# Patient Record
Sex: Male | Born: 1960
Health system: Southern US, Community
[De-identification: ages and names within clinical notes are randomized; demographics above are authoritative.]

## PROBLEM LIST (undated history)

## (undated) DIAGNOSIS — Z23 Encounter for immunization: Secondary | ICD-10-CM

## (undated) DIAGNOSIS — K219 Gastro-esophageal reflux disease without esophagitis: Secondary | ICD-10-CM

## (undated) DIAGNOSIS — B20 Human immunodeficiency virus [HIV] disease: Secondary | ICD-10-CM

## (undated) DIAGNOSIS — N200 Calculus of kidney: Secondary | ICD-10-CM

## (undated) DIAGNOSIS — I10 Essential (primary) hypertension: Secondary | ICD-10-CM

## (undated) DIAGNOSIS — Z951 Presence of aortocoronary bypass graft: Secondary | ICD-10-CM

## (undated) DIAGNOSIS — E7800 Pure hypercholesterolemia, unspecified: Secondary | ICD-10-CM

## (undated) DIAGNOSIS — I2510 Atherosclerotic heart disease of native coronary artery without angina pectoris: Secondary | ICD-10-CM

## (undated) DIAGNOSIS — F411 Generalized anxiety disorder: Secondary | ICD-10-CM

## (undated) HISTORY — DX: Encounter for immunization: Z23

## (undated) HISTORY — DX: Atherosclerotic heart disease of native coronary artery without angina pectoris: I25.10

## (undated) HISTORY — DX: Generalized anxiety disorder: F41.1

## (undated) HISTORY — DX: Calculus of kidney: N20.0

## (undated) HISTORY — PX: CORONARY ARTERY BYPASS GRAFT: SHX141

## (undated) HISTORY — DX: Presence of aortocoronary bypass graft: Z95.1

## (undated) HISTORY — DX: Gastro-esophageal reflux disease without esophagitis: K21.9

## (undated) HISTORY — DX: Human immunodeficiency virus (HIV) disease: B20

## (undated) HISTORY — DX: Essential (primary) hypertension: I10

## (undated) HISTORY — DX: Pure hypercholesterolemia, unspecified: E78.00

---

## 1995-07-14 ENCOUNTER — Encounter (INDEPENDENT_AMBULATORY_CARE_PROVIDER_SITE_OTHER): Payer: Self-pay | Admitting: *Deleted

## 1995-07-14 LAB — CONVERTED CEMR LAB
CD4 Count: 630 microliters
CD4 T Cell Abs: 630

## 1997-07-13 ENCOUNTER — Encounter: Admission: RE | Admit: 1997-07-13 | Discharge: 1997-07-13 | Payer: Self-pay | Admitting: Internal Medicine

## 1997-07-28 ENCOUNTER — Encounter: Admission: RE | Admit: 1997-07-28 | Discharge: 1997-07-28 | Payer: Self-pay | Admitting: Infectious Diseases

## 1997-11-10 ENCOUNTER — Encounter: Admission: RE | Admit: 1997-11-10 | Discharge: 1997-11-10 | Payer: Self-pay | Admitting: Infectious Diseases

## 1997-11-17 ENCOUNTER — Encounter: Admission: RE | Admit: 1997-11-17 | Discharge: 1997-11-17 | Payer: Self-pay | Admitting: Infectious Diseases

## 1998-01-03 ENCOUNTER — Ambulatory Visit (HOSPITAL_COMMUNITY): Admission: RE | Admit: 1998-01-03 | Discharge: 1998-01-03 | Payer: Self-pay | Admitting: Infectious Diseases

## 1998-01-03 ENCOUNTER — Encounter: Admission: RE | Admit: 1998-01-03 | Discharge: 1998-01-03 | Payer: Self-pay | Admitting: Infectious Diseases

## 1998-03-15 ENCOUNTER — Encounter: Admission: RE | Admit: 1998-03-15 | Discharge: 1998-03-15 | Payer: Self-pay | Admitting: Infectious Diseases

## 1998-09-13 ENCOUNTER — Ambulatory Visit (HOSPITAL_COMMUNITY): Admission: RE | Admit: 1998-09-13 | Discharge: 1998-09-13 | Payer: Self-pay | Admitting: Infectious Diseases

## 1999-07-01 ENCOUNTER — Encounter: Admission: RE | Admit: 1999-07-01 | Discharge: 1999-07-01 | Payer: Self-pay | Admitting: Infectious Diseases

## 1999-07-01 ENCOUNTER — Ambulatory Visit (HOSPITAL_COMMUNITY): Admission: RE | Admit: 1999-07-01 | Discharge: 1999-07-01 | Payer: Self-pay | Admitting: Infectious Diseases

## 1999-08-08 ENCOUNTER — Encounter: Admission: RE | Admit: 1999-08-08 | Discharge: 1999-08-08 | Payer: Self-pay | Admitting: Infectious Diseases

## 1999-11-13 ENCOUNTER — Encounter: Admission: RE | Admit: 1999-11-13 | Discharge: 1999-11-13 | Payer: Self-pay | Admitting: Infectious Diseases

## 1999-11-13 ENCOUNTER — Ambulatory Visit (HOSPITAL_COMMUNITY): Admission: RE | Admit: 1999-11-13 | Discharge: 1999-11-13 | Payer: Self-pay | Admitting: Infectious Diseases

## 1999-11-29 ENCOUNTER — Encounter: Admission: RE | Admit: 1999-11-29 | Discharge: 1999-11-29 | Payer: Self-pay | Admitting: Infectious Diseases

## 1999-12-30 ENCOUNTER — Inpatient Hospital Stay (HOSPITAL_COMMUNITY): Admission: EM | Admit: 1999-12-30 | Discharge: 2000-01-07 | Payer: Self-pay | Admitting: Emergency Medicine

## 1999-12-30 ENCOUNTER — Encounter: Payer: Self-pay | Admitting: Emergency Medicine

## 1999-12-31 ENCOUNTER — Encounter: Payer: Self-pay | Admitting: Cardiothoracic Surgery

## 2000-01-02 ENCOUNTER — Encounter: Payer: Self-pay | Admitting: Cardiothoracic Surgery

## 2000-01-03 ENCOUNTER — Encounter: Payer: Self-pay | Admitting: Cardiothoracic Surgery

## 2000-01-04 ENCOUNTER — Encounter: Payer: Self-pay | Admitting: Cardiothoracic Surgery

## 2000-01-06 ENCOUNTER — Encounter: Payer: Self-pay | Admitting: Cardiothoracic Surgery

## 2000-02-11 ENCOUNTER — Encounter (HOSPITAL_COMMUNITY): Admission: RE | Admit: 2000-02-11 | Discharge: 2000-05-11 | Payer: Self-pay | Admitting: Cardiovascular Disease

## 2000-04-01 ENCOUNTER — Encounter: Admission: RE | Admit: 2000-04-01 | Discharge: 2000-04-01 | Payer: Self-pay | Admitting: Infectious Diseases

## 2000-04-01 ENCOUNTER — Ambulatory Visit (HOSPITAL_COMMUNITY): Admission: RE | Admit: 2000-04-01 | Discharge: 2000-04-01 | Payer: Self-pay | Admitting: Infectious Diseases

## 2000-04-15 ENCOUNTER — Encounter: Admission: RE | Admit: 2000-04-15 | Discharge: 2000-04-15 | Payer: Self-pay | Admitting: Infectious Diseases

## 2000-09-21 ENCOUNTER — Encounter: Admission: RE | Admit: 2000-09-21 | Discharge: 2000-09-21 | Payer: Self-pay | Admitting: Infectious Diseases

## 2000-09-21 ENCOUNTER — Ambulatory Visit (HOSPITAL_COMMUNITY): Admission: RE | Admit: 2000-09-21 | Discharge: 2000-09-21 | Payer: Self-pay | Admitting: Infectious Diseases

## 2000-09-24 ENCOUNTER — Encounter: Admission: RE | Admit: 2000-09-24 | Discharge: 2000-09-24 | Payer: Self-pay | Admitting: Infectious Diseases

## 2001-05-07 ENCOUNTER — Encounter: Payer: Self-pay | Admitting: Cardiology

## 2001-05-07 ENCOUNTER — Emergency Department (HOSPITAL_COMMUNITY): Admission: EM | Admit: 2001-05-07 | Discharge: 2001-05-07 | Payer: Self-pay | Admitting: Emergency Medicine

## 2001-05-27 ENCOUNTER — Encounter: Admission: RE | Admit: 2001-05-27 | Discharge: 2001-05-27 | Payer: Self-pay | Admitting: Infectious Diseases

## 2001-05-27 ENCOUNTER — Ambulatory Visit (HOSPITAL_COMMUNITY): Admission: RE | Admit: 2001-05-27 | Discharge: 2001-05-27 | Payer: Self-pay | Admitting: Infectious Diseases

## 2001-08-25 ENCOUNTER — Encounter: Admission: RE | Admit: 2001-08-25 | Discharge: 2001-08-25 | Payer: Self-pay | Admitting: Internal Medicine

## 2001-08-25 ENCOUNTER — Ambulatory Visit (HOSPITAL_COMMUNITY): Admission: RE | Admit: 2001-08-25 | Discharge: 2001-08-25 | Payer: Self-pay | Admitting: Infectious Diseases

## 2001-09-09 ENCOUNTER — Encounter: Admission: RE | Admit: 2001-09-09 | Discharge: 2001-09-09 | Payer: Self-pay | Admitting: Infectious Diseases

## 2002-01-18 ENCOUNTER — Ambulatory Visit (HOSPITAL_COMMUNITY): Admission: RE | Admit: 2002-01-18 | Discharge: 2002-01-18 | Payer: Self-pay | Admitting: Infectious Diseases

## 2002-02-15 ENCOUNTER — Encounter: Admission: RE | Admit: 2002-02-15 | Discharge: 2002-02-15 | Payer: Self-pay | Admitting: Infectious Diseases

## 2002-03-10 HISTORY — PX: PTCA: SHX146

## 2002-05-09 ENCOUNTER — Encounter: Admission: RE | Admit: 2002-05-09 | Discharge: 2002-05-09 | Payer: Self-pay | Admitting: Infectious Diseases

## 2002-08-29 ENCOUNTER — Ambulatory Visit (HOSPITAL_COMMUNITY): Admission: RE | Admit: 2002-08-29 | Discharge: 2002-08-29 | Payer: Self-pay | Admitting: Infectious Diseases

## 2002-08-29 ENCOUNTER — Encounter: Payer: Self-pay | Admitting: Infectious Diseases

## 2002-09-23 ENCOUNTER — Encounter: Admission: RE | Admit: 2002-09-23 | Discharge: 2002-09-23 | Payer: Self-pay | Admitting: Infectious Diseases

## 2002-12-26 ENCOUNTER — Encounter: Payer: Self-pay | Admitting: Infectious Diseases

## 2002-12-26 ENCOUNTER — Encounter: Admission: RE | Admit: 2002-12-26 | Discharge: 2002-12-26 | Payer: Self-pay | Admitting: Infectious Diseases

## 2002-12-26 ENCOUNTER — Ambulatory Visit (HOSPITAL_COMMUNITY): Admission: RE | Admit: 2002-12-26 | Discharge: 2002-12-26 | Payer: Self-pay | Admitting: Infectious Diseases

## 2003-01-23 ENCOUNTER — Encounter: Admission: RE | Admit: 2003-01-23 | Discharge: 2003-01-23 | Payer: Self-pay | Admitting: Infectious Diseases

## 2003-03-27 ENCOUNTER — Encounter: Admission: RE | Admit: 2003-03-27 | Discharge: 2003-03-27 | Payer: Self-pay | Admitting: Infectious Diseases

## 2003-05-18 ENCOUNTER — Encounter: Admission: RE | Admit: 2003-05-18 | Discharge: 2003-05-18 | Payer: Self-pay | Admitting: Infectious Diseases

## 2003-10-05 ENCOUNTER — Ambulatory Visit (HOSPITAL_COMMUNITY): Admission: RE | Admit: 2003-10-05 | Discharge: 2003-10-05 | Payer: Self-pay | Admitting: Infectious Diseases

## 2003-10-05 ENCOUNTER — Encounter: Admission: RE | Admit: 2003-10-05 | Discharge: 2003-10-05 | Payer: Self-pay | Admitting: Infectious Diseases

## 2003-10-20 ENCOUNTER — Encounter: Admission: RE | Admit: 2003-10-20 | Discharge: 2003-10-20 | Payer: Self-pay | Admitting: Infectious Diseases

## 2004-02-08 ENCOUNTER — Ambulatory Visit (HOSPITAL_COMMUNITY): Admission: RE | Admit: 2004-02-08 | Discharge: 2004-02-08 | Payer: Self-pay | Admitting: Infectious Diseases

## 2004-02-08 ENCOUNTER — Ambulatory Visit: Payer: Self-pay | Admitting: Infectious Diseases

## 2004-02-22 ENCOUNTER — Ambulatory Visit: Payer: Self-pay | Admitting: Infectious Diseases

## 2004-06-24 ENCOUNTER — Ambulatory Visit: Payer: Self-pay | Admitting: Infectious Diseases

## 2004-06-24 ENCOUNTER — Ambulatory Visit: Payer: Self-pay | Admitting: Cardiovascular Disease

## 2004-06-24 ENCOUNTER — Ambulatory Visit (HOSPITAL_COMMUNITY): Admission: RE | Admit: 2004-06-24 | Discharge: 2004-06-24 | Payer: Self-pay | Admitting: Infectious Diseases

## 2004-07-01 ENCOUNTER — Ambulatory Visit: Payer: Self-pay | Admitting: Infectious Diseases

## 2004-08-06 ENCOUNTER — Ambulatory Visit: Payer: Self-pay | Admitting: Cardiovascular Disease

## 2004-08-06 ENCOUNTER — Ambulatory Visit: Payer: Self-pay

## 2004-09-17 ENCOUNTER — Encounter: Admission: RE | Admit: 2004-09-17 | Discharge: 2004-09-17 | Payer: Self-pay | Admitting: Family Medicine

## 2004-11-14 ENCOUNTER — Ambulatory Visit: Payer: Self-pay | Admitting: Infectious Diseases

## 2004-11-14 ENCOUNTER — Ambulatory Visit (HOSPITAL_COMMUNITY): Admission: RE | Admit: 2004-11-14 | Discharge: 2004-11-14 | Payer: Self-pay | Admitting: Infectious Diseases

## 2004-11-23 ENCOUNTER — Inpatient Hospital Stay (HOSPITAL_COMMUNITY): Admission: EM | Admit: 2004-11-23 | Discharge: 2004-11-25 | Payer: Self-pay | Admitting: Emergency Medicine

## 2004-11-24 ENCOUNTER — Ambulatory Visit: Payer: Self-pay | Admitting: Cardiology

## 2004-11-29 ENCOUNTER — Ambulatory Visit: Payer: Self-pay | Admitting: Infectious Diseases

## 2004-12-03 ENCOUNTER — Ambulatory Visit: Payer: Self-pay | Admitting: Cardiovascular Disease

## 2004-12-24 ENCOUNTER — Ambulatory Visit: Payer: Self-pay | Admitting: Cardiovascular Disease

## 2005-02-18 ENCOUNTER — Ambulatory Visit (HOSPITAL_COMMUNITY): Admission: EM | Admit: 2005-02-18 | Discharge: 2005-02-19 | Payer: Self-pay | Admitting: Emergency Medicine

## 2005-02-18 ENCOUNTER — Ambulatory Visit: Payer: Self-pay | Admitting: Cardiovascular Disease

## 2005-02-28 ENCOUNTER — Ambulatory Visit: Payer: Self-pay | Admitting: Internal Medicine

## 2005-03-27 ENCOUNTER — Ambulatory Visit: Payer: Self-pay | Admitting: Cardiovascular Disease

## 2005-03-27 ENCOUNTER — Ambulatory Visit: Payer: Self-pay | Admitting: Infectious Diseases

## 2005-03-28 ENCOUNTER — Ambulatory Visit (HOSPITAL_COMMUNITY): Admission: RE | Admit: 2005-03-28 | Discharge: 2005-03-28 | Payer: Self-pay | Admitting: Infectious Diseases

## 2005-05-26 ENCOUNTER — Ambulatory Visit: Payer: Self-pay | Admitting: Infectious Diseases

## 2005-06-19 ENCOUNTER — Encounter: Admission: RE | Admit: 2005-06-19 | Discharge: 2005-06-19 | Payer: Self-pay | Admitting: Infectious Diseases

## 2005-06-19 ENCOUNTER — Encounter (INDEPENDENT_AMBULATORY_CARE_PROVIDER_SITE_OTHER): Payer: Self-pay | Admitting: *Deleted

## 2005-06-19 ENCOUNTER — Ambulatory Visit: Payer: Self-pay | Admitting: Infectious Diseases

## 2005-06-19 LAB — CONVERTED CEMR LAB
CD4 Count: 1030 microliters
HIV 1 RNA Quant: 399 copies/mL

## 2005-07-01 ENCOUNTER — Ambulatory Visit: Payer: Self-pay | Admitting: Cardiovascular Disease

## 2005-07-03 ENCOUNTER — Ambulatory Visit: Payer: Self-pay | Admitting: Infectious Diseases

## 2005-08-22 ENCOUNTER — Ambulatory Visit: Payer: Self-pay | Admitting: Cardiology

## 2005-10-02 ENCOUNTER — Ambulatory Visit: Payer: Self-pay | Admitting: Infectious Diseases

## 2005-10-02 ENCOUNTER — Encounter: Admission: RE | Admit: 2005-10-02 | Discharge: 2005-10-02 | Payer: Self-pay | Admitting: Infectious Diseases

## 2005-10-02 ENCOUNTER — Encounter (INDEPENDENT_AMBULATORY_CARE_PROVIDER_SITE_OTHER): Payer: Self-pay | Admitting: *Deleted

## 2005-10-02 LAB — CONVERTED CEMR LAB
CD4 Count: 730 microliters
HIV 1 RNA Quant: 399 copies/mL

## 2005-10-16 ENCOUNTER — Ambulatory Visit: Payer: Self-pay | Admitting: Infectious Diseases

## 2005-10-28 ENCOUNTER — Ambulatory Visit (HOSPITAL_COMMUNITY): Admission: RE | Admit: 2005-10-28 | Discharge: 2005-10-28 | Payer: Self-pay | Admitting: Infectious Diseases

## 2005-12-09 ENCOUNTER — Ambulatory Visit: Payer: Self-pay

## 2006-01-06 ENCOUNTER — Ambulatory Visit: Payer: Self-pay | Admitting: Cardiovascular Disease

## 2006-02-05 ENCOUNTER — Ambulatory Visit: Payer: Self-pay | Admitting: Infectious Diseases

## 2006-03-16 ENCOUNTER — Ambulatory Visit: Payer: Self-pay | Admitting: Infectious Diseases

## 2006-03-16 ENCOUNTER — Encounter: Admission: RE | Admit: 2006-03-16 | Discharge: 2006-03-16 | Payer: Self-pay | Admitting: Infectious Diseases

## 2006-03-16 ENCOUNTER — Encounter (INDEPENDENT_AMBULATORY_CARE_PROVIDER_SITE_OTHER): Payer: Self-pay | Admitting: *Deleted

## 2006-03-16 LAB — CONVERTED CEMR LAB
ALT: 25 units/L (ref 0–53)
AST: 20 units/L (ref 0–37)
Albumin: 4.2 g/dL (ref 3.5–5.2)
Alkaline Phosphatase: 77 units/L (ref 39–117)
BUN: 14 mg/dL (ref 6–23)
Basophils Absolute: 0 10*3/uL (ref 0.0–0.1)
Basophils Relative: 0 % (ref 0–1)
Bilirubin Urine: NEGATIVE
CD4 Count: 800 microliters
CO2: 24 meq/L (ref 19–32)
Calcium: 9.8 mg/dL (ref 8.4–10.5)
Chloride: 105 meq/L (ref 96–112)
Cholesterol: 209 mg/dL — ABNORMAL HIGH (ref 0–200)
Creatinine, Ser: 0.9 mg/dL (ref 0.40–1.50)
Eosinophils Relative: 2 % (ref 0–5)
Glucose, Bld: 75 mg/dL (ref 70–99)
HCT: 46.4 % (ref 39.0–52.0)
HDL: 41 mg/dL (ref >39)
HIV 1 RNA Quant: 67 copies/mL
HIV 1 RNA Quant: 67 copies/mL — ABNORMAL HIGH (ref <50)
HIV-1 RNA Quant, Log: 1.83 — ABNORMAL HIGH (ref <1.70)
Hemoglobin, Urine: NEGATIVE
Hemoglobin: 15.8 g/dL (ref 13.0–17.0)
Ketones, ur: NEGATIVE mg/dL
LDL Cholesterol: 127 mg/dL — ABNORMAL HIGH (ref 0–99)
Leukocytes, UA: NEGATIVE
Lymphocytes Relative: 33 % (ref 12–46)
Lymphs Abs: 1.9 10*3/uL (ref 0.7–3.3)
MCHC: 34.1 g/dL (ref 30.0–36.0)
MCV: 99.8 fL (ref 78.0–100.0)
Monocytes Absolute: 0.4 10*3/uL (ref 0.2–0.7)
Monocytes Relative: 7 % (ref 3–11)
Neutro Abs: 3.4 10*3/uL (ref 1.7–7.7)
Neutrophils Relative %: 58 % (ref 43–77)
Nitrite: NEGATIVE
Platelets: 227 10*3/uL (ref 150–400)
Potassium: 4.7 meq/L (ref 3.5–5.3)
Protein, ur: NEGATIVE mg/dL
RBC: 4.65 M/uL (ref 4.22–5.81)
RDW: 13.1 % (ref 11.5–14.0)
Sodium: 141 meq/L (ref 135–145)
Specific Gravity, Urine: 1.015 (ref 1.005–1.03)
Total Bilirubin: 0.4 mg/dL (ref 0.3–1.2)
Total CHOL/HDL Ratio: 5.1
Total Protein: 7.2 g/dL (ref 6.0–8.3)
Triglycerides: 204 mg/dL — ABNORMAL HIGH (ref <150)
Urine Glucose: NEGATIVE mg/dL
Urobilinogen, UA: 0.2 (ref 0.0–1.0)
VLDL: 41 mg/dL — ABNORMAL HIGH (ref 0–40)
WBC: 5.8 10*3/uL (ref 4.0–10.5)
pH: 6.5 (ref 5.0–8.0)

## 2006-03-20 DIAGNOSIS — I2510 Atherosclerotic heart disease of native coronary artery without angina pectoris: Secondary | ICD-10-CM | POA: Insufficient documentation

## 2006-03-20 DIAGNOSIS — B20 Human immunodeficiency virus [HIV] disease: Secondary | ICD-10-CM | POA: Insufficient documentation

## 2006-03-30 ENCOUNTER — Encounter: Payer: Self-pay | Admitting: Infectious Diseases

## 2006-04-01 ENCOUNTER — Encounter: Admission: RE | Admit: 2006-04-01 | Discharge: 2006-04-01 | Payer: Self-pay | Admitting: Family Medicine

## 2006-04-16 ENCOUNTER — Ambulatory Visit: Payer: Self-pay | Admitting: Infectious Diseases

## 2006-05-04 ENCOUNTER — Encounter (INDEPENDENT_AMBULATORY_CARE_PROVIDER_SITE_OTHER): Payer: Self-pay | Admitting: *Deleted

## 2006-05-04 LAB — CONVERTED CEMR LAB

## 2006-05-17 ENCOUNTER — Encounter (INDEPENDENT_AMBULATORY_CARE_PROVIDER_SITE_OTHER): Payer: Self-pay | Admitting: *Deleted

## 2006-06-10 ENCOUNTER — Telehealth: Payer: Self-pay | Admitting: Infectious Diseases

## 2006-06-11 ENCOUNTER — Ambulatory Visit: Payer: Self-pay | Admitting: Cardiovascular Disease

## 2006-07-08 ENCOUNTER — Ambulatory Visit: Payer: Self-pay | Admitting: Infectious Diseases

## 2006-07-08 ENCOUNTER — Encounter: Admission: RE | Admit: 2006-07-08 | Discharge: 2006-07-08 | Payer: Self-pay | Admitting: Infectious Diseases

## 2006-07-08 ENCOUNTER — Encounter (INDEPENDENT_AMBULATORY_CARE_PROVIDER_SITE_OTHER): Payer: Self-pay | Admitting: *Deleted

## 2006-07-14 ENCOUNTER — Ambulatory Visit: Payer: Self-pay | Admitting: Infectious Diseases

## 2006-10-06 ENCOUNTER — Encounter: Admission: RE | Admit: 2006-10-06 | Discharge: 2006-10-06 | Payer: Self-pay | Admitting: Infectious Diseases

## 2006-10-06 ENCOUNTER — Ambulatory Visit: Payer: Self-pay | Admitting: Infectious Diseases

## 2006-10-06 LAB — CONVERTED CEMR LAB
ALT: 30 units/L (ref 0–53)
AST: 23 units/L (ref 0–37)
Albumin: 4.4 g/dL (ref 3.5–5.2)
Alkaline Phosphatase: 90 units/L (ref 39–117)
BUN: 17 mg/dL (ref 6–23)
Basophils Absolute: 0 10*3/uL (ref 0.0–0.1)
Basophils Relative: 0 % (ref 0–1)
CO2: 22 meq/L (ref 19–32)
Calcium: 9.6 mg/dL (ref 8.4–10.5)
Chloride: 103 meq/L (ref 96–112)
Creatinine, Ser: 0.98 mg/dL (ref 0.40–1.50)
Eosinophils Absolute: 0 10*3/uL (ref 0.0–0.7)
Eosinophils Relative: 0 % (ref 0–5)
Glucose, Bld: 88 mg/dL (ref 70–99)
HCT: 49.4 % (ref 39.0–52.0)
HIV 1 RNA Quant: <50 copies/mL (ref <50)
HIV-1 RNA Quant, Log: <1.7 (ref <1.70)
Hemoglobin: 17.1 g/dL — ABNORMAL HIGH (ref 13.0–17.0)
Lymphocytes Relative: 32 % (ref 12–46)
Lymphs Abs: 2.2 10*3/uL (ref 0.7–3.3)
MCHC: 34.6 g/dL (ref 30.0–36.0)
MCV: 98.4 fL (ref 78.0–100.0)
Monocytes Absolute: 0.5 10*3/uL (ref 0.2–0.7)
Monocytes Relative: 7 % (ref 3–11)
Neutro Abs: 4.3 10*3/uL (ref 1.7–7.7)
Neutrophils Relative %: 61 % (ref 43–77)
Platelets: 228 10*3/uL (ref 150–400)
Potassium: 4.6 meq/L (ref 3.5–5.3)
RBC: 5.02 M/uL (ref 4.22–5.81)
RDW: 13.1 % (ref 11.5–14.0)
Sodium: 139 meq/L (ref 135–145)
Total Bilirubin: 0.6 mg/dL (ref 0.3–1.2)
Total Protein: 7.8 g/dL (ref 6.0–8.3)
WBC: 7 10*3/uL (ref 4.0–10.5)

## 2006-10-09 ENCOUNTER — Encounter: Admission: RE | Admit: 2006-10-09 | Discharge: 2006-10-09 | Payer: Self-pay | Admitting: Family Medicine

## 2006-10-22 ENCOUNTER — Ambulatory Visit: Payer: Self-pay | Admitting: Infectious Diseases

## 2007-01-13 ENCOUNTER — Telehealth: Payer: Self-pay | Admitting: Infectious Diseases

## 2007-01-18 ENCOUNTER — Ambulatory Visit: Payer: Self-pay | Admitting: Cardiovascular Disease

## 2007-02-03 ENCOUNTER — Ambulatory Visit: Payer: Self-pay | Admitting: Internal Medicine

## 2007-02-03 ENCOUNTER — Encounter: Admission: RE | Admit: 2007-02-03 | Discharge: 2007-02-03 | Payer: Self-pay | Admitting: Infectious Diseases

## 2007-02-03 ENCOUNTER — Encounter: Payer: Self-pay | Admitting: Infectious Diseases

## 2007-02-03 LAB — CONVERTED CEMR LAB
ALT: 32 units/L (ref 0–53)
AST: 27 units/L (ref 0–37)
Albumin: 4.2 g/dL (ref 3.5–5.2)
Alkaline Phosphatase: 86 units/L (ref 39–117)
BUN: 14 mg/dL (ref 6–23)
Basophils Absolute: 0 10*3/uL (ref 0.0–0.1)
Basophils Relative: 0 % (ref 0–1)
CO2: 23 meq/L (ref 19–32)
Calcium: 9.6 mg/dL (ref 8.4–10.5)
Chloride: 104 meq/L (ref 96–112)
Creatinine, Ser: 0.93 mg/dL (ref 0.40–1.50)
Eosinophils Absolute: 0.1 10*3/uL — ABNORMAL LOW (ref 0.2–0.7)
Eosinophils Relative: 1 % (ref 0–5)
Glucose, Bld: 84 mg/dL (ref 70–99)
HCT: 49 % (ref 39.0–52.0)
HIV 1 RNA Quant: 94 copies/mL — ABNORMAL HIGH (ref <50)
HIV-1 RNA Quant, Log: 1.97 — ABNORMAL HIGH (ref <1.70)
Hemoglobin: 16.5 g/dL (ref 13.0–17.0)
Lymphocytes Relative: 28 % (ref 12–46)
Lymphs Abs: 2.9 10*3/uL (ref 0.7–4.0)
MCHC: 33.7 g/dL (ref 30.0–36.0)
MCV: 98.6 fL (ref 78.0–100.0)
Monocytes Absolute: 0.6 10*3/uL (ref 0.1–1.0)
Monocytes Relative: 6 % (ref 3–12)
Neutro Abs: 6.7 10*3/uL (ref 1.7–7.7)
Neutrophils Relative %: 65 % (ref 43–77)
Platelets: 217 10*3/uL (ref 150–400)
Potassium: 4.3 meq/L (ref 3.5–5.3)
RBC: 4.97 M/uL (ref 4.22–5.81)
RDW: 13.1 % (ref 11.5–15.5)
Sodium: 140 meq/L (ref 135–145)
Total Bilirubin: 0.4 mg/dL (ref 0.3–1.2)
Total Protein: 7.1 g/dL (ref 6.0–8.3)
WBC: 10.4 10*3/uL (ref 4.0–10.5)

## 2007-02-25 ENCOUNTER — Encounter (INDEPENDENT_AMBULATORY_CARE_PROVIDER_SITE_OTHER): Payer: Self-pay | Admitting: Licensed Clinical Social Worker

## 2007-02-25 ENCOUNTER — Ambulatory Visit: Payer: Self-pay | Admitting: Infectious Diseases

## 2007-02-26 ENCOUNTER — Encounter: Admission: RE | Admit: 2007-02-26 | Discharge: 2007-02-26 | Payer: Self-pay | Admitting: Infectious Diseases

## 2007-02-26 ENCOUNTER — Ambulatory Visit: Payer: Self-pay | Admitting: Infectious Diseases

## 2007-02-26 LAB — CONVERTED CEMR LAB
HIV 1 RNA Quant: <50 copies/mL (ref <50)
HIV-1 RNA Quant, Log: <1.7 (ref <1.70)

## 2007-03-01 ENCOUNTER — Telehealth (INDEPENDENT_AMBULATORY_CARE_PROVIDER_SITE_OTHER): Payer: Self-pay | Admitting: Licensed Clinical Social Worker

## 2007-03-09 ENCOUNTER — Encounter: Payer: Self-pay | Admitting: Infectious Diseases

## 2007-03-10 ENCOUNTER — Encounter (INDEPENDENT_AMBULATORY_CARE_PROVIDER_SITE_OTHER): Payer: Self-pay | Admitting: *Deleted

## 2007-07-16 ENCOUNTER — Telehealth: Payer: Self-pay | Admitting: Infectious Diseases

## 2007-09-09 ENCOUNTER — Ambulatory Visit: Payer: Self-pay | Admitting: Infectious Diseases

## 2007-09-09 ENCOUNTER — Encounter: Admission: RE | Admit: 2007-09-09 | Discharge: 2007-09-09 | Payer: Self-pay | Admitting: Infectious Diseases

## 2007-09-09 LAB — CONVERTED CEMR LAB
ALT: 36 units/L (ref 0–53)
AST: 26 units/L (ref 0–37)
Albumin: 4.2 g/dL (ref 3.5–5.2)
Alkaline Phosphatase: 82 units/L (ref 39–117)
BUN: 19 mg/dL (ref 6–23)
Basophils Absolute: 0 10*3/uL (ref 0.0–0.1)
Basophils Relative: 0 % (ref 0–1)
CO2: 24 meq/L (ref 19–32)
Calcium: 9.6 mg/dL (ref 8.4–10.5)
Chloride: 104 meq/L (ref 96–112)
Creatinine, Ser: 1 mg/dL (ref 0.40–1.50)
Eosinophils Absolute: 0 10*3/uL (ref 0.0–0.7)
Eosinophils Relative: 0 % (ref 0–5)
Glucose, Bld: 106 mg/dL — ABNORMAL HIGH (ref 70–99)
HCT: 45.9 % (ref 39.0–52.0)
HIV 1 RNA Quant: <50 copies/mL (ref <50)
HIV-1 RNA Quant, Log: <1.7 (ref <1.70)
Hemoglobin: 16.1 g/dL (ref 13.0–17.0)
Lymphocytes Relative: 32 % (ref 12–46)
Lymphs Abs: 2.5 10*3/uL (ref 0.7–4.0)
MCHC: 35.1 g/dL (ref 30.0–36.0)
MCV: 98.9 fL (ref 78.0–100.0)
Monocytes Absolute: 0.6 10*3/uL (ref 0.1–1.0)
Monocytes Relative: 8 % (ref 3–12)
Neutro Abs: 4.6 10*3/uL (ref 1.7–7.7)
Neutrophils Relative %: 60 % (ref 43–77)
Platelets: 239 10*3/uL (ref 150–400)
Potassium: 4.4 meq/L (ref 3.5–5.3)
RBC: 4.64 M/uL (ref 4.22–5.81)
RDW: 13.1 % (ref 11.5–15.5)
Sodium: 135 meq/L (ref 135–145)
Total Bilirubin: 0.6 mg/dL (ref 0.3–1.2)
Total Protein: 7.5 g/dL (ref 6.0–8.3)
WBC: 7.7 10*3/uL (ref 4.0–10.5)

## 2007-09-23 ENCOUNTER — Telehealth (INDEPENDENT_AMBULATORY_CARE_PROVIDER_SITE_OTHER): Payer: Self-pay | Admitting: *Deleted

## 2007-09-29 ENCOUNTER — Ambulatory Visit: Payer: Self-pay | Admitting: Infectious Diseases

## 2007-09-29 LAB — CONVERTED CEMR LAB
Bilirubin Urine: NEGATIVE
Chlamydia, Swab/Urine, PCR: NEGATIVE
GC Probe Amp, Urine: NEGATIVE
Hemoglobin, Urine: NEGATIVE
Ketones, ur: NEGATIVE mg/dL
Leukocytes, UA: NEGATIVE
Nitrite: NEGATIVE
Protein, ur: NEGATIVE mg/dL
Specific Gravity, Urine: 1.025 (ref 1.005–1.03)
Urine Glucose: NEGATIVE mg/dL
Urobilinogen, UA: 0.2 (ref 0.0–1.0)
pH: 5.5 (ref 5.0–8.0)

## 2007-10-04 ENCOUNTER — Telehealth (INDEPENDENT_AMBULATORY_CARE_PROVIDER_SITE_OTHER): Payer: Self-pay | Admitting: *Deleted

## 2007-11-02 ENCOUNTER — Ambulatory Visit: Payer: Self-pay | Admitting: Cardiovascular Disease

## 2007-11-04 ENCOUNTER — Telehealth (INDEPENDENT_AMBULATORY_CARE_PROVIDER_SITE_OTHER): Payer: Self-pay | Admitting: *Deleted

## 2008-02-29 ENCOUNTER — Ambulatory Visit: Payer: Self-pay | Admitting: Infectious Diseases

## 2008-02-29 LAB — CONVERTED CEMR LAB
ALT: 29 units/L (ref 0–53)
AST: 21 units/L (ref 0–37)
Albumin: 4.1 g/dL (ref 3.5–5.2)
Alkaline Phosphatase: 91 units/L (ref 39–117)
BUN: 13 mg/dL (ref 6–23)
Basophils Absolute: 0 10*3/uL (ref 0.0–0.1)
Basophils Relative: 0 % (ref 0–1)
CO2: 22 meq/L (ref 19–32)
Calcium: 9 mg/dL (ref 8.4–10.5)
Chloride: 101 meq/L (ref 96–112)
Creatinine, Ser: 0.85 mg/dL (ref 0.40–1.50)
Eosinophils Absolute: 0 10*3/uL (ref 0.0–0.7)
Eosinophils Relative: 0 % (ref 0–5)
Glucose, Bld: 87 mg/dL (ref 70–99)
HCT: 48.4 % (ref 39.0–52.0)
HIV 1 RNA Quant: 143 copies/mL — ABNORMAL HIGH (ref <48)
HIV-1 RNA Quant, Log: 2.16 — ABNORMAL HIGH (ref <1.68)
Hemoglobin: 17 g/dL (ref 13.0–17.0)
Hep A Total Ab: NEGATIVE
Lymphocytes Relative: 34 % (ref 12–46)
Lymphs Abs: 2.8 10*3/uL (ref 0.7–4.0)
MCHC: 35.1 g/dL (ref 30.0–36.0)
MCV: 97.8 fL (ref 78.0–100.0)
Monocytes Absolute: 0.8 10*3/uL (ref 0.1–1.0)
Monocytes Relative: 9 % (ref 3–12)
Neutro Abs: 4.6 10*3/uL (ref 1.7–7.7)
Neutrophils Relative %: 56 % (ref 43–77)
Platelets: 238 10*3/uL (ref 150–400)
Potassium: 4.7 meq/L (ref 3.5–5.3)
RBC: 4.95 M/uL (ref 4.22–5.81)
RDW: 12.9 % (ref 11.5–15.5)
Sodium: 137 meq/L (ref 135–145)
Total Bilirubin: 0.5 mg/dL (ref 0.3–1.2)
Total Protein: 7.2 g/dL (ref 6.0–8.3)
WBC: 8.3 10*3/uL (ref 4.0–10.5)

## 2008-03-16 ENCOUNTER — Ambulatory Visit: Payer: Self-pay | Admitting: Infectious Diseases

## 2008-08-17 ENCOUNTER — Ambulatory Visit: Payer: Self-pay | Admitting: Infectious Diseases

## 2008-08-17 LAB — CONVERTED CEMR LAB
ALT: 49 units/L (ref 0–53)
AST: 41 units/L — ABNORMAL HIGH (ref 0–37)
Albumin: 4.3 g/dL (ref 3.5–5.2)
Alkaline Phosphatase: 81 units/L (ref 39–117)
BUN: 12 mg/dL (ref 6–23)
Basophils Absolute: 0 10*3/uL (ref 0.0–0.1)
Basophils Relative: 0 % (ref 0–1)
CO2: 23 meq/L (ref 19–32)
Calcium: 9.3 mg/dL (ref 8.4–10.5)
Chloride: 105 meq/L (ref 96–112)
Cholesterol: 200 mg/dL (ref 0–200)
Creatinine, Ser: 0.89 mg/dL (ref 0.40–1.50)
Eosinophils Absolute: 0 10*3/uL (ref 0.0–0.7)
Eosinophils Relative: 1 % (ref 0–5)
GFR calc Af Amer: >60 mL/min (ref >60)
GFR calc non Af Amer: >60 mL/min (ref >60)
Glucose, Bld: 92 mg/dL (ref 70–99)
HCT: 45.6 % (ref 39.0–52.0)
HDL: 52 mg/dL (ref >39)
HIV 1 RNA Quant: 81 copies/mL — ABNORMAL HIGH (ref <48)
HIV-1 RNA Quant, Log: 1.91 — ABNORMAL HIGH (ref <1.68)
Hemoglobin: 16.6 g/dL (ref 13.0–17.0)
LDL Cholesterol: 107 mg/dL — ABNORMAL HIGH (ref 0–99)
Lymphocytes Relative: 29 % (ref 12–46)
Lymphs Abs: 2.1 10*3/uL (ref 0.7–4.0)
MCHC: 36.4 g/dL — ABNORMAL HIGH (ref 30.0–36.0)
MCV: 96.4 fL (ref 78.0–100.0)
Monocytes Absolute: 0.5 10*3/uL (ref 0.1–1.0)
Monocytes Relative: 7 % (ref 3–12)
Neutro Abs: 4.6 10*3/uL (ref 1.7–7.7)
Neutrophils Relative %: 63 % (ref 43–77)
Platelets: 218 10*3/uL (ref 150–400)
Potassium: 5 meq/L (ref 3.5–5.3)
RBC: 4.73 M/uL (ref 4.22–5.81)
RDW: 13 % (ref 11.5–15.5)
Sodium: 138 meq/L (ref 135–145)
Total Bilirubin: 0.5 mg/dL (ref 0.3–1.2)
Total CHOL/HDL Ratio: 3.8
Total Protein: 7.4 g/dL (ref 6.0–8.3)
Triglycerides: 206 mg/dL — ABNORMAL HIGH (ref <150)
VLDL: 41 mg/dL — ABNORMAL HIGH (ref 0–40)
WBC: 7.2 10*3/uL (ref 4.0–10.5)

## 2008-09-06 ENCOUNTER — Telehealth (INDEPENDENT_AMBULATORY_CARE_PROVIDER_SITE_OTHER): Payer: Self-pay | Admitting: *Deleted

## 2008-10-03 ENCOUNTER — Telehealth (INDEPENDENT_AMBULATORY_CARE_PROVIDER_SITE_OTHER): Payer: Self-pay | Admitting: *Deleted

## 2008-11-03 DIAGNOSIS — E7800 Pure hypercholesterolemia, unspecified: Secondary | ICD-10-CM | POA: Insufficient documentation

## 2008-11-03 DIAGNOSIS — N200 Calculus of kidney: Secondary | ICD-10-CM | POA: Insufficient documentation

## 2008-11-03 DIAGNOSIS — F411 Generalized anxiety disorder: Secondary | ICD-10-CM | POA: Insufficient documentation

## 2008-11-03 DIAGNOSIS — I10 Essential (primary) hypertension: Secondary | ICD-10-CM | POA: Insufficient documentation

## 2008-11-03 DIAGNOSIS — K219 Gastro-esophageal reflux disease without esophagitis: Secondary | ICD-10-CM | POA: Insufficient documentation

## 2008-11-06 ENCOUNTER — Ambulatory Visit: Payer: Self-pay | Admitting: Cardiovascular Disease

## 2008-11-06 ENCOUNTER — Telehealth: Payer: Self-pay | Admitting: Infectious Diseases

## 2008-11-06 DIAGNOSIS — R05 Cough: Secondary | ICD-10-CM | POA: Insufficient documentation

## 2008-11-06 DIAGNOSIS — F17200 Nicotine dependence, unspecified, uncomplicated: Secondary | ICD-10-CM | POA: Insufficient documentation

## 2008-11-06 DIAGNOSIS — R059 Cough, unspecified: Secondary | ICD-10-CM | POA: Insufficient documentation

## 2008-11-29 ENCOUNTER — Ambulatory Visit: Payer: Self-pay | Admitting: Infectious Diseases

## 2008-11-29 LAB — CONVERTED CEMR LAB
ALT: 33 units/L (ref 0–53)
AST: 25 units/L (ref 0–37)
Albumin: 4.6 g/dL (ref 3.5–5.2)
Alkaline Phosphatase: 86 units/L (ref 39–117)
BUN: 13 mg/dL (ref 6–23)
Basophils Absolute: 0 10*3/uL (ref 0.0–0.1)
Basophils Relative: 0 % (ref 0–1)
CO2: 24 meq/L (ref 19–32)
Calcium: 9.9 mg/dL (ref 8.4–10.5)
Chloride: 102 meq/L (ref 96–112)
Creatinine, Ser: 0.94 mg/dL (ref 0.40–1.50)
Eosinophils Absolute: 0.1 10*3/uL (ref 0.0–0.7)
Eosinophils Relative: 1 % (ref 0–5)
Glucose, Bld: 119 mg/dL — ABNORMAL HIGH (ref 70–99)
HCT: 48.9 % (ref 39.0–52.0)
HIV 1 RNA Quant: 149 copies/mL — ABNORMAL HIGH (ref <48)
HIV-1 RNA Quant, Log: 2.17 — ABNORMAL HIGH (ref <1.68)
Hemoglobin: 17.1 g/dL — ABNORMAL HIGH (ref 13.0–17.0)
Lymphocytes Relative: 26 % (ref 12–46)
Lymphs Abs: 2.1 10*3/uL (ref 0.7–4.0)
MCHC: 35 g/dL (ref 30.0–36.0)
MCV: 101.2 fL — ABNORMAL HIGH (ref >=78.0)
Monocytes Absolute: 0.5 10*3/uL (ref 0.1–1.0)
Monocytes Relative: 6 % (ref 3–12)
Neutro Abs: 5.4 10*3/uL (ref 1.7–7.7)
Neutrophils Relative %: 67 % (ref 43–77)
Platelets: 225 10*3/uL (ref 150–400)
Potassium: 5 meq/L (ref 3.5–5.3)
RBC: 4.83 M/uL (ref 4.22–5.81)
RDW: 12.7 % (ref 11.5–15.5)
Sodium: 138 meq/L (ref 135–145)
Total Bilirubin: 0.6 mg/dL (ref 0.3–1.2)
Total Protein: 7.3 g/dL (ref 6.0–8.3)
WBC: 8 10*3/uL (ref 4.0–10.5)

## 2008-12-07 ENCOUNTER — Encounter: Payer: Self-pay | Admitting: Infectious Diseases

## 2008-12-11 ENCOUNTER — Telehealth: Payer: Self-pay

## 2008-12-21 ENCOUNTER — Telehealth (INDEPENDENT_AMBULATORY_CARE_PROVIDER_SITE_OTHER): Payer: Self-pay | Admitting: *Deleted

## 2008-12-21 ENCOUNTER — Ambulatory Visit: Payer: Self-pay | Admitting: Internal Medicine

## 2009-03-15 ENCOUNTER — Encounter (INDEPENDENT_AMBULATORY_CARE_PROVIDER_SITE_OTHER): Payer: Self-pay | Admitting: *Deleted

## 2009-03-23 ENCOUNTER — Ambulatory Visit: Payer: Self-pay | Admitting: Infectious Diseases

## 2009-03-23 LAB — CONVERTED CEMR LAB
ALT: 34 units/L (ref 0–53)
AST: 22 units/L (ref 0–37)
Albumin: 4.1 g/dL (ref 3.5–5.2)
Alkaline Phosphatase: 73 units/L (ref 39–117)
BUN: 13 mg/dL (ref 6–23)
Basophils Absolute: 0 10*3/uL (ref 0.0–0.1)
Basophils Relative: 0 % (ref 0–1)
CO2: 24 meq/L (ref 19–32)
Calcium: 9.2 mg/dL (ref 8.4–10.5)
Chloride: 103 meq/L (ref 96–112)
Cholesterol: 202 mg/dL — ABNORMAL HIGH (ref 0–200)
Creatinine, Ser: 0.83 mg/dL (ref 0.40–1.50)
Eosinophils Absolute: 0 10*3/uL (ref 0.0–0.7)
Eosinophils Relative: 1 % (ref 0–5)
Glucose, Bld: 116 mg/dL — ABNORMAL HIGH (ref 70–99)
HCT: 49.3 % (ref 39.0–52.0)
HDL: 51 mg/dL (ref >39)
HIV 1 RNA Quant: 54 copies/mL — ABNORMAL HIGH (ref <48)
HIV-1 RNA Quant, Log: 1.73 — ABNORMAL HIGH (ref <1.68)
Hemoglobin: 17.2 g/dL — ABNORMAL HIGH (ref 13.0–17.0)
LDL Cholesterol: 105 mg/dL — ABNORMAL HIGH (ref 0–99)
Lymphocytes Relative: 29 % (ref 12–46)
Lymphs Abs: 2 10*3/uL (ref 0.7–4.0)
MCHC: 34.9 g/dL (ref 30.0–36.0)
MCV: 102.1 fL — ABNORMAL HIGH (ref >=78.0)
Monocytes Absolute: 0.5 10*3/uL (ref 0.1–1.0)
Monocytes Relative: 7 % (ref 3–12)
Neutro Abs: 4.5 10*3/uL (ref 1.7–7.7)
Neutrophils Relative %: 64 % (ref 43–77)
Platelets: 222 10*3/uL (ref 150–400)
Potassium: 4.6 meq/L (ref 3.5–5.3)
RBC: 4.83 M/uL (ref 4.22–5.81)
RDW: 12.8 % (ref 11.5–15.5)
Sodium: 138 meq/L (ref 135–145)
Total Bilirubin: 0.5 mg/dL (ref 0.3–1.2)
Total CHOL/HDL Ratio: 4
Total Protein: 6.8 g/dL (ref 6.0–8.3)
Triglycerides: 232 mg/dL — ABNORMAL HIGH (ref <150)
VLDL: 46 mg/dL — ABNORMAL HIGH (ref 0–40)
WBC: 7.1 10*3/uL (ref 4.0–10.5)

## 2009-03-29 ENCOUNTER — Ambulatory Visit: Payer: Self-pay | Admitting: Infectious Diseases

## 2009-07-05 ENCOUNTER — Telehealth (INDEPENDENT_AMBULATORY_CARE_PROVIDER_SITE_OTHER): Payer: Self-pay | Admitting: *Deleted

## 2009-08-14 ENCOUNTER — Ambulatory Visit: Payer: Self-pay | Admitting: Infectious Diseases

## 2009-08-14 LAB — CONVERTED CEMR LAB
ALT: 30 units/L (ref 0–53)
AST: 31 units/L (ref 0–37)
Albumin: 4.4 g/dL (ref 3.5–5.2)
Alkaline Phosphatase: 91 units/L (ref 39–117)
BUN: 16 mg/dL (ref 6–23)
Basophils Absolute: 0 10*3/uL (ref 0.0–0.1)
Basophils Relative: 0 % (ref 0–1)
CO2: 19 meq/L (ref 19–32)
Calcium: 9.7 mg/dL (ref 8.4–10.5)
Chloride: 99 meq/L (ref 96–112)
Creatinine, Ser: 0.88 mg/dL (ref 0.40–1.50)
Eosinophils Absolute: 0 10*3/uL (ref 0.0–0.7)
Eosinophils Relative: 1 % (ref 0–5)
Glucose, Bld: 92 mg/dL (ref 70–99)
HCT: 49.6 % (ref 39.0–52.0)
HIV 1 RNA Quant: <48 copies/mL (ref <48)
HIV-1 RNA Quant, Log: <1.68 (ref <1.68)
Hemoglobin: 17.5 g/dL — ABNORMAL HIGH (ref 13.0–17.0)
Lymphocytes Relative: 26 % (ref 12–46)
Lymphs Abs: 2.2 10*3/uL (ref 0.7–4.0)
MCHC: 35.3 g/dL (ref 30.0–36.0)
MCV: 101.2 fL — ABNORMAL HIGH (ref 78.0–100.0)
Monocytes Absolute: 0.6 10*3/uL (ref 0.1–1.0)
Monocytes Relative: 7 % (ref 3–12)
Neutro Abs: 5.4 10*3/uL (ref 1.7–7.7)
Neutrophils Relative %: 66 % (ref 43–77)
Platelets: 248 10*3/uL (ref 150–400)
Potassium: 4.5 meq/L (ref 3.5–5.3)
RBC: 4.9 M/uL (ref 4.22–5.81)
RDW: 12.7 % (ref 11.5–15.5)
Sodium: 133 meq/L — ABNORMAL LOW (ref 135–145)
Total Bilirubin: 0.6 mg/dL (ref 0.3–1.2)
Total Protein: 7.7 g/dL (ref 6.0–8.3)
WBC: 8.2 10*3/uL (ref 4.0–10.5)

## 2009-08-16 ENCOUNTER — Telehealth (INDEPENDENT_AMBULATORY_CARE_PROVIDER_SITE_OTHER): Payer: Self-pay | Admitting: *Deleted

## 2009-08-30 ENCOUNTER — Ambulatory Visit: Payer: Self-pay | Admitting: Infectious Diseases

## 2009-09-07 ENCOUNTER — Encounter (INDEPENDENT_AMBULATORY_CARE_PROVIDER_SITE_OTHER): Payer: Self-pay | Admitting: *Deleted

## 2009-09-26 ENCOUNTER — Telehealth (INDEPENDENT_AMBULATORY_CARE_PROVIDER_SITE_OTHER): Payer: Self-pay | Admitting: *Deleted

## 2009-12-19 ENCOUNTER — Inpatient Hospital Stay (HOSPITAL_COMMUNITY): Admission: EM | Admit: 2009-12-19 | Discharge: 2010-01-02 | Payer: Self-pay | Admitting: Emergency Medicine

## 2009-12-19 ENCOUNTER — Encounter: Payer: Self-pay | Admitting: Cardiovascular Disease

## 2009-12-19 ENCOUNTER — Encounter (INDEPENDENT_AMBULATORY_CARE_PROVIDER_SITE_OTHER): Payer: Self-pay | Admitting: Cardiology

## 2009-12-19 ENCOUNTER — Ambulatory Visit: Payer: Self-pay | Admitting: Thoracic Surgery (Cardiothoracic Vascular Surgery)

## 2009-12-19 ENCOUNTER — Ambulatory Visit: Payer: Self-pay | Admitting: Cardiovascular Disease

## 2009-12-20 ENCOUNTER — Encounter: Payer: Self-pay | Admitting: Thoracic Surgery (Cardiothoracic Vascular Surgery)

## 2009-12-28 ENCOUNTER — Encounter: Payer: Self-pay | Admitting: Cardiovascular Disease

## 2009-12-28 ENCOUNTER — Encounter: Payer: Self-pay | Admitting: Cardiothoracic Surgery

## 2009-12-28 DIAGNOSIS — Z951 Presence of aortocoronary bypass graft: Secondary | ICD-10-CM | POA: Insufficient documentation

## 2009-12-28 HISTORY — DX: Presence of aortocoronary bypass graft: Z95.1

## 2010-01-01 ENCOUNTER — Encounter: Payer: Self-pay | Admitting: Cardiovascular Disease

## 2010-01-02 ENCOUNTER — Encounter: Payer: Self-pay | Admitting: Cardiovascular Disease

## 2010-01-07 ENCOUNTER — Encounter: Payer: Self-pay | Admitting: Cardiovascular Disease

## 2010-01-11 ENCOUNTER — Ambulatory Visit: Payer: Self-pay | Admitting: Cardiothoracic Surgery

## 2010-01-23 ENCOUNTER — Ambulatory Visit: Payer: Self-pay | Admitting: Infectious Diseases

## 2010-01-23 ENCOUNTER — Encounter: Admission: RE | Admit: 2010-01-23 | Discharge: 2010-01-23 | Payer: Self-pay | Admitting: Cardiothoracic Surgery

## 2010-01-23 ENCOUNTER — Encounter: Payer: Self-pay | Admitting: Cardiovascular Disease

## 2010-01-23 ENCOUNTER — Ambulatory Visit: Payer: Self-pay | Admitting: Cardiothoracic Surgery

## 2010-01-23 LAB — CONVERTED CEMR LAB
ALT: 20 units/L (ref 0–53)
AST: 17 units/L (ref 0–37)
Albumin: 4.1 g/dL (ref 3.5–5.2)
Alkaline Phosphatase: 104 units/L (ref 39–117)
BUN: 11 mg/dL (ref 6–23)
Basophils Absolute: 0 10*3/uL (ref 0.0–0.1)
Basophils Relative: 1 % (ref 0–1)
CO2: 24 meq/L (ref 19–32)
Calcium: 9.3 mg/dL (ref 8.4–10.5)
Chloride: 104 meq/L (ref 96–112)
Creatinine, Ser: 0.82 mg/dL (ref 0.40–1.50)
Eosinophils Absolute: 0.1 10*3/uL (ref 0.0–0.7)
Eosinophils Relative: 1 % (ref 0–5)
Glucose, Bld: 113 mg/dL — ABNORMAL HIGH (ref 70–99)
HCT: 37 % — ABNORMAL LOW (ref 39.0–52.0)
HIV 1 RNA Quant: <20 copies/mL (ref <20)
HIV-1 RNA Quant, Log: <1.3 (ref <1.30)
Hemoglobin: 12.1 g/dL — ABNORMAL LOW (ref 13.0–17.0)
Lymphocytes Relative: 35 % (ref 12–46)
Lymphs Abs: 2.9 10*3/uL (ref 0.7–4.0)
MCHC: 32.7 g/dL (ref 30.0–36.0)
MCV: 100.3 fL — ABNORMAL HIGH (ref 78.0–100.0)
Monocytes Absolute: 0.5 10*3/uL (ref 0.1–1.0)
Monocytes Relative: 6 % (ref 3–12)
Neutro Abs: 4.7 10*3/uL (ref 1.7–7.7)
Neutrophils Relative %: 57 % (ref 43–77)
Platelets: 321 10*3/uL (ref 150–400)
Potassium: 4.5 meq/L (ref 3.5–5.3)
RBC: 3.69 M/uL — ABNORMAL LOW (ref 4.22–5.81)
RDW: 13.7 % (ref 11.5–15.5)
Sodium: 139 meq/L (ref 135–145)
Total Bilirubin: 0.3 mg/dL (ref 0.3–1.2)
Total Protein: 6.8 g/dL (ref 6.0–8.3)
WBC: 8.1 10*3/uL (ref 4.0–10.5)

## 2010-01-25 ENCOUNTER — Encounter: Payer: Self-pay | Admitting: Cardiology

## 2010-01-25 ENCOUNTER — Ambulatory Visit: Payer: Self-pay | Admitting: Cardiovascular Disease

## 2010-01-29 ENCOUNTER — Encounter (HOSPITAL_COMMUNITY)
Admission: RE | Admit: 2010-01-29 | Discharge: 2010-03-09 | Payer: Self-pay | Source: Home / Self Care | Attending: Cardiovascular Disease | Admitting: Cardiovascular Disease

## 2010-02-07 ENCOUNTER — Ambulatory Visit: Payer: Self-pay | Admitting: Infectious Diseases

## 2010-02-14 ENCOUNTER — Encounter: Payer: Self-pay | Admitting: Cardiovascular Disease

## 2010-02-20 ENCOUNTER — Encounter: Payer: Self-pay | Admitting: Cardiovascular Disease

## 2010-03-06 ENCOUNTER — Encounter: Payer: Self-pay | Admitting: Infectious Diseases

## 2010-03-18 ENCOUNTER — Telehealth: Payer: Self-pay | Admitting: Infectious Diseases

## 2010-03-31 ENCOUNTER — Encounter: Payer: Self-pay | Admitting: Infectious Diseases

## 2010-04-01 ENCOUNTER — Telehealth (INDEPENDENT_AMBULATORY_CARE_PROVIDER_SITE_OTHER): Payer: Self-pay | Admitting: *Deleted

## 2010-04-07 LAB — CONVERTED CEMR LAB
ALT: 34 units/L (ref 0–53)
AST: 29 units/L (ref 0–37)
Albumin: 4.6 g/dL (ref 3.5–5.2)
Alkaline Phosphatase: 92 units/L (ref 39–117)
BUN: 17 mg/dL (ref 6–23)
Basophils Absolute: 0 10*3/uL (ref 0.0–0.1)
Basophils Relative: 0 % (ref 0–1)
CD4 Count: 1140 microliters
CO2: 21 meq/L (ref 19–32)
Calcium: 9.5 mg/dL (ref 8.4–10.5)
Chloride: 103 meq/L (ref 96–112)
Creatinine, Ser: 1.06 mg/dL (ref 0.40–1.50)
Eosinophils Absolute: 0.1 10*3/uL (ref 0.0–0.7)
Eosinophils Relative: 1 % (ref 0–5)
Glucose, Bld: 88 mg/dL (ref 70–99)
HCT: 49.9 % (ref 39.0–52.0)
HIV 1 RNA Quant: 55 copies/mL — ABNORMAL HIGH (ref <50)
HIV-1 RNA Quant, Log: 1.74 — ABNORMAL HIGH (ref <1.70)
Hemoglobin: 17.7 g/dL — ABNORMAL HIGH (ref 13.0–17.0)
Lymphocytes Relative: 31 % (ref 12–46)
Lymphs Abs: 3.2 10*3/uL (ref 0.7–3.3)
MCHC: 35.5 g/dL (ref 30.0–36.0)
MCV: 94.9 fL (ref 78.0–100.0)
Monocytes Absolute: 0.5 10*3/uL (ref 0.2–0.7)
Monocytes Relative: 5 % (ref 3–11)
Neutro Abs: 6.4 10*3/uL (ref 1.7–7.7)
Neutrophils Relative %: 63 % (ref 43–77)
Platelets: 246 10*3/uL (ref 150–400)
Potassium: 4.6 meq/L (ref 3.5–5.3)
RBC: 5.26 M/uL (ref 4.22–5.81)
RDW: 13.3 % (ref 11.5–14.0)
Sodium: 138 meq/L (ref 135–145)
Total Bilirubin: 0.6 mg/dL (ref 0.3–1.2)
Total Protein: 7.7 g/dL (ref 6.0–8.3)
WBC: 10.2 10*3/uL (ref 4.0–10.5)

## 2010-04-09 NOTE — Assessment & Plan Note (Signed)
Summary: F/U OV/VS   CC:  f/u .  History of Present Illness: [name] Smith had redo CABG a month and a half ago and is slowly recovering.He has not returned to work yet and worries he may not be able to work fulltime. He is getting support and advice on this. HIs HIV is under superb control with HIV <20 copies/.and CD4 900. Will continue Atripla and revaccinate for influenza.   Preventive Screening-Counseling & Management  Alcohol-Tobacco     Alcohol drinks/day: 4     Alcohol type: beer     Smoking Status: current     Smoking Cessation Counseling: yes     Smoke Cessation Stage: contemplative     Packs/Day: 1     Cans of tobacco/week: no     Passive Smoke Exposure: yes   Current Allergies (reviewed today): No known allergies  Vital Signs:  Patient profile:   50 year old male Height:      69 inches (175.26 cm) Weight:      161.50 pounds (73.41 kg) BMI:     23.94 Pulse rate:   82 / minute BP sitting:   131 / 85  (left arm)  Vitals Entered By: Starleen Arms CMA (February 07, 2010 10:48 AM)  CC: f/u  Is Patient Diabetic? No Pain Assessment Patient in pain? no      Nutritional Status BMI of 19 -24 = normal Nutritional Status Detail not eating well  Does patient need assistance? Functional Status Self care Ambulation Normal   Physical Exam  General:  Well-developed,well-nourished,in no acute distress; alert,appropriate and cooperative throughout examination Mouth:  good dentition and no gingival abnormalities.   Lungs:  normal respiratory effort and normal breath sounds.   Heart:  normal rate, regular rhythm, and no murmur.     Impression & Recommendations:  Problem # 1:  HIV INFECTION (ICD-042) Assessment Improved  f/u in 4 monthsDoing very well and will continue current regimen.  Orders: Est. Patient Level III (16109)   Immunization History:  Influenza Immunization History:    Influenza:  fluvax non-mcr (12/19/2009)

## 2010-04-09 NOTE — Progress Notes (Signed)
  Phone Note Outgoing Call   Call placed by: Starleen Arms,  March 01, 2007 10:56 AM Call placed to: Patient Action Taken: Phone Call Completed Summary of Call: Patient was concerned that last test result for cd4 was very low, test was done again because of the difference in cd4 counts and the new count was 950. Pt advised of test results.  Initial call taken by: Starleen Arms,  March 02, 2007 2:16 PM

## 2010-04-09 NOTE — Assessment & Plan Note (Signed)
Summary: f1y/dm      Allergies Added: NKDA  CC:  no complaints.  History of Present Illness: [name] Smith is seen today for CAD and increased cholesterol.  He has CABG in 2001 and stenting of the native RCA in 2006.  He has not had any SSCP, palpitaotions, or syncope.  He has some SOB but continues to smoke.  He is interested in a [employer] study and I encouraged him to look into it since he failed Chantix.  He has not had a CXR in a year and we will order one today.  He fell off the dock this weekend and has a sore back.  Otherwise he continues to see Dr. Maurice March for HIV.  His lab counts including T4 were good.    Current Problems (verified): 1)  Cough  (ICD-786.2) 2)  Hypertension  (ICD-401.9) 3)  Hx of Coronary Artery Bypass Graft, Three Vessel, Hx of  (ICD-V45.81) 4)  Coronary Artery Disease  (ICD-414.00) 5)  Hypercholesterolemia  (ICD-272.0) 6)  Need Prophylactic Vaccination&inoculation Flu  (ICD-V04.81) 7)  HIV Infection  (ICD-042) 8)  Nephrolithiasis  (ICD-592.0) 9)  Gerd  (ICD-530.81) 10)  Anxiety  (ICD-300.00)  Current Medications (verified): 1)  Aspirin 81 Mg Tbec (Aspirin) .... Once Daily 2)  Atenolol 25 Mg Tabs (Atenolol) .... Take 1 Tablet By Mouth Once A Day 3)  Crestor 40 Mg Tabs (Rosuvastatin Calcium) .... Once Daily Tab 4)  Plavix 75 Mg Tabs (Clopidogrel Bisulfate) .... Once Daily Tab 5)  Atripla 600-200-300 Mg Tabs (Efavirenz-Emtricitab-Tenofovir) .... Take 1 Tablet By Mouth Once A Day 6)  Fluoxetine Hcl 10 Mg Caps (Fluoxetine Hcl) .... Take 1 Capsule By Mouth Once A Day 7)  Nitroglycerin 0.4 Mg Subl (Nitroglycerin) .... One Tablet Under Tongue Every 5 Minutes As Needed For Chest Pain---May Repeat Times Three  Allergies (verified): No Known Drug Allergies  Past History:  Past Medical History: Last updated: 11/03/2008 Current Problems:  HYPERTENSION (ICD-401.9) Hx of CORONARY ARTERY BYPASS GRAFT, THREE VESSEL, HX OF (ICD-V45.81) CORONARY ARTERY DISEASE  (ICD-414.00) HYPERCHOLESTEROLEMIA (ICD-272.0) NEED PROPHYLACTIC VACCINATION&INOCULATION FLU (ICD-V04.81) HIV INFECTION (ICD-042) NEPHROLITHIASIS (ICD-592.0) GERD (ICD-530.81) ANXIETY (ICD-300.00) Coronary arterial disease  CABG-12/1999  Past Surgical History: Last updated: 11/03/2008  failed stents and requiring bypass surgery.   CABG 2001  (LIMA to the LAD, SVG to ramusintermediate, SVG to diagonal, SVG to circumflex),    PTCA of the right coronary artery with overlapping [employer] non-DES  stents. [name], M.D. [facility]  Electronically Signed TDS/MEDQ  D:  11/23/2004  T:  11/24/2004  Job:  [id number]  Family History: Last updated: 11/03/2008 The family history is positive for coronary disease.  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 Maurice March  Review of Systems       Denies fever, malais, weight loss, blurry vision, decreased visual acuity, cough, sputum, SOB, hemoptysis, pleuritic pain, palpitaitons, heartburn, abdominal pain, melena, lower extremity edema, claudication, or rash. All other systems reviewed and negative except as noted in HPI  Vital Signs:  Patient profile:   50 year old male Height:      69 inches Weight:      165 pounds BMI:     24.45 Pulse rate:   69 / minute Resp:     12 per minute BP sitting:  136 / 70  (left arm)  Vitals Entered By: Kem Parkinson (November 06, 2008 9:34 AM)  Physical Exam  General:  Affect appropriate Healthy:  appears stated age HEENT: normal Neck supple with no adenopathy JVP normal no bruits no thyromegaly Lungs wheezing RUL   good diaphragmatic motion Heart:  S1/S2 no murmur,rub, gallop or click PMI normal Abdomen: benighn, BS positve, no tenderness, no AAA no bruit.  No HSM or HJR Distal pulses intact with no  bruits No edema Neuro non-focal Skin warm and dry Mild pain to palpation over rigth SI joint S/P sternotomy   Impression & Recommendations:  Problem # 1:  Hx of CORONARY ARTERY BYPASS GRAFT, THREE VESSEL, HX OF (ICD-V45.81) No angina.  Continue ASA  Problem # 2:  HYPERTENSION (ICD-401.9) Well contorlled continue low sodium diet His updated medication list for this problem includes:    Aspirin 81 Mg Tbec (Aspirin) ..... Once daily    Atenolol 25 Mg Tabs (Atenolol) .Marland Kitchen... Take 1 tablet by mouth once a day  Problem # 3:  HYPERCHOLESTEROLEMIA (ICD-272.0) Continue statin and diet therapy.   His updated medication list for this problem includes:    Crestor 40 Mg Tabs (Rosuvastatin calcium) ..... Once daily tab  CHOL: 200 (08/17/2008)   LDL: 107 (08/17/2008)   HDL: 52 (08/17/2008)   TG: 206 (08/17/2008)  Problem # 4:  HIV INFECTION (ICD-042) T4 count and viral load stable.  Continue F/U Lane  Problem # 5:  SMOKER (ICD-305.1) Check CXR today.  F/U [facility] research trial  Failed Welbutrin, Chantix and tends to smoke while on nicotine patich Consider PFT's and inhaler for wheezing  Other Orders: T-2 View CXR (71020TC)  Patient Instructions: 1)  Your physician recommends that you schedule a follow-up appointment in: 6 MONTHS   Echocardiogram Report  Procedure date:  11/06/2008  Findings:      NSR 69 Septal infarct age underterm Abnormal ECG

## 2010-04-09 NOTE — Letter (Signed)
Summary: HIV Summary  HIV Summary   Imported By: Dorice Lamas 03/30/2006 11:22:01  _____________________________________________________________________  External Attachment:    Type:   Image     Comment:   External Document

## 2010-04-09 NOTE — Assessment & Plan Note (Signed)
Summary: EST-CK/FU/MEDS/CFB   CC:  follow-up visit.  History of Present Illness: [name] Smith is doing well with no more angina and his HIV is under acceptablecontol . He is on Atripla and CD4 600 and HIV 54. He continues to smoke a pack a day and says he will use his own method which is not working. He has been instructed on safer sex practices which is routine.  Preventive Screening-Counseling & Management  Alcohol-Tobacco     Alcohol drinks/day: 2     Alcohol type: wine     Smoking Status: current     Smoking Cessation Counseling: yes     Smoke Cessation Stage: contemplative     Packs/Day: 1     Cans of tobacco/week: no     Passive Smoke Exposure: yes  Caffeine-Diet-Exercise     Caffeine use/day: 5+     Does Patient Exercise: yes     Type of exercise: gym membership     Exercise (avg: min/session): 30-60     Times/week: 3  Hep-HIV-STD-Contraception     HIV Risk: no risk noted     HIV Risk Counseling: not indicated-no HIV risk noted  Safety-Violence-Falls     Seat Belt Use: 100  Comments: given condoms      Sexual History:  uses condoms.        Drug Use:  former.     Current Allergies (reviewed today): No known allergies  Social History: Sexual History:  uses condoms Drug Use:  former  Vital Signs:  Patient profile:   50 year old male Height:      69 inches (175.26 cm) Weight:      165.2 pounds (75.09 kg) BMI:     24.48 Temp:     97.1 degrees F (36.17 degrees C) oral Pulse rate:   72 / minute BP sitting:   127 / 74  (left arm) Cuff size:   regular  Vitals Entered By: Jennet Maduro RN (March 29, 2009 11:03 AM) CC: follow-up visit Is Patient Diabetic? No Pain Assessment Patient in pain? no      Nutritional Status BMI of 19 -24 = normal Nutritional Status Detail appetite "good"  Have you ever been in a relationship where you felt threatened, hurt or afraid?No   Does patient need assistance? Functional Status Self care Ambulation Normal Comments  no missed doses of rxes   Physical Exam  General:  alert, well-developed, well-nourished, and well-hydrated.   Mouth:  good dentition and pharynx pink and moist.   Genitalia:  there are two papilloma warts at base of penis and about .5cmxo.5 cm and will refer to his PCP FP for probaably hyfecation of the warts. Cervical Nodes:  No lymphadenopathy noted Axillary Nodes:  No palpable lymphadenopathy   Impression & Recommendations:  Problem # 1:  HIV INFECTION (ICD-042)  Orders: Est. Patient Level IV (16109) Est. Patient Level IV (99214)Future Orders: T-CBC w/Diff ([id number]) ... 08/07/2009 T-CD4SP ([facility]) (CD4SP) ... 08/07/2009 T-Comprehensive Metabolic Panel [phone]) ... 08/07/2009 T-HIV Viral Load [phone]) ... 08/07/2009 Will continue Atripla and f/u in 4 months.  Medications Added to Medication List This Visit: 1)  Viagra 100 Mg Tabs (Sildenafil citrate) .... One half tablet as directed  Other Orders: Hepatitis A Vaccine (Adult Dose) [phone]) Admin 1st Vaccine (28413) Admin 1st Vaccine [facility]) [phone])  Patient Instructions: 1)  Please schedule a follow-up appointment in 4-5 months. 2)  Be sure to return for lab work one (1) week before your next appointment as scheduled. Prescriptions: VIAGRA  100 MG TABS (SILDENAFIL CITRATE) one half tablet as directed  #10 x 11   Entered and Authorized by:   Lina Sayre MD   Signed by:   Lina Sayre MD on 03/29/2009   Method used:   Print then Give to Patient   RxID:   [id number] HYDROCODONE-ACETAMINOPHEN 5-500 MG TABS (HYDROCODONE-ACETAMINOPHEN) Take 1 tablet by mouth every 6 hours as needed pain  #120 x 5   Entered and Authorized by:   Lina Sayre MD   Signed by:   Lina Sayre MD on 03/29/2009   Method used:   Print then Give to Patient   RxID:   [id number] HYDROCODONE-ACETAMINOPHEN 5-500 MG TABS (HYDROCODONE-ACETAMINOPHEN) Take 1 tablet by mouth every 6 hours as needed pain  #120 x 11   Entered and  Authorized by:   Lina Sayre MD   Signed by:   Lina Sayre MD on 03/29/2009   Method used:   Print then Give to Patient   RxID:   [id number] HYDROCODONE-ACETAMINOPHEN 5-500 MG TABS (HYDROCODONE-ACETAMINOPHEN) Take 1 tablet by mouth every 6 hours as needed pain  #120 x 11   Entered and Authorized by:   Lina Sayre MD   Signed by:   Lina Sayre MD on 03/29/2009   Method used:   Print then Give to Patient   RxID:   [id number]  Process Orders Check Orders Results:     [facility]: ABN not required for this insurance Tests Sent for requisitioning (March 29, 2009 12:05 PM):     08/07/2009: [facility] -- T-CBC w/Diff [[id number]] (signed)     08/07/2009: [facility] -- T-Comprehensive Metabolic Panel [[id number]] (signed)     08/07/2009: [facility] -- T-HIV Viral Load [phone] (signed)      Hepatitis A Vaccine # 2    Vaccine Type: HepA    Site: right deltoid    Mfr: GlaxoSmithKline    Dose: 1.0 ml    Route: IM    Given by: Jennet Maduro RN    Exp. Date: 06/27/2011    Lot #: [id number]

## 2010-04-09 NOTE — Assessment & Plan Note (Signed)
Summary: FU OV/VS   Chief Complaint:  check up.  History of Present Illness:  eliceo gladu is doing well. He wrenched his back three weeks ago but is healing now. His ARV for HIV is Atripla and his latest VL is 143 withCD4 1048. Updated hepA vaccinetoday. He has had now anginal pain and adheres to his meds. He continues to smoke about 5-8 cig per day and offered smoking  cessation attempt and says he is not ready. His is adherent to ARVs and has safe sex with partner.    Current Allergies (reviewed today): No known allergies     Risk Factors:  Tobacco use:  current    Counseled to quit/cut down tobacco use:  yes Passive smoke exposure:  yes Drug use:  yes    Substance:  marijuana    Comments:  occassionally HIV high-risk behavior:  yes    Comments:  not always, more recently Caffeine use:  5+ drinks per day Alcohol use:  yes    Type:  wine    Drinks per day:  3    Counseled to quit/cut down alcohol use:  yes Exercise:  yes    Type:  gym membership Seatbelt use:  100 %    Vital Signs:  Patient Profile:   50 Years Old Male Height:     69 inches (175.26 cm) Weight:      168.4 pounds (76.55 kg) BMI:     24.96 Temp:     97.0 degrees F (36.11 degrees C) oral Pulse rate:   70 / minute BP sitting:   124 / 78  (left arm)  Pt. in pain?   no  Vitals Entered By: Jennet Maduro RN (March 16, 2008 9:55 AM)              Is Patient Diabetic? No Nutritional Status BMI of 19 -24 = normal Nutritional Status Detail appetite "I'm hungry."  Have you ever been in a relationship where you felt threatened, hurt or afraid?No   Does patient need assistance? Functional Status Self care Ambulation Normal     Physical Exam  General:     alert, well-developed, and well-nourished.   Eyes:     pupils equal and pupils round.   Mouth:     good dentition, no gingival abnormalities, and pharynx pink and moist.   Neck:     supple and no masses.   Breasts:     no  adenopathy.   Lungs:     normal respiratory effort.      Impression & Recommendations:  Problem # 1:  HIV INFECTION (ICD-042)  His updated medication list for this problem includes:    Atripla 600-200-300 Mg Tabs (Efavirenz-emtricitab-tenofovir) .Marland Kitchen... Take 1 tablet by mouth once a day Given hepA vaccine since no antibodies present. RTC 5months and continue Atripla.  Future Orders: T-CBC w/Diff [phone]) ... 07/10/2008 T-CD4 ([id number]) ... 07/10/2008 T-Comprehensive Metabolic Panel [phone]) ... 07/10/2008 T-HIV Viral Load [id number]) ... 07/10/2008 T-RPR (Syphilis) [phone]) ... 07/10/2008   Other Orders: H1N1 vaccine G code (W5462) Influenza A (H1N1) adm  fee Medicare/Non Medicare [phone])   Patient Instructions: 1)  Please schedule a follow-up appointment in 5 months. 2)  Be sure to return for lab work one (1) week before your next appointment as scheduled.   ]  Influenza Immunization History:    Influenza # 1:  Historical (12/09/2007)  Flu Vaccine Consent Questions    Do you have a history of severe allergic reactions to this  vaccine? no    Any prior history of allergic reactions to egg and/or gelatin? no    Do you have a sensitivity to the preservative Thimersol? no    Do you have a past history of Guillan-Barre Syndrome? no    Do you currently have an acute febrile illness? no    Have you ever had a severe reaction to latex? no    Vaccine information given and explained to patient? yes  H1N1 # 1    Vaccine Type: H1N1 vaccine G code    Site: right deltoid    Mfr: Sanofi Pasteur    Dose: 0.5 ml    Route: IM    Given by: Jennet Maduro RN    Exp. Date: 07/04/2009    Lot #: [id number]     Given educational materials on Stop Smoking and How to Tell Others about HIV status.  Given Condoms.   Appended Document: Orders Update - Hep A vaccine    Clinical Lists Changes  Orders: Added new Service order of Hepatitis A Vaccine (Adult Dose)  (54098) - Signed Added new Service order of Admin 1st Vaccine (11914) - Signed Observations: Added new observation of HEPAVAX#1LOT: [id number] (03/16/2008 11:01) Added new observation of HEPAVAX#1EXP: 06/23/2010 (03/16/2008 11:01) Added new observation of HEPAVAX#1 BY: Jennet Maduro RN (03/16/2008 11:01) Added new observation of HEPAVAX#1RTE: IM (03/16/2008 11:01) Added new observation of HEPAVAX#1DOS: 1.0 ml (03/16/2008 11:01) Added new observation of HEPAVAX#1MFR: GlaxoSmithKline (03/16/2008 11:01) Added new observation of HEPAVAX#1SIT: left deltoid (03/16/2008 11:01) Added new observation of HEPAVAX #1: HepA (03/16/2008 11:01)       Hepatitis A Vaccine # 1    Vaccine Type: HepA    Site: left deltoid    Mfr: GlaxoSmithKline    Dose: 1.0 ml    Route: IM    Given by: Jennet Maduro RN    Exp. Date: 06/23/2010    Lot #: [id number]

## 2010-04-09 NOTE — Assessment & Plan Note (Signed)
Summary: [name] Smith SHOT/CFB  Nurse Visit   Allergies: No Known Drug Allergies  Orders Added: 1)  Admin 1st Vaccine [90471] 2)  Flu Vaccine 55yrs + [73220] Flu Vaccine Consent Questions     Do you have a history of severe allergic reactions to this vaccine? no    Any prior history of allergic reactions to egg and/or gelatin? no    Do you have a sensitivity to the preservative Thimersol? no    Do you have a past history of Guillan-Barre Syndrome? no    Do you currently have an acute febrile illness? no    Have you ever had a severe reaction to latex? no    Vaccine information given and explained to patient? yes    Are you currently pregnant? no    Lot [id number]   Exp Date:06/07/2009   Manufacturer: Novartis    Site Given  Left Deltoid Randa Ngo CMA  December 22, 2008 2:19 PM  .Roque Lias

## 2010-04-09 NOTE — Assessment & Plan Note (Signed)
Summary: labwork

## 2010-04-09 NOTE — Letter (Signed)
Summary: OV-10/16/2005  OV-10/16/2005   Imported By: Dorice Lamas 03/30/2006 11:19:16  _____________________________________________________________________  External Attachment:    Type:   Image     Comment:   External Document

## 2010-04-09 NOTE — Letter (Signed)
Summary: [place] [facility]  [facility]   Imported By: Roderic Ovens 01/24/2010 12:16:35  _____________________________________________________________________  External Attachment:    Type:   Image     Comment:   External Document

## 2010-04-09 NOTE — Assessment & Plan Note (Signed)
Summary: eph/post cabg  Medications Added ASPIRIN EC 325 MG TBEC (ASPIRIN) Take one tablet by mouth daily ADVAIR DISKUS 250-50 MCG/DOSE AEPB (FLUTICASONE-SALMETEROL) as directed * FOLIC ACID 1 tab by mouth once daily METOPROLOL SUCCINATE 50 MG XR24H-TAB (METOPROLOL SUCCINATE) Take one tablet by mouth daily NICODERM CQ 21 MG/24HR PT24 (NICOTINE) as directed ALPRAZOLAM 0.25 MG TABS (ALPRAZOLAM) 1 at bedtime CHANTIX STARTING MONTH PAK 0.5 MG X 11 & 1 MG X 42 TABS (VARENICLINE TARTRATE) AS DIRECTED      Allergies Added: NKDA  History of Present Illness: [name] Smith is seen today post hospitial D/C for redo CABG.  He has F/U with PVT and CXR looks fine.  No SSCP, palpitations or edema.  Not sleeping well.  Still smoking.  Counseled for less than 10 minutes.  Willing to try Chantix  Had patches in hospital with "filtered" inhalation device while waiting for CABG.  Reviewed labs from 11/16 and they were fine with T4 35%.  have sent Dr Maurice March a note regarding choice of statin with antiretroviral Rx since I think Pravachol is preferred to decrease hepatotoxicity.  Current Problems (verified): 1)  Smoker  (ICD-305.1) 2)  Cough  (ICD-786.2) 3)  Hypertension  (ICD-401.9) 4)  Hx of Coronary Artery Bypass Graft, Three Vessel, Hx of  (ICD-V45.81) 5)  Coronary Artery Disease  (ICD-414.00) 6)  Hypercholesterolemia  (ICD-272.0) 7)  Need Prophylactic Vaccination&inoculation Flu  (ICD-V04.81) 8)  HIV Infection  (ICD-042) 9)  Nephrolithiasis  (ICD-592.0) 10)  Gerd  (ICD-530.81) 11)  Anxiety  (ICD-300.00)  Current Medications (verified): 1)  Aspirin Ec 325 Mg Tbec (Aspirin) .... Take One Tablet By Mouth Daily 2)  Plavix 75 Mg Tabs (Clopidogrel Bisulfate) .... Once Daily Tab 3)  Atripla 600-200-300 Mg Tabs (Efavirenz-Emtricitab-Tenofovir) .... Take 1 Tablet By Mouth Once A Day 4)  Fluoxetine Hcl 10 Mg Caps (Fluoxetine Hcl) .... Take 1 Capsule By Mouth Once A Day 5)  Nitroglycerin 0.4 Mg Subl (Nitroglycerin)  .... One Tablet Under Tongue Every 5 Minutes As Needed For Chest Pain---May Repeat Times Three 6)  Advair Diskus 250-50 Mcg/dose Aepb (Fluticasone-Salmeterol) .... As Directed 7)  Folic Acid .Marland Kitchen.. 1 Tab By Mouth Once Daily 8)  Metoprolol Succinate 50 Mg Xr24h-Tab (Metoprolol Succinate) .... Take One Tablet By Mouth Daily 9)  Nicoderm Cq 21 Mg/24hr Pt24 (Nicotine) .... As Directed 10)  Alprazolam 0.25 Mg Tabs (Alprazolam) .Marland Kitchen.. 1 At Bedtime 11)  Chantix Starting Month Pak 0.5 Mg X 11 & 1 Mg X 42 Tabs (Varenicline Tartrate) .... As Directed  Allergies (verified): No Known Drug Allergies  Past History:  Past Medical History: Last updated: 11/03/2008 Current Problems:  HYPERTENSION (ICD-401.9) Hx of CORONARY ARTERY BYPASS GRAFT, THREE VESSEL, HX OF (ICD-V45.81) CORONARY ARTERY DISEASE (ICD-414.00) HYPERCHOLESTEROLEMIA (ICD-272.0) NEED PROPHYLACTIC VACCINATION&INOCULATION FLU (ICD-V04.81) HIV INFECTION (ICD-042) NEPHROLITHIASIS (ICD-592.0) GERD (ICD-530.81) ANXIETY (ICD-300.00) Coronary arterial disease  CABG-12/1999  Past Surgical History: Last updated: 01/24/2010  Redo coronary artery bypass grafting x4 (sequential saphenous vein  graft to posterior descending and posterolateral branch of the right coronary, saphenous vein graft to ramus intermediate, saphenous vein graft to distal LAD).  Endoscopic harvest of left leg greater saphenous vein. SURGEON:  [name], MD ASSISTANT:  Doree Fudge, PA ANESTHESIA:  General by Dr. Arta Bruce. [name], M.D.  [name], M.D. PV/MEDQ  D:  12/28/2009  T:  12/29/2009  Job:  [id number] cc:   [name], MD, [facility]          failed stents and  requiring bypass surgery.   CABG 2001  (LIMA to the LAD, SVG to ramusintermediate, SVG to diagonal, SVG to circumflex),   PTCA of the right coronary artery with overlapping Mini Vision non-DES  stents. [name], M.D. [facility]  Electronically Signed TDS/MEDQ  D:  11/23/2004  T:   11/24/2004  Job:  [id number]  Family History: Last updated: 11/03/2008 The family history is positive for coronary disease.  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 Maurice March  Review of Systems       Denies fever, malais, weight loss, blurry vision, decreased visual acuity, cough, sputum, SOB, hemoptysis, pleuritic pain, palpitaitons, heartburn, abdominal pain, melena, lower extremity edema, claudication, or rash.   Vital Signs:  Patient profile:   50 year old male Height:      69 inches Weight:      163 pounds BMI:     24.16 Pulse rate:   80 / minute Resp:     14 per minute BP sitting:   120 / 72  (left arm)  Vitals Entered By: Kem Parkinson (January 25, 2010 10:23 AM)  Physical Exam  General:  Affect appropriate Healthy:  appears stated age HEENT: normal Neck supple with no adenopathy JVP normal no bruits no thyromegaly Lungs clear with no wheezing and good diaphragmatic motion Heart:  S1/S2 no murmur,rub, gallop or click PMI normal Abdomen: benighn, BS positve, no tenderness, no AAA no bruit.  No HSM or HJR Distal pulses intact with no bruits No edema Neuro non-focal Skin warm and dry    Impression & Recommendations:  Problem # 1:  SMOKER (ICD-305.1) Chantix and continue inhalation vapor cigarette  Problem # 2:  HYPERTENSION (ICD-401.9) Well controlled The following medications were removed from the medication list:    Atenolol 25 Mg Tabs (Atenolol) .Marland Kitchen... Take 1 tablet by mouth once a day His updated medication list for this problem includes:    Aspirin Ec 325 Mg Tbec (Aspirin) .Marland Kitchen... Take one tablet by mouth daily    Metoprolol Succinate 50 Mg Xr24h-tab (Metoprolol succinate) .Marland Kitchen... Take one tablet by mouth daily  Problem # 3:  CORONARY ARTERY DISEASE (ICD-414.00) S/P redo CABG  Healing well  Rehab.  Contineu BB and ASA The following medications were removed from the  medication list:    Atenolol 25 Mg Tabs (Atenolol) .Marland Kitchen... Take 1 tablet by mouth once a day His updated medication list for this problem includes:    Aspirin Ec 325 Mg Tbec (Aspirin) .Marland Kitchen... Take one tablet by mouth daily    Plavix 75 Mg Tabs (Clopidogrel bisulfate) ..... Once daily tab    Nitroglycerin 0.4 Mg Subl (Nitroglycerin) ..... One tablet under tongue every 5 minutes as needed for chest pain---may repeat times three    Metoprolol Succinate 50 Mg Xr24h-tab (Metoprolol succinate) .Marland Kitchen... Take one tablet by mouth daily  Orders: EKG w/ Interpretation (93000)  Problem # 4:  HYPERCHOLESTEROLEMIA (ICD-272.0) Needs statin ? change to pravachol with antiretroviral RX The following medications were removed from the medication list:    Crestor 40 Mg Tabs (Rosuvastatin calcium) ..... Once daily tab  Patient Instructions: 1)  Your physician recommends that you schedule a follow-up appointment in: 3 MONTHS WITH DR Eden Emms 2)  Your physician recommends that you continue on your current medications as directed. Please refer to the Current Medication list given to you today. 3)  CHANTIX AS DIRECTED 4)  XANAX 0.25 MG  1 EVERT PM Prescriptions: CHANTIX STARTING MONTH PAK 0.5 MG X 11 & 1 MG X 42 TABS (VARENICLINE TARTRATE) AS DIRECTED  #42 x 3   Entered by:   Scherrie Bateman, LPN   Authorized by:   Colon Branch, MD, [facility]   Signed by:   Scherrie Bateman, LPN on 81/19/1478   Method used:   [facility]axed to ...       [facility] [address] [phone][facility] (retail)       [address] [place], [place]  [address]       Ph: [phone]       Fax: [phone]   RxID:   [id number] ALPRAZOLAM 0.25 MG TABS (ALPRAZOLAM) 1 at bedtime  #30 x 1   Entered by:   Scherrie Bateman, LPN   Authorized by:   Colon Branch, MD, [facility]   Signed by:   Scherrie Bateman, LPN on 25/95/6387   Method used:   Print then Give to Patient   RxID:   [id number]    EKG Report  Procedure date:  01/25/2010  Findings:       NSR 89 Nonspecific ST/T wave changes

## 2010-04-09 NOTE — Miscellaneous (Signed)
Summary: Orders Update  Clinical Lists Changes  Orders: Added new Test order of T-Lipid Profile [id number]) - Signed Added new Test order of T-CBC w/Diff [phone]) - Signed Added new Test order of T-CD4SP [facility]) (CD4SP) - Signed Added new Test order of T-Comprehensive Metabolic Panel [phone]) - Signed Added new Test order of T-HIV Viral Load [phone]) - Signed Added new Test order of T-RPR (Syphilis) ([id number]) - Signed     Process Orders Check Orders Results:     [facility]: ABN not required for this insurance Order queued for requisitioning for [facility]: March 23, 2009 12:10 PM  Tests Sent for requisitioning (March 23, 2009 12:10 PM):     03/23/2009: [facility] -- T-Lipid Profile [id number] (signed)     03/23/2009: [facility] -- T-CBC w/Diff [[id number]] (signed)     03/23/2009: [facility] -- T-Comprehensive Metabolic Panel [[id number]] (signed)     03/23/2009: [facility] -- T-HIV Viral Load [phone] (signed)     03/23/2009: [facility] -- T-RPR (Syphilis) [phone] (signed)

## 2010-04-09 NOTE — Letter (Signed)
Summary: Appointment - Reminder 2  [facility], Main Office  [address]   [place], [place] [address]   Phone: [phone]  Fax: [phone]     March 15, 2009 MRN: [id number]   JEYDAN BARNER [address] [place], [place]  [address]   Dear Mr. LAMPING,  Our records indicate that it is time to schedule a follow-up appointment with Dr. Eden Emms. It is very important that we reach you to schedule this appointment. We look forward to participating in your health care needs. Please contact us at the number listed above at your earliest convenience to schedule your appointment.  If you are unable to make an appointment at this time, give [place] a call so we can update our records.     Sincerely,   Glass blower/designer

## 2010-04-09 NOTE — Miscellaneous (Signed)
Summary: RW - HIV/AIDS Status  Clinical Lists Changes  Observations: Added new observation of YEARAIDSPOS: HIV2008, c (03/09/2007 11:57)                                                               Hep C Result:  No

## 2010-04-09 NOTE — Assessment & Plan Note (Signed)
Summary: f/u change from am kam   CC:  follow-up visit.  History of Present Illness: [name] Smith is doing well on atripla and is suppressed. He has had noproblems. He continues to smoke and says he is not yet ready to quit. His current med is Atripla and will continue it. He sees Adult nurse Cards for his CAD and says that he has not had angina.  Preventive Screening-Counseling & Management  Alcohol-Tobacco     Alcohol drinks/day: 4     Alcohol type: beer     Smoking Status: current     Smoking Cessation Counseling: yes     Smoke Cessation Stage: contemplative     Packs/Day: 1     Cans of tobacco/week: no     Passive Smoke Exposure: yes  Caffeine-Diet-Exercise     Caffeine use/day: 5+     Does Patient Exercise: yes     Type of exercise: gym membership     Exercise (avg: min/session): 30-60     Times/week: 3  Hep-HIV-STD-Contraception     HIV Risk: no risk noted     HIV Risk Counseling: not indicated-no HIV risk noted  Safety-Violence-Falls     Seat Belt Use: 100  Comments: given condoms   Current Allergies (reviewed today): No known allergies  Vital Signs:  Patient profile:   50 year old male Height:      69 inches (175.26 cm) Weight:      163.25 pounds (74.20 kg) BMI:     24.19 Pulse rate:   86 / minute BP sitting:   135 / 83  (left arm) Cuff size:   regular  Vitals Entered By: Jennet Maduro RN (August 30, 2009 2:06 PM) CC: follow-up visit Is Patient Diabetic? No Pain Assessment Patient in pain? no      Nutritional Status BMI of 19 -24 = normal Nutritional Status Detail appetite  Have you ever been in a relationship where you felt threatened, hurt or afraid?No   Does patient need assistance? Functional Status Self care Ambulation Normal Comments no missed doses   Physical Exam  General:  Well-developed,well-nourished,in no acute distress; alert,appropriate and cooperative throughout examination Mouth:  Oral mucosa and oropharynx without lesions or  exudates.  Teeth in good repair. Lungs:  Normal respiratory effort, chest expands symmetrically. Lungs are clear to auscultation, no crackles or wheezes. Heart:  Normal rate and regular rhythm. S1 and S2 normal without gallop, murmur, click, rub or other extra sounds.   Impression & Recommendations:  Problem # 1:  HIV INFECTION (ICD-042)  His updated medication list for this problem includes:    Azithromycin 500 Mg Tabs (Azithromycin) .Marland Kitchen... Take 2 tablets by mouth on the first day, then 1 tablet by mouth until gone.  Orders: Est. Patient Level IV ([address]) T-CBC w/Diff [id number]) T-CD4SP ([facility]) (CD4SP) T-Comprehensive Metabolic Panel [id number]) Continue Atripla and return for f/u in 5 months T-HIV Viral Load ([id number])  Other Orders: Est. Patient Level III ([address])  Patient Instructions: 1)  Please schedule a follow-up appointment in 5-6 months. 2)  Be sure to return for lab work one (1) week before your next appointment as scheduled.  Process Orders Check Orders Results:     [facility]: ABN not required for this insurance Tests Sent for requisitioning (August 30, 2009 2:54 PM):     08/30/2009: [facility] -- T-CBC w/Diff [[id number]] (signed)     08/30/2009: [facility] -- T-Comprehensive Metabolic Panel [[id number]] (signed)     08/30/2009: [facility] -- T-HIV Viral Load [phone] (signed)

## 2010-04-09 NOTE — Progress Notes (Signed)
Summary: Pt. wanting to know lab results  Phone Note Call from Patient Call back at Home Phone [phone]   Caller: Patient Reason for Call: Lab or Test Results Summary of Call: RN spoke with pt. and shared lab results.  Pt. pleased with his progress.  RN advised increasing exercise and decreasing fat/fried food in diet. Jennet Maduro RN  September 06, 2008 11:26 AM

## 2010-04-09 NOTE — Assessment & Plan Note (Signed)
Summary: FU OV/VS   Chief Complaint:  f/u.  History of Present Illness: [name] Smith is doing well and recently saw Dr. Eden Emms and he CAD is stable.He continues to try to stop smoking and endorse continued attempts to stop.        HIs HIV is under control on Atripla one table hs. He has been adherent about 95% doses and his HIV is reasonably suppressed at 94copies of RNA/ml. Encourage him adhere 100% if he possibly can. His CD4 count is reported at 110 but has never been below 500 over past three years and likely lab error. Will repeat today or tomorrow depending wether he can stay this am or not.   Current Allergies: No known allergies     Risk Factors:  Tobacco use:  current Alcohol use:  yes    Type:  wine    Drinks per day:  1   Review of Systems  The patient denies fever and weight loss.     Vital Signs:  Patient Profile:   50 Years Old Male Height:     69 inches (175.26 cm) Weight:      173.38 pounds (78.81 kg) BMI:     25.70 Temp:     98.3 degrees F (36.83 degrees C) oral Pulse rate:   82 / minute BP sitting:   121 / 78  (left arm)  Pt. in pain?   no  Vitals Entered By: Starleen Arms (February 25, 2007 10:10 AM)              Is Patient Diabetic? No Nutritional Status BMI of 19 -24 = normal  Does patient need assistance? Functional Status Self care Ambulation Normal     Physical Exam  General:     well-developed and well-nourished.   Mouth:     pharynx pink and moist.   Neck:     no masses.      Impression & Recommendations:  Problem # 1:  HIV INFECTION (ICD-042)  His updated medication list for this problem includes:    Atripla 600-200-300 Mg Tabs (Efavirenz-emtricitab-tenofovir) .Marland Kitchen... Take 1 tablet by mouth once a day HIV is 94 but CD$ reportedat 110 is likely error and will repeat today or tomorrwo. RTC 4 months for routine HIV followup.  Orders: Est. Patient Level IV ([address])      ]

## 2010-04-09 NOTE — Miscellaneous (Signed)
Summary: Orders Update  Clinical Lists Changes  Orders: Added new Test order of T-CBC w/Diff [phone]) - Signed Added new Test order of T-CD4SP [facility]) (CD4SP) - Signed Added new Test order of T-Comprehensive Metabolic Panel [id number]) - Signed Added new Test order of T-HIV Viral Load [id number]) - Signed     Process Orders Check Orders Results:     [facility]: ABN not required for this insurance Tests Sent for requisitioning (December 06, 2008 1:41 PM):     11/29/2008: [facility] -- T-CBC w/Diff [[id number]] (signed)     11/29/2008: [facility] -- T-Comprehensive Metabolic Panel [[id number]] (signed)     11/29/2008: [facility] -- T-HIV Viral Load [phone] (signed)

## 2010-04-09 NOTE — Progress Notes (Signed)
Summary[place]  Phone Note Call from Patient Call back at [phone]   Caller: Patient Summary of Call: Pt. [place] Dr. Maurice March [place]  "Active" list of current rxes for the pt. does not have pain medications listed.  Old medication list does have a pain medication listed which was d/ced by Dr. Eden Emms earlier this year.  RN called pt. back and left message to call the office and be more specific about his needs. Jennet Maduro RN  November 06, 2008 2:01 PM   Follow-up for Phone Call        Received a fax from patient's pharmacy asking if [place] can have refill his pain med early. Patient left at beach.  Spoke to [place], Charity fundraiser (Dr. Bonnetta Barry nurse) who stated not to fill early. Pharmacy notified. Follow-up by: Paulo Fruit  BS,CPht II,MPH,  November 06, 2008 3:28 PM

## 2010-04-09 NOTE — Progress Notes (Signed)
Summary: Pt[facility]  Phone Note Call from Patient Call back at Home Phone [phone]   Caller: Patient Call For: Lina Sayre MD Reason for Call: Refill Medication, Talk to Nurse Summary of Call: Message left requesting refill on his Vicodin 5/500.  [facility] [address].  Pt. has developed a pattern over the last 3 months of requesting refill early for a variety of reasons that [facility] to [facility].  Pattern started after refill on 06/23/2009.  Pt. requested refill on 07/17/2009, then 08/09/2009 and then, 09/01/2009.  He is now requesting a refill today, 09/26/2009.  Pharmacist has requested the MD decide whether the pt. should be limited to refill "ONLY" every 30 days or to change the pt. instructions to "every 4-6 hours" with total # of tablets increased to #180.  RN to talk with Dr. Maurice March 09/27/2009 about this question.  Jennet Maduro RN  September 26, 2009 10:54 AM      Appended Document: Pt. refilling [facility] RN spoke with Dr. Maurice March.  Pt. to keep to same Pain medication amount and refill ONLY every 30 days.  [facility] order.

## 2010-04-09 NOTE — Op Note (Signed)
Summary: Between   [place]   Imported By: Roderic Ovens 02/11/2010 11:35:50  _____________________________________________________________________  External Attachment:    Type:   Image     Comment:   External Document

## 2010-04-09 NOTE — Progress Notes (Signed)
Summary: "green, thick phlegm, cough x 4-5 days" requested abx  Phone Note Call from Patient Call back at [phone]   Refills Requested: Medication #1:  AZITHROMYCIN 500 MG TABS Take 2 tablets by mouth on the first day   Last Refilled: 07/05/2009  Method Requested: Electronic Next Appointment Scheduled: 08/30/2009 @ 2:30 pm Initial call taken by: Jennet Maduro RN,  August 16, 2009 10:29 AM Caller: Patient Call For: Lina Sayre MD Reason for Call: Acute Illness Summary of Call: Message left. " Really bad cough.  Green phlegm x 4-5 days."  Pt. requesting antibiotic "to keep from turning in to pneumonia."   OK to rx azithromycin x 5 days per Dr. Maurice March. Jennet Maduro RN  August 16, 2009 10:28 AM      Prescriptions: AZITHROMYCIN 500 MG TABS (AZITHROMYCIN) Take 2 tablets by mouth on the first day, then 1 tablet by mouth until gone.  #6 x 0   Entered by:   Jennet Maduro RN   Authorized by:   Lina Sayre MD   Signed by:   Jennet Maduro RN on 08/16/2009   Method used:   [facility]ronically to        [facility] [address] [facility] [phone]* (retail)       [address] [place], [place]  [address]       Ph: [phone]       Fax: [phone]   RxID:   [id number]

## 2010-04-09 NOTE — Progress Notes (Signed)
Summary: Request for pain med?  Phone Note From Pharmacy   [facility]  [address] [phone][facility] Reason for Call: Needs renewal Summary of Call: Received a fax refill request for patient's Hydrocodone/APAP 5-500mg  1 every 6 hours as needed.  Last Filled #60 06/18/07. It is not in EMR.  Would you like for patient to have? Initial call taken by: Paulo Fruit,  Jul 16, 2007 2:21 PM  Follow-up for Phone Call        Byrd Hesselbach, Let's give him a month script as previous and wuill sign when you tell me. [facility] Follow-up by: Lina Sayre MD,  Jul 16, 2007 4:52 PM    New/Updated Medications: HYDROCODONE-ACETAMINOPHEN 5-500 MG  TABS (HYDROCODONE-ACETAMINOPHEN) Take 1 tablet by mouth every 6 hours as needed   Prescriptions: HYDROCODONE-ACETAMINOPHEN 5-500 MG  TABS (HYDROCODONE-ACETAMINOPHEN) Take 1 tablet by mouth every 6 hours as needed  #60 x 0   Entered by:   Paulo Fruit  BS,CPht II,MPH   Authorized by:   Lina Sayre MD   Signed by:   Paulo Fruit  BS,CPht II,MPH on 07/19/2007   Method used:   Telephoned to ...       [facility] [phone][facility]       [address]       [place], [place]  [address]       Ph: [phone]       Fax: [phone]   RxID:   [id number]  Left on Pharmacy Doctor's line per Maurice March.

## 2010-04-09 NOTE — Progress Notes (Signed)
Summary: refill request for Norco  Phone Note Call from Patient Call back at Home Phone [phone]   Caller: Patient Call For: Lina Sayre MD Reason for Call: Refill Medication Summary of Call: Walk -in to clinic for flu vaccine.  Pt. requesting refill on Norco.  RX in on inactive medication list.  MD currently out-of-the-office.  Will contact MD when he returns. Jennet Maduro RN  December 21, 2008 11:10 AM  Order from Dr. Roselyn Reef.  Norco 5/500 # 30 with 3 refills.  Jennet Maduro RN  December 26, 2008 10:06 AM     New/Updated Medications: HYDROCODONE-ACETAMINOPHEN 5-500 MG TABS (HYDROCODONE-ACETAMINOPHEN) Take 1 tablet by mouth every 6 hours HYDROCODONE-ACETAMINOPHEN 5-500 MG TABS (HYDROCODONE-ACETAMINOPHEN) Take 1 tablet by mouth every 6 hours as needed pain Prescriptions: HYDROCODONE-ACETAMINOPHEN 5-500 MG TABS (HYDROCODONE-ACETAMINOPHEN) Take 1 tablet by mouth every 6 hours  #30 x 3   Entered by:   Jennet Maduro RN   Authorized by:   Lina Sayre MD   Signed by:   Jennet Maduro RN on 12/26/2008   Method used:   Telephoned to ...       [facility] [address] (retail)       [address] [place], [place]  [address]       Ph: [phone]       Fax: [phone]   RxID:   [id number]

## 2010-04-09 NOTE — Progress Notes (Signed)
Summary: faxed labs to Dr. Fabio Bering office per request  Phone Note From Other Clinic Call back at [phone]   Caller: Receptionist Call For: Dr. Burna Forts Details for Reason: need most recent labs faxed Details of Action Taken: faxed 1/08 labs 06/10/06 Initial call taken by: Jennet Maduro RN,  June 10, 2006 12:08 PM

## 2010-04-09 NOTE — Assessment & Plan Note (Signed)
Summary: FU VISIT/DS   CC:  refills on hydrocodone/ty   back pain.  Preventive Screening-Counseling & Management  Alcohol-Tobacco     Alcohol drinks/day: 1     Smoking Status: current     Packs/Day: 1.0  Caffeine-Diet-Exercise     Caffeine use/day: limited   Safety-Violence-Falls     Seat Belt Use: yes      Drug Use:  never.        Blood Transfusions:  no.        Travel History:  no.     Current Allergies (reviewed today): No known allergies  Social History: Drug Use:  never Blood Transfusions:  no Travel History:  no  Vital Signs:  Patient profile:   50 year old male Height:      69 inches (175.26 cm) Weight:      169 pounds (76.82 kg) BMI:     25.05 Temp:     97.9 degrees F (36.61 degrees C) oral Pulse rate:   83 / minute BP sitting:   120 / 80  (left arm)  Vitals Entered By: Starleen Arms CMA (August 17, 2008 10:33 AM) CC: refills on hydrocodone/ty   back pain Is Patient Diabetic? No Pain Assessment Patient in pain? yes     Location: back Intensity: 5 Type: aching Nutritional Status BMI of 25 - 29 = overweight Nutritional Status Detail nl  Does patient need assistance? Functional Status Self care Ambulation Normal    Other Orders: T-CBC w/Diff [phone]) T-CD4 [phone]) T-HIV Viral Load [phone]) T-Comprehensive Metabolic Panel [phone]) T-Lipid Profile (16606-30160)  Patient Instructions: 1)  Please schedule a follow-up appointment in 3 months.

## 2010-04-09 NOTE — Miscellaneous (Signed)
Summary: clinical update/ryan white  Clinical Lists Changes  Observations: Added new observation of HOUSEINCOME: [address]109  (07/08/2006 14:23) Added new observation of FINASSESSDT: 07/08/2006  (07/08/2006 14:23) Added new observation of YEARLYEXPEN: 2660  (07/08/2006 14:23) Added new observation of REC_MESSAGE: Yes  (07/08/2006 14:23) Added new observation of RECPHONECALL: Yes  (07/08/2006 14:23) Added new observation of REC_MAIL: Yes  (07/08/2006 14:23)

## 2010-04-09 NOTE — Progress Notes (Signed)
Summary: wanting ot know lab results  Phone Note Call from Patient Call back at Home Phone [phone]   Caller: Patient Reason for Call: Lab or Test Results Action Taken: Phone Call Completed Summary of Call: Wanting to know results.  Negative results.  Jennet Maduro RN  October 04, 2007 11:35 AM

## 2010-04-09 NOTE — Miscellaneous (Signed)
Summary: Yearly Financial Assessment Check  Clinical Lists Changes  Observations: Added new observation of PCTFPL: 209.59  (03/10/2007 9:12)

## 2010-04-09 NOTE — Miscellaneous (Signed)
Summary: Lab Rpt: CD4  Clinical Lists Changes  Observations: Added new observation of CD4 COUNT: 1140 microliters (07/08/2006 14:25)

## 2010-04-09 NOTE — Letter (Signed)
Summary: [name] Smith      Imported By: Roderic Ovens 02/11/2010 11:38:48  _____________________________________________________________________  External Attachment:    Type:   Image     Comment:   External Document

## 2010-04-09 NOTE — Letter (Signed)
Summary: [name] Smith-03/26/2006  [name] Smith-03/26/2006   Imported By: Dorice Lamas 03/30/2006 11:23:45  _____________________________________________________________________  External Attachment:    Type:   Image     Comment:   External Document

## 2010-04-09 NOTE — Letter (Signed)
Summary: OV-07/03/2005  OV-07/03/2005   Imported By: Dorice Lamas 03/30/2006 11:16:59  _____________________________________________________________________  External Attachment:    Type:   Image     Comment:   External Document

## 2010-04-09 NOTE — Assessment & Plan Note (Signed)
Summary: f/u  MD running behind, pt. left without being seen  MD running behind in clinic.  Pt unable to stay for clinic visit.  Left without being seen. Jennet Maduro RN  December 21, 2008 11:08 AM      Current Allergies: No known allergies

## 2010-04-09 NOTE — Progress Notes (Signed)
Summary: Lab result request  Phone Note Call from Patient   Caller: [phone] Summary of Call:  Voice mail: Requesting lab results .  Pt's voicmail : call the office for info. requested.   Initial call taken by: Tomasita Morrow RN,  December 11, 2008 12:56 PM  Follow-up for Phone Call        Pt called again for labs. lab values given  Follow-up by: Tomasita Morrow RN,  December 18, 2008 10:08 AM

## 2010-04-09 NOTE — Assessment & Plan Note (Signed)
Summary: fukam 11:30   Chief Complaint:  ofc visit fatigue and tired.  History of Present Illness: [name] Smith has been doing well on Atripla for his HIV and viral load is <50 and CD4 960. He continues to smoke intermittently and knows of the adverse consequences to his CAD.  Current Allergies: No known allergies     Risk Factors: Tobacco use:  current    Vital Signs:  Patient Profile:   50 Years Old Male Height:     69 inches (175.26 cm) Weight:      171.3 pounds (77.86 kg) BMI:     25.39 Temp:     97.2 degrees F (36.22 degrees C) oral Pulse rate:   74 / minute BP sitting:   120 / 77  (right arm)  Pt. in pain?   no  Vitals Entered By: Clarise Cruz Duncan Dull) (October 22, 2006 11:26 AM)              Is Patient Diabetic? No Nutritional Status BMI of < 19 = underweight  Have you ever been in a relationship where you felt threatened, hurt or afraid?No   Does patient need assistance? Functional Status Self care Ambulation Normal   Physical Exam  General:     alert, well-developed, and well-nourished.   Head:     normocephalic.   Mouth:     good dentition and pharynx pink and moist.   Abdomen:     soft, non-tender, no hepatomegaly, and no splenomegaly.      Impression & Recommendations:  Problem # 1:  HIV INFECTION (ICD-042)  His updated medication list for this problem includes:    Atripla 600-200-300 Mg Tabs (Efavirenz-emtricitab-tenofovir) .Marland Kitchen... Take 1 tablet by mouth once a day He is doing well with supressed HIV and high CD4 count,.RTC in 4 months. Orders: Est. Patient Level III (82956)    Patient Instructions: 1)  Please schedule a follow-up appointment in 3 months. 2)  Be sure to return for lab work one (1) week before your next appointment as scheduled.

## 2010-04-09 NOTE — Progress Notes (Signed)
Summary: needs refill on nitro  Medications Added NITROGLYCERIN 0.4 MG SUBL (NITROGLYCERIN) One tablet under tongue every 5 minutes as needed for chest pain---may repeat times three       Phone Note Refill Request Call back at Home Phone [phone] Message from:  Patient on October 03, 2008 11:45 AM  Refills Requested: Medication #1:  nitroglycerine send to [facility] [phone]  Initial call taken by: Judie Grieve,  October 03, 2008 11:48 AM    New/Updated Medications: NITROGLYCERIN 0.4 MG SUBL (NITROGLYCERIN) One tablet under tongue every 5 minutes as needed for chest pain---may repeat times three Prescriptions: NITROGLYCERIN 0.4 MG SUBL (NITROGLYCERIN) One tablet under tongue every 5 minutes as needed for chest pain---may repeat times three  #30 x 12   Entered by:   Kem Parkinson   Authorized by:   Colon Branch, MD, [facility]   Signed by:   Kem Parkinson on 10/03/2008   Method used:   [facility]        [facility] [phone][facility] (retail)       [address] [place], [place]  [address]       Ph: [phone]       Fax: [phone]   RxID:   [id number]

## 2010-04-09 NOTE — Progress Notes (Signed)
Summary: Resent electronically Atripla refill 01-11-07/mld  Phone Note Refill Request        Prescriptions: ATRIPLA 600-200-300 MG TABS (EFAVIRENZ-EMTRICITAB-TENOFOVIR) Take 1 tablet by mouth once a day  #30 x 11   Entered by:   Paulo Fruit   Authorized by:   Lina Sayre MD   Signed by:   Paulo Fruit on 01/13/2007   Method used:   Electronically sent to ...       [facility] [phone]*       [phone]       [place], [place]  [address]       Ph: [phone] or [phone]       Fax: [phone]   RxID:   [id number]  Resent electronicallly ...................................................................Paulo Fruit  January 13, 2007 11:51 AM

## 2010-04-09 NOTE — Letter (Signed)
Summary: Initial OV-02/19/1996  Initial OV-02/19/1996   Imported By: Dorice Lamas 03/30/2006 11:12:22  _____________________________________________________________________  External Attachment:    Type:   Image     Comment:   External Document

## 2010-04-09 NOTE — Letter (Signed)
Summary: MC: Physician Documentation Sheet  MC: Physician Documentation Sheet   Imported By: Earl Many 01/24/2010 11:36:46  _____________________________________________________________________  External Attachment:    Type:   Image     Comment:   External Document

## 2010-04-09 NOTE — Progress Notes (Signed)
Summary: Medication - Prozac refiil OKed  Phone Note From Pharmacy   Caller: Gweneth Dimitri [phone] Reason for Call: Needs renewal Summary of Call: Received a fax refill request for Fluoxetine 20mg  Take 1 capsule once daily # 30 Last filled 10/05/07 and written 10/22/06.   This medication is not listed in your dictation nor in patient's med problem list.  Is patient supposed to be taking this medication?  How many refills would you like to authorize?  I believe patient is not due to see you until January 2010.  Medication also needs to be added into EMR so it may be processed to [facility] Dr. Ginette Otto. Initial call taken by: Paulo Fruit  BS,CPht II,MPH,  November 04, 2007 12:49 PM    New/Updated Medications: FLUOXETINE HCL 10 MG CAPS (FLUOXETINE HCL) Take 1 capsule by mouth once a day  oked by Dr. Maurice March for refill rx for pt.  Jennet Maduro RN  November 18, 2007 5:00 PM   Prescriptions: FLUOXETINE HCL 10 MG CAPS (FLUOXETINE HCL) Take 1 capsule by mouth once a day  #30 x prn   Entered by:   Jennet Maduro RN   Authorized by:   Lina Sayre MD   Signed by:   Jennet Maduro RN on 11/18/2007   Method used:   Electronically to        [facility] [phone]* (retail)       [address]       [place], [place]  [address]       Ph: [phone]       Fax: [phone]   RxID:   [id number]   Appended Document: Medication - Prozac refiil OKed Prozac rx sent to the wrong pharmacy.  Called in to the [facility] on [address].

## 2010-04-09 NOTE — Assessment & Plan Note (Signed)
Summary: f/u ov   Chief Complaint:  f/u  possible sinus infection.  History of Present Illness: [name] Smith is doing well with suppressed HIV and CD4 is 830. Adherent to ARV and safer sex. Hasbeen having symptoms of thrinitis and will prescribe Nasocort and renew. Will renew hydrocondone. Will see in 4 months.    Current Allergies: No known allergies     Risk Factors: Tobacco use:  current Alcohol use:  yes    Type:  wine    Drinks per day:  1    Vital Signs:  Patient Profile:   50 Years Old Male Height:     69 inches (175.26 cm) Weight:      170 pounds (77.27 kg) BMI:     25.20 Temp:     97.8 degrees F (36.56 degrees C) oral Pulse rate:   76 / minute BP sitting:   126 / 77  (left arm)  Pt. in pain?   no  Vitals Entered By: Starleen Arms CMA (September 29, 2007 2:08 PM)              Is Patient Diabetic? No Nutritional Status BMI of 25 - 29 = overweight Nutritional Status Detail normal  Have you ever been in a relationship where you felt threatened, hurt or afraid?No   Does patient need assistance? Functional Status Self care Ambulation Normal       Impression & Recommendations:  Problem # 1:  HIV INFECTION (ICD-042)  His updated medication list for this problem includes:    Atripla 600-200-300 Mg Tabs (Efavirenz-emtricitab-tenofovir) .Marland Kitchen... Take 1 tablet by mouth once a day  Orders: T-Hepatitis A Antibody (40981-19147) Est. Patient Level IV (82956) T-CBC w/Diff (21308-65784) T-GC Probe, urine (69629-52841) T-Chlamydia  Probe, urine (32440-10272)  Future Orders: T-CD4 (53664-40347) ... 03/27/2008 T-HIV Viral Load [phone]) ... 03/27/2008 T-Comprehensive Metabolic Panel [id number]) ... 03/27/2008  Orders: T-Hepatitis A Antibody (41660-63016) T-Hepatitis A Antibody (01093-23557) T-Hepatitis A Antibody (32202-54270) T-Hepatitis A Antibody (62376-28315) T-Hepatitis A Antibody (17616-07371) T-Hepatitis A Antibody (06269-48546)   Other  Orders: Pneumococcal Vaccine (27035) Admin 1st Vaccine (00938) T-Urinalysis (18299-37169)   Patient Instructions: 1)  Please schedule a follow-up appointment in 5 months. 2)  Be sure to return for lab work one (1) week before your next appointment as scheduled.   Prescriptions: HYDROCODONE-ACETAMINOPHEN 5-500 MG  TABS (HYDROCODONE-ACETAMINOPHEN) Take 1 tablet by mouth every 6 hours as needed  #60 x 4   Entered by:   Tomasita Morrow RN   Authorized by:   Lina Sayre MD   Signed by:   Tomasita Morrow RN on 09/29/2007   Method used:   Telephoned to ...       [facility] [phone]* (retail)       [phone]       [place], [place]  [address]       Ph: [phone]       Fax: [phone]   RxID:   [id number] HYDROCODONE-ACETAMINOPHEN 5-500 MG  TABS (HYDROCODONE-ACETAMINOPHEN) Take 1 tablet by mouth every 6 hours as needed  #60 x 4   Entered and Authorized by:   Lina Sayre MD   Signed by:   Lina Sayre MD on 09/29/2007   Method used:   Electronically to        [facility] [phone]* (retail)       [address]       [place], [place]  [address]       Ph: [phone]  Fax: [phone]   RxID:   [id number]  Original Script sent electronically which was returned as error.  Script telephoned to pharmacy.  Pneumovax Vaccine    Vaccine Type: Pneumovax    Site: left deltoid    Mfr: Merck    Dose: 0.5 ml    Route: IM    Given by: Tomasita Morrow RN    Exp. Date: 09/29/2007    Lot #: [id number]    VIS given: 10/06/95 version given September 29, 2007.        Impression & Recommendations:  Problem # 1:  HIV INFECTION (ICD-042)  His updated medication list for this problem includes:    Atripla 600-200-300 Mg Tabs (Efavirenz-emtricitab-tenofovir) .Marland Kitchen... Take 1 tablet by mouth once a day  Orders: T-Hepatitis A Antibody (43329-51884) Est. Patient Level IV ([address]606) T-CBC w/Diff (30160-10932) T-GC Probe, urine (35573-22025) T-Chlamydia  Probe, urine (42706-23762)  Future  Orders: T-CD4 (83151-76160) ... 03/27/2008 T-HIV Viral Load [id number]) ... 03/27/2008 T-Comprehensive Metabolic Panel [id number]) ... 03/27/2008  Orders: T-Hepatitis A Antibody (09381-82993) T-Hepatitis A Antibody (71696-78938) T-Hepatitis A Antibody (10175-10258) T-Hepatitis A Antibody (52778-24235) T-Hepatitis A Antibody (36144-31540) T-Hepatitis A Antibody (08676-19509)   Other Orders: Pneumococcal Vaccine (32671) Admin 1st Vaccine (24580) T-Urinalysis (99833-82505)

## 2010-04-09 NOTE — Miscellaneous (Signed)
Summary: clinical update/ryan white  Clinical Lists Changes  Observations: Added new observation of PCTFPL: 165.00  (09/07/2009 9:44) Added new observation of HOUSEINCOME: 16109  (09/07/2009 9:44) Added new observation of FINASSESSDT: 08/30/2009  (09/07/2009 9:44) Added new observation of YEARLYEXPEN: 60454  (09/07/2009 9:44) Added new observation of RW VITAL STA: Active  (09/07/2009 9:44)

## 2010-04-09 NOTE — Progress Notes (Signed)
Summary: Atripla refill 01-13-07/mld  Phone Note Refill Request Message from:  Pharmacy on January 13, 2007 9:40 AM  Refills Requested: Medication #1:  ATRIPLA 600-200-300 MG TABS Take 1 tablet by mouth once a day.   Last Refilled: 12/08/2006  Method Requested: Electronic Initial call taken by: Angelina Ok RN,  January 13, 2007 9:40 AM      Prescriptions: ATRIPLA 600-200-300 MG TABS (EFAVIRENZ-EMTRICITAB-TENOFOVIR) Take 1 tablet by mouth once a day  #30 x 11   Entered by:   Paulo Fruit   Authorized by:   RxRefill Nurse ID   Signed by:   Paulo Fruit on 01/13/2007   Method used:   Electronically sent to ...       [facility] [phone]*       [phone]       [place], [place]  [address]       Ph: [phone] or [phone]       Fax: [phone]   RxID:   [id number]  ...................................................................Paulo Fruit  January 13, 2007 11:00 AM

## 2010-04-09 NOTE — Progress Notes (Signed)
Summary: Atbx request   Phone Note Call from Patient   Caller: [phone] Summary of Call: Pt states he is having green sinus drainage for 3 days . he is requesting an Antibiotic.    [facility] [address]      New/Updated Medications: AZITHROMYCIN 500 MG TABS (AZITHROMYCIN) Take 2 tablets by mouth on the first day, then 1 tablet by mouth until gone. Prescriptions: AZITHROMYCIN 500 MG TABS (AZITHROMYCIN) Take 2 tablets by mouth on the first day, then 1 tablet by mouth until gone.  #6 x 0   Entered by:   Jennet Maduro RN   Authorized by:   Lina Sayre MD   Signed by:   Jennet Maduro RN on 07/05/2009   Method used:   Electronically to        [facility] [address] # [phone]* (retail)       [address] [place], [place]  [address]       Ph: [phone]       Fax: [phone]   RxID:   [id number]

## 2010-04-09 NOTE — Assessment & Plan Note (Signed)
Summary: resfrom5/8kam   Chief Complaint:  follow up visit and has kidney stones.  History of Present Illness: [name] Smith is here for HIV f/u and has no complaints.He is nicely suppressed on Atripla with HIV 55 copies and CD4>1000. I have updated his med list. New Problem: L sided renal stone which is recurrent problefrom childhood and just learned about. Will need to enter in problem list when I have time.  Current Allergies: No known allergies     Risk Factors:  Tobacco use:  current    Vital Signs:  Patient Profile:   50 Years Old Male Height:     69 inches (175.26 cm) Weight:      170.5 pounds (77.50 kg) BMI:     25.27 Temp:     97.6 degrees F (36.44 degrees C) oral Pulse rate:   52 / minute BP sitting:   129 / 88  (left arm)  Pt. in pain?   no  Vitals Entered By: Clarise Cruz Duncan Dull) (Jul 14, 2006 10:37 AM)                Physical Exam  General:     alert.   Head:     Normocephalic and atraumatic without obvious abnormalities. No apparent alopecia or balding. Mouth:     good dentition, no gingival abnormalities, and pharynx pink and moist.      Impression & Recommendations:  Problem # 1:  HIV INFECTION (ICD-042) Assessment: Unchanged HIV suppressed and CD4>1000.HIV is under excellent control. Diagnostics Reviewed:  CD4: 800 (03/16/2006)   Hgb: 15.8 (03/16/2006)   HCT: 46.4 (03/16/2006)   HIV-1 RNA: 67 (03/16/2006)   HBSAg: No (05/04/2006)  Orders: Est. Patient Level III (52841) T-Comprehensive Metabolic Panel (32440-10272) T-CD4 (53664-40347) T-CBC w/Diff (42595-63875) T-HIV Viral Load (64332-95188)  His updated medication list for this problem includes:    Atripla 600-200-300 Mg Tabs (Efavirenz-emtricitab-tenofovir) .Marland Kitchen... Take 1 tablet by mouth once a day   Medications Added to Medication List This Visit: 1)  Crestor 40 Mg Tabs (Rosuvastatin calcium) .... Once daily tab 2)  Plavix 75 Mg Tabs (Clopidogrel bisulfate) .... Once daily tab 3)   Atripla 600-200-300 Mg Tabs (Efavirenz-emtricitab-tenofovir) .... Take 1 tablet by mouth once a day   Patient Instructions: 1)  Please schedule a follow-up appointment in 4 months. 2)  Be sure to return for lab work 2 weeks  before your next appointment as scheduled.

## 2010-04-11 NOTE — Miscellaneous (Signed)
Summary: [facility]   [facility]   Imported By: Roderic Ovens 03/05/2010 16:32:50  _____________________________________________________________________  External Attachment:    Type:   Image     Comment:   External Document

## 2010-04-11 NOTE — Progress Notes (Signed)
Summary: refill  Phone Note Call from Patient Call back at Home Phone [phone]   Caller: Patient Reason for Call: Acute Illness, Talk to Nurse Summary of Call: Sinus infection symptoms.  Requesting a different medication from a Z-Pak.  Z-Pak causes stomach upset.  RN spoke w/ Dr. Daiva Eves. Recieved order for Augmentin 875 mg Take 1 tablet by mouth two times a day. Jennet Maduro RN  April 01, 2010 12:02 PM      New/Updated Medications: AMOXICILLIN-POT CLAVULANATE 875-125 MG TABS (AMOXICILLIN-POT CLAVULANATE) Take 1 tablet by mouth two times a day Prescriptions: AMOXICILLIN-POT CLAVULANATE 875-125 MG TABS (AMOXICILLIN-POT CLAVULANATE) Take 1 tablet by mouth two times a day  #20 x 0   Entered by:   Jennet Maduro RN   Authorized by:   Acey Lav MD   Signed by:   Jennet Maduro RN on 04/01/2010   Method used:   Electronically to        [facility] # [phone]* (retail)       [address] [place], [place]  [address]       Ph: [phone]       Fax: [phone]   RxID:   [id number]

## 2010-04-11 NOTE — Miscellaneous (Signed)
Summary: [facility]  [facility]   Imported By: Florinda Marker 03/06/2010 16:09:33  _____________________________________________________________________  External Attachment:    Type:   Image     Comment:   External Document

## 2010-04-11 NOTE — Miscellaneous (Signed)
Summary: [facility]   [facility]   Imported By: Roderic Ovens 02/20/2010 10:44:08  _____________________________________________________________________  External Attachment:    Type:   Image     Comment:   External Document

## 2010-04-11 NOTE — Progress Notes (Addendum)
  Phone Note From Pharmacy   Caller: [phone][facility]  Summary of Call: Script written incorrectly.  Wrong dose , strength and medication. This patient usually gets hydrocodone 5/500 one every 6 hours as needed for pain # 120 with 5 refills.  SCript written  Percocet 5/500  #120. with 5 refills.  Requested copy of script to verify.  They will fax the script for me to verify or change.  This medication was removed on 11-06-09 per emr.  Tomasita Morrow RN  March 18, 2010 9:54 AM   Follow-up for Phone Call        Script received  via fax and  will need corrections.    Ok for hydrocodone  5/500 mg one every 6 hours as needed pain  #120   must last 30 days.   Medication is not listed in EMR  as active. Drug history came from pharmacy and verified.   Medication added to list . Tomasita Morrow RN  March 18, 2010 10:11 AM  2 refils given. Follow-up by: Tomasita Morrow RN,  March 18, 2010 10:09 AM    New/Updated Medications: HYDROCODONE-ACETAMINOPHEN 5-500 MG TABS (HYDROCODONE-ACETAMINOPHEN) take one tablet every 6 hours as needed for pain . Must last 30 days Prescriptions: HYDROCODONE-ACETAMINOPHEN 5-500 MG TABS (HYDROCODONE-ACETAMINOPHEN) take one tablet every 6 hours as needed for pain . Must last 30 days  #120 x 0   Entered by:   Tomasita Morrow RN   Authorized by:   Lina Sayre MD   Signed by:   Tomasita Morrow RN on 03/18/2010   Method used:   Telephoned to ...       [facility] [address] [facility] [address]* (retail)       [address] [place], [place]  [address]       Ph: [phone]       Fax: [phone]   RxID:   [id number]

## 2010-04-11 NOTE — Progress Notes (Signed)
Summary: [facility]: Office Visit  [facility]: Office Visit   Imported By: Earl Many 03/19/2010 16:31:44  _____________________________________________________________________  External Attachment:    Type:   Image     Comment:   External Document

## 2010-04-12 NOTE — Miscellaneous (Signed)
Summary: [facility] Physician Order/Treatment Plan   [facility] Physician Order/Treatment Plan   Imported By: Roderic Ovens 01/22/2010 11:01:38  _____________________________________________________________________  External Attachment:    Type:   Image     Comment:   External Document

## 2010-04-12 NOTE — Consult Note (Signed)
Summary: [facility]   [place]   Imported By: Roderic Ovens 01/02/2010 12:35:58  _____________________________________________________________________  External Attachment:    Type:   Image     Comment:   External Document

## 2010-04-23 ENCOUNTER — Encounter: Payer: Self-pay | Admitting: Cardiovascular Disease

## 2010-04-23 ENCOUNTER — Ambulatory Visit (INDEPENDENT_AMBULATORY_CARE_PROVIDER_SITE_OTHER): Payer: BC Managed Care – PPO | Admitting: Cardiovascular Disease

## 2010-04-23 ENCOUNTER — Other Ambulatory Visit: Payer: Self-pay | Admitting: Cardiovascular Disease

## 2010-04-23 DIAGNOSIS — I2510 Atherosclerotic heart disease of native coronary artery without angina pectoris: Secondary | ICD-10-CM

## 2010-04-23 DIAGNOSIS — E785 Hyperlipidemia, unspecified: Secondary | ICD-10-CM

## 2010-04-23 DIAGNOSIS — I10 Essential (primary) hypertension: Secondary | ICD-10-CM

## 2010-04-23 DIAGNOSIS — E7800 Pure hypercholesterolemia, unspecified: Secondary | ICD-10-CM

## 2010-04-23 LAB — HEPATIC FUNCTION PANEL
ALT: 24 U/L (ref 0–53)
AST: 29 U/L (ref 0–37)
Albumin: 4 g/dL (ref 3.5–5.2)
Alkaline Phosphatase: 76 U/L (ref 39–117)
Bilirubin, Direct: 0.2 mg/dL (ref 0.0–0.3)
Total Bilirubin: 0.6 mg/dL (ref 0.3–1.2)
Total Protein: 7.4 g/dL (ref 6.0–8.3)

## 2010-04-23 LAB — LIPID PANEL
Cholesterol: 223 mg/dL — ABNORMAL HIGH (ref 0–200)
HDL: 59.1 mg/dL (ref >39.00)
Total CHOL/HDL Ratio: 4
Triglycerides: 236 mg/dL — ABNORMAL HIGH (ref 0.0–149.0)
VLDL: 47.2 mg/dL — ABNORMAL HIGH (ref 0.0–40.0)

## 2010-04-23 LAB — LDL CHOLESTEROL, DIRECT: Direct LDL: 143.2 mg/dL

## 2010-04-24 ENCOUNTER — Encounter: Payer: Self-pay | Admitting: Infectious Diseases

## 2010-05-01 NOTE — Medication Information (Signed)
Summary: RX Folder 02/07/10  RX Folder 02/07/10   Imported By: Florinda Marker 04/24/2010 09:30:32  _____________________________________________________________________  External Attachment:    Type:   Image     Comment:   External Document

## 2010-05-01 NOTE — Assessment & Plan Note (Signed)
Summary: PER CHECK OUT/SF/BBH unable to confirm appt lmom=mj  Medications Added ALPRAZOLAM 0.25 MG TABS (ALPRAZOLAM) 1 at bedtime as needed BIAXIN 250 MG TABS (CLARITHROMYCIN) one tablet by mouth two times a day x 7 days PROVENTIL HFA 108 (90 BASE) MCG/ACT AERS (ALBUTEROL SULFATE) one to two puffs as needed wheezing      Allergies Added: NKDA  History of Present Illness: [name] Smith is seen today for F/U of CAD/CABG.  He continues to struggle to quit smoking.  He tried Chantix for 4 days but felt he got a rash.  He is developing COPD with wheezing and needs an inhaler.  No fever cough or sputum  Thinks his sinuses are congested with headache and nasal congestion.  On atripla and discussed with Dr Maurice March simvastatin would be best statin.  He needs lipid and liver today.  Complains of leg pain and may benefit from lower dose.  No history of PVD.  No angina.  Working out at [employer].  Counseled on smoking cessation for less than 10 minutes  Current Problems (verified): 1)  Smoker  (ICD-305.1) 2)  Cough  (ICD-786.2) 3)  Hypertension  (ICD-401.9) 4)  Hx of Coronary Artery Bypass Graft, Three Vessel, Hx of  (ICD-V45.81) 5)  Coronary Artery Disease  (ICD-414.00) 6)  Hypercholesterolemia  (ICD-272.0) 7)  Need Prophylactic Vaccination&inoculation Flu  (ICD-V04.81) 8)  HIV Infection  (ICD-042) 9)  Nephrolithiasis  (ICD-592.0) 10)  Gerd  (ICD-530.81) 11)  Anxiety  (ICD-300.00)  Current Medications (verified): 1)  Aspirin Ec 325 Mg Tbec (Aspirin) .... Take One Tablet By Mouth Daily 2)  Plavix 75 Mg Tabs (Clopidogrel Bisulfate) .... Once Daily Tab 3)  Atripla 600-200-300 Mg Tabs (Efavirenz-Emtricitab-Tenofovir) .... Take 1 Tablet By Mouth Once A Day 4)  Fluoxetine Hcl 10 Mg Caps (Fluoxetine Hcl) .... Take 1 Capsule By Mouth Once A Day 5)  Nitroglycerin 0.4 Mg Subl (Nitroglycerin) .... One Tablet Under Tongue Every 5 Minutes As Needed For Chest Pain---May Repeat Times Three 6)  Folic Acid  .Marland Kitchen.. 1 Tab By Mouth Once Daily 7)  Metoprolol Succinate 50 Mg Xr24h-Tab (Metoprolol Succinate) .... Take One Tablet By Mouth Daily 8)  Nicoderm Cq 21 Mg/24hr Pt24 (Nicotine) .... As Directed 9)  Alprazolam 0.25 Mg Tabs (Alprazolam) .Marland Kitchen.. 1 At Bedtime As Needed 10)  Chantix Starting Month Pak 0.5 Mg X 11 & 1 Mg X 42 Tabs (Varenicline Tartrate) .... As Directed 11)  Hydrocodone-Acetaminophen 5-500 Mg Tabs (Hydrocodone-Acetaminophen) .... Take One Tablet Every 6 Hours As Needed For Pain . Must Last 30 Days 12)  Simvastatin 80 Mg Tabs (Simvastatin) .... Take 1 Tablet By Mouth At Bedtime 13)  Biaxin 250 Mg Tabs (Clarithromycin) .... One Tablet By Mouth Two Times A Day X 7 Days  Allergies (verified): No Known Drug Allergies  Past History:  Past Medical History: Last updated: 11/03/2008 Current Problems:  HYPERTENSION (ICD-401.9) Hx of CORONARY ARTERY BYPASS GRAFT, THREE VESSEL, HX OF (ICD-V45.81) CORONARY ARTERY DISEASE (ICD-414.00) HYPERCHOLESTEROLEMIA (ICD-272.0) NEED PROPHYLACTIC VACCINATION&INOCULATION FLU (ICD-V04.81) HIV INFECTION (ICD-042) NEPHROLITHIASIS (ICD-592.0) GERD (ICD-530.81) ANXIETY (ICD-300.00) Coronary arterial disease  CABG-12/1999  Past Surgical History: Last updated: 01/24/2010  Redo coronary artery bypass grafting x4 (sequential saphenous vein  graft to posterior descending and posterolateral branch of the right coronary, saphenous vein graft to ramus intermediate, saphenous vein graft to distal LAD).  Endoscopic harvest of left leg greater saphenous vein. SURGEON:  [name], MD ASSISTANT:  Doree Fudge, PA ANESTHESIA:  General by Dr. Arta Bruce. Kathlee Nations  Maudie Flakes, M.D.  [name], M.D. PV/MEDQ  D:  12/28/2009  T:  12/29/2009  Job:  [id number] cc:   [name], MD, [facility]          failed stents and requiring bypass surgery.   CABG 2001  (LIMA to the LAD, SVG to ramusintermediate, SVG to diagonal, SVG to circumflex),   PTCA of the right coronary  artery with overlapping Mini Vision non-DES  stents. [name], M.D. [facility]  Electronically Signed TDS/MEDQ  D:  11/23/2004  T:  11/24/2004  Job:  [id number]  Family History: Last updated: 11/03/2008 The family history is positive for coronary disease.  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 Maurice March  Review of Systems       Denies fever, malais, weight loss, blurry vision, decreased visual acuity, cough, sputum, SOB, hemoptysis, pleuritic pain, palpitaitons, heartburn, abdominal pain, melena, lower extremity edema, claudication, or rash.   Vital Signs:  Patient profile:   50 year old male Height:      69 inches Weight:      160 pounds BMI:     23.71 Pulse rate:   80 / minute Resp:     14 per minute BP sitting:   135 / 80  (left arm)  Vitals Entered By: Kem Parkinson (April 23, 2010 9:07 AM)  Physical Exam  General:  Affect appropriate Healthy:  appears stated age HEENT: normal Neck supple with no adenopathy JVP normal no bruits no thyromegaly Lungs clear with wheezing and good diaphragmatic motion Heart:  S1/S2 no murmur,rub, gallop or click PMI normal Abdomen: benighn, BS positve, no tenderness, no AAA no bruit.  No HSM or HJR Distal pulses intact with no bruits No edema Neuro non-focal Skin warm and dry    Impression & Recommendations:  Problem # 1:  SMOKER (ICD-305.1) Will resume Chantix and see if rash was related.  Inhaler called in for wheezing.  F/U Dr Maurice March for CXR in future  Problem # 2:  COUGH (ICD-786.2) Biaxn for ? sinusitis F/U primary if worsens or has fever His updated medication list for this problem includes:    Aspirin Ec 325 Mg Tbec (Aspirin) .Marland Kitchen... Take one tablet by mouth daily    Plavix 75 Mg Tabs (Clopidogrel bisulfate) ..... Once daily tab    Nitroglycerin 0.4 Mg Subl (Nitroglycerin) ..... One tablet under tongue every 5 minutes as needed for  chest pain---may repeat times three    Metoprolol Succinate 50 Mg Xr24h-tab (Metoprolol succinate) .Marland Kitchen... Take one tablet by mouth daily  Problem # 3:  HYPERTENSION (ICD-401.9) Well contorlled His updated medication list for this problem includes:    Aspirin Ec 325 Mg Tbec (Aspirin) .Marland Kitchen... Take one tablet by mouth daily    Metoprolol Succinate 50 Mg Xr24h-tab (Metoprolol succinate) .Marland Kitchen... Take one tablet by mouth daily  Problem # 4:  Hx of CORONARY ARTERY BYPASS GRAFT, THREE VESSEL, HX OF (ICD-V45.81) No angina continue current meds  Problem # 5:  HYPERCHOLESTEROLEMIA (ICD-272.0) Labs today will see if he can decrease does.  Atripla will lessen effectiveness His updated medication list for this problem includes:    Simvastatin 80 Mg Tabs (Simvastatin) .Marland Kitchen... Take 1 tablet by mouth at bedtime  Orders: TLB-Lipid Panel (80061-LIPID) TLB-Hepatic/Liver Function Pnl (80076-HEPATIC)  Patient Instructions: 1)  Your physician wants you to follow-up in: 6 MONTHS  You will receive a reminder letter in the mail  two months in advance. If you don't receive a letter, please call our office to schedule the follow-up appointment. Prescriptions: PROVENTIL HFA 108 (90 BASE) MCG/ACT AERS (ALBUTEROL SULFATE) one to two puffs as needed wheezing  #1 x 1   Entered by:   Deliah Goody, RN   Authorized by:   Colon Branch, MD, [facility]   Signed by:   Deliah Goody, RN on 04/23/2010   Method used:   Electronically to        [facility] # [phone]* (retail)       [address] [place], [place]  [address]       Ph: [phone]       Fax: [phone]   RxID:   [id number] BIAXIN 250 MG TABS (CLARITHROMYCIN) one tablet by mouth two times a day x 7 days  #14 x 0   Entered by:   Deliah Goody, RN   Authorized by:   Colon Branch, MD, [facility]   Signed by:   Deliah Goody, RN on 04/23/2010   Method used:   Electronically to        [facility] # [phone]* (retail)       [address] [place], [place]  [address]       Ph: [phone]       Fax: [phone]   RxID:   [id number]

## 2010-05-06 ENCOUNTER — Encounter: Payer: Self-pay | Admitting: Infectious Diseases

## 2010-05-14 ENCOUNTER — Encounter: Payer: Self-pay | Admitting: Infectious Diseases

## 2010-05-16 NOTE — Miscellaneous (Signed)
Summary: [place] [facility]  [place] [facility]   Imported By: Florinda Marker 05/08/2010 15:14:48  _____________________________________________________________________  External Attachment:    Type:   Image     Comment:   External Document

## 2010-05-21 ENCOUNTER — Telehealth: Payer: Self-pay | Admitting: *Deleted

## 2010-05-21 LAB — T-HELPER CELL (CD4) - (RCID CLINIC ONLY)
CD4 % Helper T Cell: 35 % (ref 33–55)
CD4 T Cell Abs: 900 uL (ref 400–2700)

## 2010-05-22 LAB — GLUCOSE, CAPILLARY
Glucose-Capillary: 107 mg/dL — ABNORMAL HIGH (ref 70–99)
Glucose-Capillary: 115 mg/dL — ABNORMAL HIGH (ref 70–99)
Glucose-Capillary: 118 mg/dL — ABNORMAL HIGH (ref 70–99)
Glucose-Capillary: 120 mg/dL — ABNORMAL HIGH (ref 70–99)
Glucose-Capillary: 122 mg/dL — ABNORMAL HIGH (ref 70–99)
Glucose-Capillary: 124 mg/dL — ABNORMAL HIGH (ref 70–99)
Glucose-Capillary: 130 mg/dL — ABNORMAL HIGH (ref 70–99)
Glucose-Capillary: 131 mg/dL — ABNORMAL HIGH (ref 70–99)
Glucose-Capillary: 131 mg/dL — ABNORMAL HIGH (ref 70–99)
Glucose-Capillary: 146 mg/dL — ABNORMAL HIGH (ref 70–99)
Glucose-Capillary: 86 mg/dL (ref 70–99)
Glucose-Capillary: 89 mg/dL (ref 70–99)
Glucose-Capillary: 89 mg/dL (ref 70–99)
Glucose-Capillary: 93 mg/dL (ref 70–99)
Glucose-Capillary: 98 mg/dL (ref 70–99)
Glucose-Capillary: 98 mg/dL (ref 70–99)

## 2010-05-22 LAB — CBC
HCT: 25.2 % — ABNORMAL LOW (ref 39.0–52.0)
HCT: 25.6 % — ABNORMAL LOW (ref 39.0–52.0)
HCT: 27.2 % — ABNORMAL LOW (ref 39.0–52.0)
HCT: 27.4 % — ABNORMAL LOW (ref 39.0–52.0)
HCT: 28.2 % — ABNORMAL LOW (ref 39.0–52.0)
HCT: 32.5 % — ABNORMAL LOW (ref 39.0–52.0)
HCT: 40.2 % (ref 39.0–52.0)
HCT: 40.8 % (ref 39.0–52.0)
HCT: 41.2 % (ref 39.0–52.0)
HCT: 42 % (ref 39.0–52.0)
HCT: 42 % (ref 39.0–52.0)
HCT: 42.2 % (ref 39.0–52.0)
HCT: 43 % (ref 39.0–52.0)
HCT: 42 % (ref 39.0–52.0)
HCT: 42 % (ref 39.0–52.0)
HCT: 47.1 % (ref 39.0–52.0)
Hemoglobin: 11.5 g/dL — ABNORMAL LOW (ref 13.0–17.0)
Hemoglobin: 14.1 g/dL (ref 13.0–17.0)
Hemoglobin: 14.4 g/dL (ref 13.0–17.0)
Hemoglobin: 14.4 g/dL (ref 13.0–17.0)
Hemoglobin: 14.6 g/dL (ref 13.0–17.0)
Hemoglobin: 14.6 g/dL (ref 13.0–17.0)
Hemoglobin: 14.8 g/dL (ref 13.0–17.0)
Hemoglobin: 14.8 g/dL (ref 13.0–17.0)
Hemoglobin: 8.6 g/dL — ABNORMAL LOW (ref 13.0–17.0)
Hemoglobin: 8.8 g/dL — ABNORMAL LOW (ref 13.0–17.0)
Hemoglobin: 9.1 g/dL — ABNORMAL LOW (ref 13.0–17.0)
Hemoglobin: 9.2 g/dL — ABNORMAL LOW (ref 13.0–17.0)
Hemoglobin: 9.9 g/dL — ABNORMAL LOW (ref 13.0–17.0)
Hemoglobin: 14.6 g/dL (ref 13.0–17.0)
Hemoglobin: 14.9 g/dL (ref 13.0–17.0)
Hemoglobin: 17.1 g/dL — ABNORMAL HIGH (ref 13.0–17.0)
MCH: 33.6 pg (ref 26.0–34.0)
MCH: 34.2 pg — ABNORMAL HIGH (ref 26.0–34.0)
MCH: 34.5 pg — ABNORMAL HIGH (ref 26.0–34.0)
MCH: 34.5 pg — ABNORMAL HIGH (ref 26.0–34.0)
MCH: 34.9 pg — ABNORMAL HIGH (ref 26.0–34.0)
MCH: 35 pg — ABNORMAL HIGH (ref 26.0–34.0)
MCH: 35.1 pg — ABNORMAL HIGH (ref 26.0–34.0)
MCH: 35.2 pg — ABNORMAL HIGH (ref 26.0–34.0)
MCH: 35.2 pg — ABNORMAL HIGH (ref 26.0–34.0)
MCH: 35.4 pg — ABNORMAL HIGH (ref 26.0–34.0)
MCH: 35.5 pg — ABNORMAL HIGH (ref 26.0–34.0)
MCH: 35.6 pg — ABNORMAL HIGH (ref 26.0–34.0)
MCH: 36.2 pg — ABNORMAL HIGH (ref 26.0–34.0)
MCH: 35.5 pg — ABNORMAL HIGH (ref 26.0–34.0)
MCH: 36.2 pg — ABNORMAL HIGH (ref 26.0–34.0)
MCH: 37.3 pg — ABNORMAL HIGH (ref 26.0–34.0)
MCHC: 33.5 g/dL (ref 30.0–36.0)
MCHC: 33.6 g/dL (ref 30.0–36.0)
MCHC: 33.6 g/dL (ref 30.0–36.0)
MCHC: 34.4 g/dL (ref 30.0–36.0)
MCHC: 34.6 g/dL (ref 30.0–36.0)
MCHC: 34.8 g/dL (ref 30.0–36.0)
MCHC: 34.9 g/dL (ref 30.0–36.0)
MCHC: 35 g/dL (ref 30.0–36.0)
MCHC: 35.1 g/dL (ref 30.0–36.0)
MCHC: 35.1 g/dL (ref 30.0–36.0)
MCHC: 35.2 g/dL (ref 30.0–36.0)
MCHC: 35.3 g/dL (ref 30.0–36.0)
MCHC: 35.4 g/dL (ref 30.0–36.0)
MCHC: 34.8 g/dL (ref 30.0–36.0)
MCHC: 35.5 g/dL (ref 30.0–36.0)
MCHC: 36.3 g/dL — ABNORMAL HIGH (ref 30.0–36.0)
MCV: 100.4 fL — ABNORMAL HIGH (ref 78.0–100.0)
MCV: 100.8 fL — ABNORMAL HIGH (ref 78.0–100.0)
MCV: 101.7 fL — ABNORMAL HIGH (ref 78.0–100.0)
MCV: 101.9 fL — ABNORMAL HIGH (ref 78.0–100.0)
MCV: 101.9 fL — ABNORMAL HIGH (ref 78.0–100.0)
MCV: 102 fL — ABNORMAL HIGH (ref 78.0–100.0)
MCV: 102.5 fL — ABNORMAL HIGH (ref 78.0–100.0)
MCV: 102.7 fL — ABNORMAL HIGH (ref 78.0–100.0)
MCV: 102.8 fL — ABNORMAL HIGH (ref 78.0–100.0)
MCV: 98.3 fL (ref 78.0–100.0)
MCV: 99.1 fL (ref 78.0–100.0)
MCV: 99.5 fL (ref 78.0–100.0)
MCV: 99.8 fL (ref 78.0–100.0)
MCV: 101.9 fL — ABNORMAL HIGH (ref 78.0–100.0)
MCV: 102.2 fL — ABNORMAL HIGH (ref 78.0–100.0)
MCV: 102.6 fL — ABNORMAL HIGH (ref 78.0–100.0)
Platelets: 157 10*3/uL (ref 150–400)
Platelets: 159 10*3/uL (ref 150–400)
Platelets: 160 10*3/uL (ref 150–400)
Platelets: 168 10*3/uL (ref 150–400)
Platelets: 169 10*3/uL (ref 150–400)
Platelets: 207 10*3/uL (ref 150–400)
Platelets: 223 10*3/uL (ref 150–400)
Platelets: 225 10*3/uL (ref 150–400)
Platelets: 239 10*3/uL (ref 150–400)
Platelets: 244 10*3/uL (ref 150–400)
Platelets: 265 10*3/uL (ref 150–400)
Platelets: 277 10*3/uL (ref 150–400)
Platelets: 306 10*3/uL (ref 150–400)
Platelets: 178 10*3/uL (ref 150–400)
Platelets: 183 10*3/uL (ref 150–400)
Platelets: 204 10*3/uL (ref 150–400)
RBC: 2.49 MIL/uL — ABNORMAL LOW (ref 4.22–5.81)
RBC: 2.5 MIL/uL — ABNORMAL LOW (ref 4.22–5.81)
RBC: 2.69 MIL/uL — ABNORMAL LOW (ref 4.22–5.81)
RBC: 2.71 MIL/uL — ABNORMAL LOW (ref 4.22–5.81)
RBC: 2.87 MIL/uL — ABNORMAL LOW (ref 4.22–5.81)
RBC: 3.28 MIL/uL — ABNORMAL LOW (ref 4.22–5.81)
RBC: 3.98 MIL/uL — ABNORMAL LOW (ref 4.22–5.81)
RBC: 4.04 MIL/uL — ABNORMAL LOW (ref 4.22–5.81)
RBC: 4.04 MIL/uL — ABNORMAL LOW (ref 4.22–5.81)
RBC: 4.11 MIL/uL — ABNORMAL LOW (ref 4.22–5.81)
RBC: 4.12 MIL/uL — ABNORMAL LOW (ref 4.22–5.81)
RBC: 4.21 MIL/uL — ABNORMAL LOW (ref 4.22–5.81)
RBC: 4.23 MIL/uL (ref 4.22–5.81)
RBC: 4.11 MIL/uL — ABNORMAL LOW (ref 4.22–5.81)
RBC: 4.12 MIL/uL — ABNORMAL LOW (ref 4.22–5.81)
RBC: 4.59 MIL/uL (ref 4.22–5.81)
RDW: 11.9 % (ref 11.5–15.5)
RDW: 11.9 % (ref 11.5–15.5)
RDW: 11.9 % (ref 11.5–15.5)
RDW: 11.9 % (ref 11.5–15.5)
RDW: 11.9 % (ref 11.5–15.5)
RDW: 11.9 % (ref 11.5–15.5)
RDW: 12 % (ref 11.5–15.5)
RDW: 12.3 % (ref 11.5–15.5)
RDW: 12.6 % (ref 11.5–15.5)
RDW: 12.8 % (ref 11.5–15.5)
RDW: 12.9 % (ref 11.5–15.5)
RDW: 12.9 % (ref 11.5–15.5)
RDW: 13.1 % (ref 11.5–15.5)
RDW: 11.9 % (ref 11.5–15.5)
RDW: 12 % (ref 11.5–15.5)
RDW: 12 % (ref 11.5–15.5)
WBC: 10 10*3/uL (ref 4.0–10.5)
WBC: 10.8 10*3/uL — ABNORMAL HIGH (ref 4.0–10.5)
WBC: 6.6 10*3/uL (ref 4.0–10.5)
WBC: 6.8 10*3/uL (ref 4.0–10.5)
WBC: 6.8 10*3/uL (ref 4.0–10.5)
WBC: 6.9 10*3/uL (ref 4.0–10.5)
WBC: 6.9 10*3/uL (ref 4.0–10.5)
WBC: 7.6 10*3/uL (ref 4.0–10.5)
WBC: 8.1 10*3/uL (ref 4.0–10.5)
WBC: 8.3 10*3/uL (ref 4.0–10.5)
WBC: 8.4 10*3/uL (ref 4.0–10.5)
WBC: 8.9 10*3/uL (ref 4.0–10.5)
WBC: 9.5 10*3/uL (ref 4.0–10.5)
WBC: 14.5 10*3/uL — ABNORMAL HIGH (ref 4.0–10.5)
WBC: 7.1 10*3/uL (ref 4.0–10.5)
WBC: 8.9 10*3/uL (ref 4.0–10.5)

## 2010-05-22 LAB — POCT I-STAT 4, (NA,K, GLUC, HGB,HCT)
Glucose, Bld: 138 mg/dL — ABNORMAL HIGH (ref 70–99)
Glucose, Bld: 153 mg/dL — ABNORMAL HIGH (ref 70–99)
Glucose, Bld: 157 mg/dL — ABNORMAL HIGH (ref 70–99)
Glucose, Bld: 91 mg/dL (ref 70–99)
Glucose, Bld: 99 mg/dL (ref 70–99)
Glucose, Bld: 99 mg/dL (ref 70–99)
Glucose, Bld: 99 mg/dL (ref 70–99)
HCT: 22 % — ABNORMAL LOW (ref 39.0–52.0)
HCT: 24 % — ABNORMAL LOW (ref 39.0–52.0)
HCT: 27 % — ABNORMAL LOW (ref 39.0–52.0)
HCT: 29 % — ABNORMAL LOW (ref 39.0–52.0)
HCT: 35 % — ABNORMAL LOW (ref 39.0–52.0)
HCT: 38 % — ABNORMAL LOW (ref 39.0–52.0)
HCT: 39 % (ref 39.0–52.0)
Hemoglobin: 11.9 g/dL — ABNORMAL LOW (ref 13.0–17.0)
Hemoglobin: 12.9 g/dL — ABNORMAL LOW (ref 13.0–17.0)
Hemoglobin: 13.3 g/dL (ref 13.0–17.0)
Hemoglobin: 7.5 g/dL — ABNORMAL LOW (ref 13.0–17.0)
Hemoglobin: 8.2 g/dL — ABNORMAL LOW (ref 13.0–17.0)
Hemoglobin: 9.2 g/dL — ABNORMAL LOW (ref 13.0–17.0)
Hemoglobin: 9.9 g/dL — ABNORMAL LOW (ref 13.0–17.0)
Potassium: 3.2 mEq/L — ABNORMAL LOW (ref 3.5–5.1)
Potassium: 3.7 mEq/L (ref 3.5–5.1)
Potassium: 3.8 mEq/L (ref 3.5–5.1)
Potassium: 3.9 mEq/L (ref 3.5–5.1)
Potassium: 4.2 mEq/L (ref 3.5–5.1)
Potassium: 4.3 mEq/L (ref 3.5–5.1)
Potassium: 5.4 mEq/L — ABNORMAL HIGH (ref 3.5–5.1)
Sodium: 137 mEq/L (ref 135–145)
Sodium: 137 mEq/L (ref 135–145)
Sodium: 138 mEq/L (ref 135–145)
Sodium: 138 mEq/L (ref 135–145)
Sodium: 138 mEq/L (ref 135–145)
Sodium: 139 mEq/L (ref 135–145)
Sodium: 145 mEq/L (ref 135–145)

## 2010-05-22 LAB — BLOOD GAS, ARTERIAL
Acid-Base Excess: 1.1 mmol/L (ref 0.0–2.0)
Bicarbonate: 24.5 mEq/L — ABNORMAL HIGH (ref 20.0–24.0)
Drawn by: 330991
O2 Saturation: 95.4 %
Patient temperature: 98.6
TCO2: 25.5 mmol/L (ref 0–100)
pCO2 arterial: 33.9 mmHg — ABNORMAL LOW (ref 35.0–45.0)
pH, Arterial: 7.471 — ABNORMAL HIGH (ref 7.350–7.450)
pO2, Arterial: 73.3 mmHg — ABNORMAL LOW (ref 80.0–100.0)

## 2010-05-22 LAB — CROSSMATCH
ABO/RH(D): A POS
Antibody Screen: NEGATIVE
Unit division: 0
Unit division: 0
Unit division: 0
Unit division: 0

## 2010-05-22 LAB — POCT I-STAT 3, ART BLOOD GAS (G3+)
Acid-base deficit: 1 mmol/L (ref 0.0–2.0)
Acid-base deficit: 1 mmol/L (ref 0.0–2.0)
Acid-base deficit: 1 mmol/L (ref 0.0–2.0)
Acid-base deficit: 3 mmol/L — ABNORMAL HIGH (ref 0.0–2.0)
Acid-base deficit: 5 mmol/L — ABNORMAL HIGH (ref 0.0–2.0)
Acid-base deficit: 6 mmol/L — ABNORMAL HIGH (ref 0.0–2.0)
Bicarbonate: 19.5 mEq/L — ABNORMAL LOW (ref 20.0–24.0)
Bicarbonate: 22.5 mEq/L (ref 20.0–24.0)
Bicarbonate: 23 mEq/L (ref 20.0–24.0)
Bicarbonate: 24.3 mEq/L — ABNORMAL HIGH (ref 20.0–24.0)
Bicarbonate: 24.7 mEq/L — ABNORMAL HIGH (ref 20.0–24.0)
Bicarbonate: 26.3 mEq/L — ABNORMAL HIGH (ref 20.0–24.0)
Bicarbonate: 26.4 mEq/L — ABNORMAL HIGH (ref 20.0–24.0)
O2 Saturation: 100 %
O2 Saturation: 87 %
O2 Saturation: 94 %
O2 Saturation: 96 %
O2 Saturation: 96 %
O2 Saturation: 98 %
O2 Saturation: 98 %
Patient temperature: 37.2
Patient temperature: 38.1
Patient temperature: 38.3
Patient temperature: 38.9
TCO2: 21 mmol/L (ref 0–100)
TCO2: 24 mmol/L (ref 0–100)
TCO2: 25 mmol/L (ref 0–100)
TCO2: 26 mmol/L (ref 0–100)
TCO2: 26 mmol/L (ref 0–100)
TCO2: 28 mmol/L (ref 0–100)
TCO2: 28 mmol/L (ref 0–100)
pCO2 arterial: 37.9 mmHg (ref 35.0–45.0)
pCO2 arterial: 42.1 mmHg (ref 35.0–45.0)
pCO2 arterial: 43 mmHg (ref 35.0–45.0)
pCO2 arterial: 50.3 mmHg — ABNORMAL HIGH (ref 35.0–45.0)
pCO2 arterial: 50.6 mmHg — ABNORMAL HIGH (ref 35.0–45.0)
pCO2 arterial: 56.3 mmHg — ABNORMAL HIGH (ref 35.0–45.0)
pCO2 arterial: 60.5 mmHg (ref 35.0–45.0)
pH, Arterial: 7.218 — ABNORMAL LOW (ref 7.350–7.450)
pH, Arterial: 7.254 — ABNORMAL LOW (ref 7.350–7.450)
pH, Arterial: 7.305 — ABNORMAL LOW (ref 7.350–7.450)
pH, Arterial: 7.319 — ABNORMAL LOW (ref 7.350–7.450)
pH, Arterial: 7.332 — ABNORMAL LOW (ref 7.350–7.450)
pH, Arterial: 7.336 — ABNORMAL LOW (ref 7.350–7.450)
pH, Arterial: 7.36 (ref 7.350–7.450)
pO2, Arterial: 119 mmHg — ABNORMAL HIGH (ref 80.0–100.0)
pO2, Arterial: 121 mmHg — ABNORMAL HIGH (ref 80.0–100.0)
pO2, Arterial: 200 mmHg — ABNORMAL HIGH (ref 80.0–100.0)
pO2, Arterial: 64 mmHg — ABNORMAL LOW (ref 80.0–100.0)
pO2, Arterial: 77 mmHg — ABNORMAL LOW (ref 80.0–100.0)
pO2, Arterial: 85 mmHg (ref 80.0–100.0)
pO2, Arterial: 93 mmHg (ref 80.0–100.0)

## 2010-05-22 LAB — COMPREHENSIVE METABOLIC PANEL
ALT: 170 U/L — ABNORMAL HIGH (ref 0–53)
AST: 105 U/L — ABNORMAL HIGH (ref 0–37)
Albumin: 3.5 g/dL (ref 3.5–5.2)
Alkaline Phosphatase: 91 U/L (ref 39–117)
BUN: 15 mg/dL (ref 6–23)
CO2: 24 mEq/L (ref 19–32)
Calcium: 9.4 mg/dL (ref 8.4–10.5)
Chloride: 108 mEq/L (ref 96–112)
Creatinine, Ser: 0.82 mg/dL (ref 0.4–1.5)
GFR calc Af Amer: >60 mL/min (ref >60)
GFR calc non Af Amer: >60 mL/min (ref >60)
Glucose, Bld: 91 mg/dL (ref 70–99)
Potassium: 4.1 mEq/L (ref 3.5–5.1)
Sodium: 139 mEq/L (ref 135–145)
Total Bilirubin: 0.5 mg/dL (ref 0.3–1.2)
Total Protein: 6.7 g/dL (ref 6.0–8.3)

## 2010-05-22 LAB — CK TOTAL AND CKMB (NOT AT ARMC)
CK, MB: 8.4 ng/mL (ref 0.3–4.0)
Relative Index: 6.6 — ABNORMAL HIGH (ref 0.0–2.5)
Total CK: 127 U/L (ref 7–232)

## 2010-05-22 LAB — URINALYSIS, MICROSCOPIC ONLY
Bilirubin Urine: NEGATIVE
Glucose, UA: NEGATIVE mg/dL
Hgb urine dipstick: NEGATIVE
Ketones, ur: NEGATIVE mg/dL
Leukocytes, UA: NEGATIVE
Nitrite: NEGATIVE
Protein, ur: NEGATIVE mg/dL
Specific Gravity, Urine: 1.013 (ref 1.005–1.030)
Urobilinogen, UA: 0.2 mg/dL (ref 0.0–1.0)
pH: 6.5 (ref 5.0–8.0)

## 2010-05-22 LAB — PREPARE PLATELETS
Unit division: 0
Unit division: 0
Unit division: 0

## 2010-05-22 LAB — URINE CULTURE
Colony Count: NO GROWTH
Culture  Setup Time: 201110132037
Culture: NO GROWTH

## 2010-05-22 LAB — HEPATIC FUNCTION PANEL
ALT: 42 U/L (ref 0–53)
ALT: 24 U/L (ref 0–53)
AST: 28 U/L (ref 0–37)
AST: 33 U/L (ref 0–37)
Albumin: 2.9 g/dL — ABNORMAL LOW (ref 3.5–5.2)
Albumin: 3.4 g/dL — ABNORMAL LOW (ref 3.5–5.2)
Alkaline Phosphatase: 66 U/L (ref 39–117)
Alkaline Phosphatase: 70 U/L (ref 39–117)
Bilirubin, Direct: 0.2 mg/dL (ref 0.0–0.3)
Bilirubin, Direct: 0.2 mg/dL (ref 0.0–0.3)
Indirect Bilirubin: 0.4 mg/dL (ref 0.3–0.9)
Indirect Bilirubin: 0.8 mg/dL (ref 0.3–0.9)
Total Bilirubin: 0.6 mg/dL (ref 0.3–1.2)
Total Bilirubin: 1 mg/dL (ref 0.3–1.2)
Total Protein: 6.4 g/dL (ref 6.0–8.3)
Total Protein: 6.6 g/dL (ref 6.0–8.3)

## 2010-05-22 LAB — BASIC METABOLIC PANEL
BUN: 13 mg/dL (ref 6–23)
BUN: 13 mg/dL (ref 6–23)
BUN: 15 mg/dL (ref 6–23)
BUN: 10 mg/dL (ref 6–23)
BUN: 12 mg/dL (ref 6–23)
CO2: 27 mEq/L (ref 19–32)
CO2: 28 mEq/L (ref 19–32)
CO2: 30 mEq/L (ref 19–32)
CO2: 26 mEq/L (ref 19–32)
CO2: 27 mEq/L (ref 19–32)
Calcium: 7.9 mg/dL — ABNORMAL LOW (ref 8.4–10.5)
Calcium: 8.2 mg/dL — ABNORMAL LOW (ref 8.4–10.5)
Calcium: 9.1 mg/dL (ref 8.4–10.5)
Calcium: 8.5 mg/dL (ref 8.4–10.5)
Calcium: 9.2 mg/dL (ref 8.4–10.5)
Chloride: 100 mEq/L (ref 96–112)
Chloride: 105 mEq/L (ref 96–112)
Chloride: 113 mEq/L — ABNORMAL HIGH (ref 96–112)
Chloride: 101 mEq/L (ref 96–112)
Chloride: 103 mEq/L (ref 96–112)
Creatinine, Ser: 0.89 mg/dL (ref 0.4–1.5)
Creatinine, Ser: 0.98 mg/dL (ref 0.4–1.5)
Creatinine, Ser: 1.05 mg/dL (ref 0.4–1.5)
Creatinine, Ser: 0.89 mg/dL (ref 0.4–1.5)
Creatinine, Ser: 1.07 mg/dL (ref 0.4–1.5)
GFR calc Af Amer: >60 mL/min (ref >60)
GFR calc Af Amer: >60 mL/min (ref >60)
GFR calc Af Amer: >60 mL/min (ref >60)
GFR calc Af Amer: >60 mL/min (ref >60)
GFR calc Af Amer: >60 mL/min (ref >60)
GFR calc non Af Amer: >60 mL/min (ref >60)
GFR calc non Af Amer: >60 mL/min (ref >60)
GFR calc non Af Amer: >60 mL/min (ref >60)
GFR calc non Af Amer: >60 mL/min (ref >60)
GFR calc non Af Amer: >60 mL/min (ref >60)
Glucose, Bld: 102 mg/dL — ABNORMAL HIGH (ref 70–99)
Glucose, Bld: 111 mg/dL — ABNORMAL HIGH (ref 70–99)
Glucose, Bld: 128 mg/dL — ABNORMAL HIGH (ref 70–99)
Glucose, Bld: 106 mg/dL — ABNORMAL HIGH (ref 70–99)
Glucose, Bld: 136 mg/dL — ABNORMAL HIGH (ref 70–99)
Potassium: 4.4 mEq/L (ref 3.5–5.1)
Potassium: 4.5 mEq/L (ref 3.5–5.1)
Potassium: 4.8 mEq/L (ref 3.5–5.1)
Potassium: 3.8 mEq/L (ref 3.5–5.1)
Potassium: 3.8 mEq/L (ref 3.5–5.1)
Sodium: 136 mEq/L (ref 135–145)
Sodium: 137 mEq/L (ref 135–145)
Sodium: 145 mEq/L (ref 135–145)
Sodium: 135 mEq/L (ref 135–145)
Sodium: 139 mEq/L (ref 135–145)

## 2010-05-22 LAB — CREATININE, SERUM
Creatinine, Ser: 0.92 mg/dL (ref 0.4–1.5)
Creatinine, Ser: 0.94 mg/dL (ref 0.4–1.5)
GFR calc Af Amer: >60 mL/min (ref >60)
GFR calc Af Amer: >60 mL/min (ref >60)
GFR calc non Af Amer: >60 mL/min (ref >60)
GFR calc non Af Amer: >60 mL/min (ref >60)

## 2010-05-22 LAB — APTT
aPTT: 30 seconds (ref 24–37)
aPTT: 35 seconds (ref 24–37)

## 2010-05-22 LAB — TROPONIN I: Troponin I: 1.82 ng/mL (ref 0.00–0.06)

## 2010-05-22 LAB — POCT I-STAT GLUCOSE
Glucose, Bld: 122 mg/dL — ABNORMAL HIGH (ref 70–99)
Operator id: 3406

## 2010-05-22 LAB — URINALYSIS, ROUTINE W REFLEX MICROSCOPIC
Bilirubin Urine: NEGATIVE
Glucose, UA: NEGATIVE mg/dL
Hgb urine dipstick: NEGATIVE
Ketones, ur: NEGATIVE mg/dL
Nitrite: NEGATIVE
Protein, ur: NEGATIVE mg/dL
Specific Gravity, Urine: 1.009 (ref 1.005–1.030)
Urobilinogen, UA: 0.2 mg/dL (ref 0.0–1.0)
pH: 6.5 (ref 5.0–8.0)

## 2010-05-22 LAB — DIFFERENTIAL
Basophils Absolute: 0 10*3/uL (ref 0.0–0.1)
Basophils Relative: 0 % (ref 0–1)
Eosinophils Absolute: 0.1 10*3/uL (ref 0.0–0.7)
Eosinophils Relative: 0 % (ref 0–5)
Lymphocytes Relative: 24 % (ref 12–46)
Lymphs Abs: 3.5 10*3/uL (ref 0.7–4.0)
Monocytes Absolute: 1 10*3/uL (ref 0.1–1.0)
Monocytes Relative: 7 % (ref 3–12)
Neutro Abs: 10 10*3/uL — ABNORMAL HIGH (ref 1.7–7.7)
Neutrophils Relative %: 69 % (ref 43–77)

## 2010-05-22 LAB — POCT I-STAT, CHEM 8
BUN: 14 mg/dL (ref 6–23)
BUN: 15 mg/dL (ref 6–23)
Calcium, Ion: 1.12 mmol/L (ref 1.12–1.32)
Calcium, Ion: 1.19 mmol/L (ref 1.12–1.32)
Chloride: 103 mEq/L (ref 96–112)
Chloride: 108 mEq/L (ref 96–112)
Creatinine, Ser: 0.8 mg/dL (ref 0.4–1.5)
Creatinine, Ser: 1 mg/dL (ref 0.4–1.5)
Glucose, Bld: 103 mg/dL — ABNORMAL HIGH (ref 70–99)
Glucose, Bld: 139 mg/dL — ABNORMAL HIGH (ref 70–99)
HCT: 28 % — ABNORMAL LOW (ref 39.0–52.0)
HCT: 35 % — ABNORMAL LOW (ref 39.0–52.0)
Hemoglobin: 11.9 g/dL — ABNORMAL LOW (ref 13.0–17.0)
Hemoglobin: 9.5 g/dL — ABNORMAL LOW (ref 13.0–17.0)
Potassium: 4.5 mEq/L (ref 3.5–5.1)
Potassium: 4.6 mEq/L (ref 3.5–5.1)
Sodium: 139 mEq/L (ref 135–145)
Sodium: 144 mEq/L (ref 135–145)
TCO2: 25 mmol/L (ref 0–100)
TCO2: 25 mmol/L (ref 0–100)

## 2010-05-22 LAB — POCT CARDIAC MARKERS
CKMB, poc: 5.7 ng/mL (ref 1.0–8.0)
Myoglobin, poc: 76.7 ng/mL (ref 12–200)
Troponin i, poc: 0.64 ng/mL (ref 0.00–0.09)

## 2010-05-22 LAB — PROTIME-INR
INR: 1.32 (ref 0.00–1.49)
INR: 0.89 (ref 0.00–1.49)
Prothrombin Time: 16.6 seconds — ABNORMAL HIGH (ref 11.6–15.2)
Prothrombin Time: 12.2 seconds (ref 11.6–15.2)

## 2010-05-22 LAB — RAPID URINE DRUG SCREEN, HOSP PERFORMED
Amphetamines: NOT DETECTED
Barbiturates: NOT DETECTED
Benzodiazepines: NOT DETECTED
Cocaine: NOT DETECTED
Opiates: POSITIVE — AB
Tetrahydrocannabinol: NOT DETECTED

## 2010-05-22 LAB — LIPID PANEL
Cholesterol: 193 mg/dL (ref 0–200)
Cholesterol: 210 mg/dL — ABNORMAL HIGH (ref 0–200)
HDL: 54 mg/dL (ref >39)
HDL: 56 mg/dL (ref >39)
LDL Cholesterol: 103 mg/dL — ABNORMAL HIGH (ref 0–99)
LDL Cholesterol: 98 mg/dL (ref 0–99)
Total CHOL/HDL Ratio: 3.6 RATIO
Total CHOL/HDL Ratio: 3.8 RATIO
Triglycerides: 204 mg/dL — ABNORMAL HIGH (ref <150)
Triglycerides: 253 mg/dL — ABNORMAL HIGH (ref <150)
VLDL: 41 mg/dL — ABNORMAL HIGH (ref 0–40)
VLDL: 51 mg/dL — ABNORMAL HIGH (ref 0–40)

## 2010-05-22 LAB — PREPARE FRESH FROZEN PLASMA
Unit division: 0
Unit division: 0

## 2010-05-22 LAB — HEMOGLOBIN A1C
Hgb A1c MFr Bld: 5.1 % (ref <5.7)
Mean Plasma Glucose: 100 mg/dL (ref <117)

## 2010-05-22 LAB — CARDIAC PANEL(CRET KIN+CKTOT+MB+TROPI)
CK, MB: 6.3 ng/mL (ref 0.3–4.0)
CK, MB: 8.3 ng/mL (ref 0.3–4.0)
Relative Index: 5.7 — ABNORMAL HIGH (ref 0.0–2.5)
Relative Index: 6.8 — ABNORMAL HIGH (ref 0.0–2.5)
Total CK: 110 U/L (ref 7–232)
Total CK: 122 U/L (ref 7–232)
Troponin I: 2.26 ng/mL (ref 0.00–0.06)
Troponin I: 2.77 ng/mL (ref 0.00–0.06)

## 2010-05-22 LAB — TSH: TSH: 1.792 u[IU]/mL (ref 0.350–4.500)

## 2010-05-22 LAB — MRSA PCR SCREENING: MRSA by PCR: NEGATIVE

## 2010-05-22 LAB — PLATELET COUNT: Platelets: 207 10*3/uL (ref 150–400)

## 2010-05-22 LAB — MAGNESIUM
Magnesium: 2.4 mg/dL (ref 1.5–2.5)
Magnesium: 3 mg/dL — ABNORMAL HIGH (ref 1.5–2.5)
Magnesium: 3.2 mg/dL — ABNORMAL HIGH (ref 1.5–2.5)

## 2010-05-22 LAB — BRAIN NATRIURETIC PEPTIDE: Pro B Natriuretic peptide (BNP): 525 pg/mL — ABNORMAL HIGH (ref 0.0–100.0)

## 2010-05-22 LAB — ABO/RH: ABO/RH(D): A POS

## 2010-05-22 LAB — HEMOGLOBIN AND HEMATOCRIT, BLOOD
HCT: 28.5 % — ABNORMAL LOW (ref 39.0–52.0)
Hemoglobin: 10.1 g/dL — ABNORMAL LOW (ref 13.0–17.0)

## 2010-05-22 LAB — D-DIMER, QUANTITATIVE: D-Dimer, Quant: 0.24 ug/mL-FEU (ref 0.00–0.48)

## 2010-05-25 LAB — T-HELPER CELL (CD4) - (RCID CLINIC ONLY)
CD4 % Helper T Cell: 41 % (ref 33–55)
CD4 T Cell Abs: 700 uL (ref 400–2700)

## 2010-05-27 LAB — T-HELPER CELL (CD4) - (RCID CLINIC ONLY)
CD4 % Helper T Cell: 37 % (ref 33–55)
CD4 T Cell Abs: 800 uL (ref 400–2700)

## 2010-05-28 NOTE — Medication Information (Signed)
Summary: Tax adviser   Imported By: Florinda Marker 05/22/2010 16:14:30  _____________________________________________________________________  External Attachment:    Type:   Image     Comment:   External Document

## 2010-05-28 NOTE — Progress Notes (Signed)
Summary: Pt. applying for [employer] Disability   Phone Note Call from Patient   Caller: Patient Summary of Call: states he is trying for disability. states he has no energy & can no longer work. hx of cardiac bypasses. seeing a disabilty md this week. wants Dr. Maurice March to write him a letter for the disabilty people confirming his medical situation & affirming he needs disability. he has no insurance. told him the other md or disabilty people will send forms here. we can fill them out & add the letter. to Dr. Maurice March Initial call taken by: Golden Circle RN,  May 21, 2010 4:36 PM

## 2010-06-14 LAB — T-HELPER CELL (CD4) - (RCID CLINIC ONLY)
CD4 % Helper T Cell: 44 % (ref 33–55)
CD4 T Cell Abs: 910 uL (ref 400–2700)

## 2010-06-17 ENCOUNTER — Other Ambulatory Visit: Payer: Self-pay | Admitting: Infectious Diseases

## 2010-06-17 DIAGNOSIS — R52 Pain, unspecified: Secondary | ICD-10-CM

## 2010-06-17 LAB — T-HELPER CELL (CD4) - (RCID CLINIC ONLY)
CD4 % Helper T Cell: 41 % (ref 33–55)
CD4 T Cell Abs: 870 uL (ref 400–2700)

## 2010-06-17 MED ORDER — HYDROCODONE-ACETAMINOPHEN 5-500 MG PO TABS: 1.0000 | ORAL_TABLET | Freq: Four times a day (QID) | ORAL | Status: DC | PRN

## 2010-06-27 ENCOUNTER — Telehealth: Payer: Self-pay | Admitting: *Deleted

## 2010-06-27 ENCOUNTER — Encounter: Payer: Self-pay | Admitting: *Deleted

## 2010-06-27 NOTE — Telephone Encounter (Signed)
He had called earlier today needing copies of his visits & a letter for his disability attempt. I LM for him to stop by & sign ROI as the records & letter are ready. Placed up front

## 2010-07-04 DIAGNOSIS — Z0271 Encounter for disability determination: Secondary | ICD-10-CM

## 2010-07-05 ENCOUNTER — Encounter: Payer: Self-pay | Admitting: Cardiovascular Disease

## 2010-07-09 ENCOUNTER — Encounter: Payer: Self-pay | Admitting: Cardiovascular Disease

## 2010-07-09 ENCOUNTER — Ambulatory Visit (INDEPENDENT_AMBULATORY_CARE_PROVIDER_SITE_OTHER): Payer: BC Managed Care – PPO | Admitting: Cardiovascular Disease

## 2010-07-09 DIAGNOSIS — Z951 Presence of aortocoronary bypass graft: Secondary | ICD-10-CM

## 2010-07-09 DIAGNOSIS — E7800 Pure hypercholesterolemia, unspecified: Secondary | ICD-10-CM

## 2010-07-09 DIAGNOSIS — I10 Essential (primary) hypertension: Secondary | ICD-10-CM

## 2010-07-09 DIAGNOSIS — B20 Human immunodeficiency virus [HIV] disease: Secondary | ICD-10-CM

## 2010-07-09 NOTE — Patient Instructions (Signed)
Your physician recommends that you schedule a follow-up appointment in:  3 MONTHS WITH  DR NISHAN  Your physician recommends that you continue on your current medications as directed. Please refer to the Current Medication list given to you today.  

## 2010-07-09 NOTE — Progress Notes (Signed)
[name] Smith is seen today for F/U of dyspnea, CAD S/P CABG x2  With redo 10/11.  EF has been mildly reduced in the 45% range.  No clinical history of CHF.  No arryhthmia and no indication for AICD.  Still smoking.  Clinical COPD.  Recent virus and still wheezing.  Did not want to spend money on inhaler.  Called pulmonary to get him a Xopenex sample.  No SSCP, dyspnea from smoking.  NO palpitatons or edema.  Lots of stress regarding work and possible disability application.  HIV stable and F/U with Dr Maurice March.  LDL still 144 on high dose zocor which interacts with atripla and his antiretroviral Rx the least  ROS: Denies fever, malais, weight loss, blurry vision, decreased visual acuity, cough, sputum, SOB, hemoptysis, pleuritic pain, palpitaitons, heartburn, abdominal pain, melena, lower extremity edema, claudication, or rash.   General: Affect appropriate Healthy:  appears stated age HEENT: normal Neck supple with no adenopathy JVP normal no bruits no thyromegaly Lungs mid and end expitory wheezing and good diaphragmatic motion Heart:  S1/S2 no murmur,rub, gallop or click PMI normal Abdomen: benighn, BS positve, no tenderness, no AAA no bruit.  No HSM or HJR Distal pulses intact with no bruits No edema Neuro non-focal Skin warm and dry No muscular weakness   Current Outpatient Prescriptions  Medication Sig Dispense Refill  . ALPRAZolam (XANAX) 0.25 MG tablet Take 0.25 mg by mouth at bedtime as needed.        Marland Kitchen aspirin 325 MG EC tablet Take 325 mg by mouth daily.        . clopidogrel (PLAVIX) 75 MG tablet Take 75 mg by mouth daily.        Marland Kitchen efavirenz-emtrictabine-tenofovir (ATRIPLA) 600-200-300 MG per tablet Take 1 tablet by mouth daily.        Marland Kitchen FLUoxetine (PROZAC) 10 MG capsule Take 10 mg by mouth daily.        Marland Kitchen FOLIC ACID PO Take 1 tablet by mouth daily.        Marland Kitchen HYDROcodone-acetaminophen (VICODIN) 5-500 MG per tablet Take 1 tablet by mouth every 6 (six) hours as needed.  120 tablet  2  .  metoprolol (TOPROL-XL) 50 MG 24 hr tablet Take 50 mg by mouth daily.        . nicotine (NICODERM CQ - DOSED IN MG/24 HOURS) 21 mg/24hr patch Place 1 patch onto the skin as directed.        . nitroGLYCERIN (NITROSTAT) 0.4 MG SL tablet Place 0.4 mg under the tongue every 5 (five) minutes as needed. For chest pain -- may repeat times three       . simvastatin (ZOCOR) 80 MG tablet Take 80 mg by mouth at bedtime.        . varenicline (CHANTIX PAK) 0.5 MG X 11 & 1 MG X 42 tablet Take by mouth as directed. Take one 0.5mg  tablet by mouth once daily for 3 days, then increase to one 0.5mg  tablet twice daily for 3 days, then increase to one 1mg  tablet twice daily.       Marland Kitchen DISCONTD: albuterol (PROVENTIL HFA) 108 (90 BASE) MCG/ACT inhaler Inhale into the lungs as needed. 1 - 2 puffs as needed wheezing       . DISCONTD: clarithromycin (BIAXIN) 250 MG tablet Take 250 mg by mouth 2 (two) times daily. X 7 days       . DISCONTD: varenicline (CHANTIX) 1 MG tablet Take 1 mg by mouth as directed.  Allergies  Review of patient's allergies indicates no known allergies.  Electrocardiogram:  Assessment and Plan

## 2010-07-09 NOTE — Assessment & Plan Note (Signed)
Consider changing to Vytorin if LDL stays over 140.  LFT;s normal.  Zocor least interaction with antiretrovirals

## 2010-07-09 NOTE — Assessment & Plan Note (Signed)
Stable no angina or active CHF.  Long discussion with [name] regarding disability.  His cardiac history alone although extensive would not necessarily in my mind qualify him for disability. However the combination of COPD/Emphysema, long standing HIV and CAD with two previous CABG;s may especially compared to most of the patients I have seen on disability he clearly has in sum more chronic health problems.

## 2010-07-09 NOTE — Assessment & Plan Note (Signed)
Well controlled.  Continue current medications and low sodium Dash type diet.    

## 2010-07-09 NOTE — Assessment & Plan Note (Signed)
F/U Dr Maurice March CD 4 counts ok.  No opportunistic infections

## 2010-07-23 NOTE — Assessment & Plan Note (Signed)
[facility]                            CARDIOLOGY OFFICE NOTE   NAME:[name], [name]                     MRN:          [id number]  DATE:01/18/2007                            DOB:          May 18, 1960    [name] returns today for followup.  He is status post coronary artery  bypass grafting surgery.  His coronary risk factors include  hypertension, hypercholesterolemia.  He is HIV positive and followed by  Dr. Darlina Sicilian.  The patient has not had any significant chest pain.  Unfortunately, he continues to smoke.  He says he will try a quit date  in January.  He has already gotten a prescription for Chantix.   REVIEW OF SYSTEMS:  Remarkable for a question of recent depression.  He  was started on Prozac by Dr. Maurice March.  The patient also fell on Halloween  and had significant ecchymosis in his right hip.  His insurance company  has been giving him a little bit of grief regarding stress Myoview  testing.  I told him, since he has a normal EKG, we will have him come  back when his hip is improved for regular treadmill test.  Review of  systems is otherwise negative.   MEDICATIONS:  1. Atenolol 50 a day.  2. Plavix 75 a day.  3. Lescol 80 a day.  4. Atripla 1600 mg nightly.  5. An aspirin a day.  6. Nasacort.  7. Prozac 20.   EXAM:  Remarkable for a blood pressure 130/80, pulse 80 and regular,  respiratory rate 14.  Afebrile.  HEENT:  Unremarkable.  Carotids are normal without bruit.  No lymphadenopathy, thyromegaly, or  JVP elevation.  LUNGS:  Clear with good diaphragmatic motion.  No wheezing.  S1, S2 without murmur, rub, or gallop, or click.  ABDOMEN:  Benign.  Bowel sounds positive.  No AAA.  No  hepatosplenomegaly.  No hepatojugular reflux.  Distal pulses intact.  No edema.  NEUROLOGIC:  Nonfocal.  He has a small hematoma outside the right hip.  There is no evidence of a broken hip.  Leg length is equal.  He has good  ambulation.    IMPRESSION:  1. Coronary artery disease with previous CABG.  Continue aspirin and      beta blocker therapy.  Follow up stress test in about a month.  2. Hypercholesterolemia.  Continue Lescol.  Lipid and liver profile in      6 months.  3. Human immunodeficiency virus positive.  Continue Atripla.  Followup      with Dr. Darlina Sicilian.  No acquired immune deficiency syndrome-defining      diagnoses yet.  4. Significant reflux, probably secondary to alcohol intake and      smoking.  Continue over-the-counter Prilosec.  5. Question depression.  Continue Prozac 20 mg a day.  6. Smoking.  Counseled for less than 10 minutes regarding cessation.      He has Chantix, quit date set for first of the year.  I am somewhat      dubious as to whether he will actually stop.  I will  see him back      for his treadmill test in a month.     [name], MD, [facility]  Electronically Signed    PCN/MedQ  DD: 01/18/2007  DT: 01/18/2007  Job #: [id number]

## 2010-07-23 NOTE — Assessment & Plan Note (Signed)
OFFICE VISIT   [name] Smith, [name] Smith  DOB:  1961/01/23                                        January 23, 2010  CHART #:  [id number]   CURRENT PROBLEMS:  1. Status post redo CABG x4 December 28, 2009, for recurrent coronary      artery disease with unstable angina and three-vessel coronary      artery disease.  2. Reformed smoker.  3. History of dyslipidemia.  4. History of HIV.   PRESENT ILLNESS:  The patient is a 50 year old Caucasian male ex-smoker  who presents for his first postop office visit after undergoing redo  CABG x4 approximately 1 month ago.  He had his original surgical  revascularization in 2011, and return with unstable angina with a patent  left IMA graft to the mid LAD, but with distal LAD disease.  He had a  patent vein graft to the ramus intermediate with significant vein graft  disease and a previously nongrafted right coronary with significant  proximal and distal stenosis to a dominant distal vessel.  Vein grafts  to the diagonal and circumflex were closed.  He underwent a new vein  graft to the distal LAD as well as grafts to the posterior descending,  posterior lower branch of right coronary into the ramus intermediate.  He did well following surgery remaining in sinus rhythm but did have  some postoperative hypoxemia due to his smoking history and was  discharged home on home oxygen.  He only uses home oxygen for about a  week.  He has been successful and not smoking.  He denies any recurrent  angina.  The surgical incisions are healing well.  He has been somewhat  depressed, but he is walking every day in getting stronger.  He  complains of poor appetite, insomnia and depression.   HOME MEDICATIONS:  Hydrocodone for pain, folic acid, Toprol-XL 50 mg a  day, aspirin 325 mg a day, Plavix 75 mg a day, Prozac 20 mg a day,  simvastatin 80 mg a day, and his HIV medication called Atripla.   PHYSICAL EXAMINATION:  Vital Signs:  Blood  pressure 125/80, pulse 80 and  regular, respirations 18, saturation on room air 97%.  General:  He is  alert and pleasant.  He looks very well.  Breath sounds are clear and  equal.  The sternum is stable.  The sternal incision is well healed.  CARDIAC:  Rhythm is regular without murmur, rub or gallop.  The left leg  incision is healed.  There is minimal nonpitting edema of the left  ankle.  Pain was harvested endoscopically from the left leg.   PA and lateral chest x-ray shows mild baseline cardiomegaly with clear  lung fields.  No pleural effusion.  The sternal wires were all intact.   IMPRESSION AND PLAN:  The patient is doing well 1 month after surgery.  He will start his outpatient rehab next week.  I told the patient he  could resume driving and normal daily activities lifting up to 20 pounds  maximum for the next 2 months.  He was again encouraged to remain  smoking free and he will return here as needed.   [name], M.D.  Electronically Signed   PV/MEDQ  D:  01/23/2010  T:  01/24/2010  Job:  [id number]  cc:   [name], MD, [facility]  Cam Hai, C.N.M.

## 2010-07-23 NOTE — Assessment & Plan Note (Signed)
[facility]                            CARDIOLOGY OFFICE NOTE   NAME:RUMLEYJaquann, [name] Smith                     MRN:          [id number]  DATE:11/02/2007                            DOB:          April 01, 1960    [name] returns today for followup.  He has a history of coronary artery  disease with failed stents and requiring bypass surgery.  He continues  to smoke, we discussed this at length.  He is really not motivated to  quit.  He has been tried on Wellbutrin and Chantix.   He is smoking about a pack a day and long-term risks were discussed  again, he understands that they could cause his bypass grafts to fail  prematurely.  The patient clinically did also have some asthmatic  bronchitis.   He is HIV positive for the last 18 years.  His CD-4 counts are fine, in  fact they are improving and are up over 800.   He has been intolerant to multiple cholesterol pills in the past  including WELCHOL and LESCOL.  We will try him on generic Zocor.  He  will start every other day.   From cardiac perspective, he is stable.  He is not having chest pain,  palpitations, PND, or orthopnea.   His review of systems otherwise remarkable for recent fall in his  bathtub with some left shoulder pain, otherwise negative.   He is on atenolol 50 a day, Plavix 75 a day, Lescol 80 a day, Atripla,  Nasacort, aspirin, Prozac.   PHYSICAL EXAMINATION:  VITAL SIGNS:  His weight is 166, blood pressure  130/80, pulse 74, regular, respiratory rate 14, and afebrile.  HEENT:  Unremarkable.  NECK:  Carotids normal without bruit.  No lymphadenopathy, thyromegaly,  or JVP elevation.  LUNGS:  Expiratory wheezes, particularly in the right side.  Good  diaphragmatic motion.  CARDIAC:  S1 and S2.  Normal heart sounds.  PMI normal.  ABDOMEN:  Benign.  Bowel sounds positive.  No AAA.  No tenderness.  No  bruit.  No hepatosplenomegaly.  No hepatojugular reflux.  No tenderness.  EXTREMITIES:   Distal pulses are intact.  No edema.  NEURO:  Nonfocal.  SKIN:  Warm and dry.  No muscular weakness.  He does have some bruising  over the left shoulder from his fall.   IMPRESSION:  1. Coronary artery disease, previous coronary artery bypass graft.      Continue aspirin and beta-blocker.  2. Hypercholesterolemia.  Try Zocor 40 to start every other day to see      if he can tolerate without myalgias.  Followup lipid and liver      profile with Dr. Arvilla Market.  3. Human immunodeficiency virus positive.  Continue to follow up with      Dr. Maurice March.  Antiretroviral seems to be working well.  CD-4 counts      improving.  4. History of anxiety and depression.  Continue Prozac.  5. Smoking cessation.  The patient may try some alternatives such as      hypnosis.  We have tried pharmacological aids and they  have been      unsuccessful.  He will call me and let me know how the Zocor goes,      otherwise I will see him back in a year.      [name], MD, [facility]  Electronically Signed    PCN/MedQ  DD: 11/02/2007  DT: 11/03/2007  Job #: [id number]

## 2010-07-25 ENCOUNTER — Ambulatory Visit: Payer: BC Managed Care – PPO | Admitting: Infectious Diseases

## 2010-07-26 NOTE — Assessment & Plan Note (Signed)
[facility]                            CARDIOLOGY OFFICE NOTE   BERTHOLD, GLACE                     MRN:          [id number]  DATE:06/11/2006                            DOB:          Nov 05, 1960    Mr. [name] Smith is seen today in follow-up.   He has a history of coronary artery disease.   He has had previous PTCA of the right coronary artery and had a  restenosis.   He is status post coronary artery bypass surgery.  Unfortunately he  continues to smoke.  We have tried him on Chantix before but I doubt  that he will every stop.  It is part of his lifestyle.   The patient's last Myoview was October 2007.  It was nonischemic.  No  evidence of scar and an EF of 59%.   REVIEW OF SYSTEMS:  His 10-point review of systems is negative except  for significant reflux.  I explained to him that this is likely due to  his continued nicotine use.  He has been seen by GI in the past.  He has  tried Prevacid and Protonix and nothing seems to work.  He is trying  more herbal medicines at this time.  I encouraged him to follow up with  GI, particularly since he carries a diagnosis of HIV.   In regards to his HIV, his T-cell helper count is greater than 800 and  his other indices seem to be in a good range.  He follows with Dr. Darlina Smith for this.   CURRENT MEDICATIONS:  1. Atenolol 50 a day.  2. Plavix 75 a day.  3. Lescol 80 a day.  4. Atripla 1600 nightly.  5. Nasacort b.i.d.  6. Aspirin a day.   PHYSICAL EXAMINATION:  GENERAL APPEARANCE:  He looks well.  VITAL SIGNS:  Blood pressure 140/80, pulse 80 and regular.  HEENT:  Normal.  NECK:  Carotids normal without bruit.  LUNGS:  Clear.  CARDIOVASCULAR:  He has an S1 and S2 with normal heart sounds.  ABDOMEN:  Benign.  EXTREMITIES:  Lower extremities with intact pulses and no edema.  NEUROLOGIC:  Nonfocal.  SKIN:  Warm and dry.   IMPRESSION:  Stable coronary artery disease status post coronary  artery  bypass grafting and no angina.  Low risk Myoview October of last year.  Continued nicotine abuse.  Follow-up stress Myoview in October.   Continue Lescol for hypercholesterolemia.  The patient will also  continue his aspirin, Plavix and beta-blocker therapy.   I encouraged him to follow up with GI in regards to having a follow-up  endoscopy both to rule out Barrett's esophagus and any other pathology  since he is human immunodeficiency virus positive.  He will continue to  follow up with Dr. Darlina Smith in regards to this.  I will see him in  October when he has a stress Myoview.     [name], MD, [facility]  Electronically Signed    PCN/MedQ  DD: 06/11/2006  DT: 06/11/2006  Job #: [id number]

## 2010-07-26 NOTE — Discharge Summary (Signed)
NAMEERWIN, NISHIYAMA              ACCOUNT NO.:  [id number]   MEDICAL RECORD NO.:  [id number]          PATIENT TYPE:  INP   LOCATION:  2010                         FACILITY:  [facility]   PHYSICIAN:  Charlton Haws, M.D.     DATE OF BIRTH:  04-Dec-1960   DATE OF ADMISSION:  11/23/2004  DATE OF DISCHARGE:  11/25/2004                                 DISCHARGE SUMMARY   SUMMARY OF HISTORY:  Mr. Corbo is a 50 year old white male, after working  on the evening prior to admission, he developed chest tightness associated  with shortness of breath. Discomfort persisted throughout the night and he  presented to the emergency room on the morning of the 16th. He was noted to  have ST segment elevation in V1 and V2 with diffuse ST depression. He did  not have any other associated symptoms.   His past medical history is notable for four-vessel bypass grafting with a  LIMA to the LAD, saphenous vein graft to the ramus, saphenous vein graft to  the diagonal, and saphenous vein graft to the circumflex. He also has a  history of hyperlipidemia, hypertension, nephrolithiasis, tobacco use, and  he is HIV positive.   LABORATORY DATA:  Fasting lipid showed a total cholesterol of 147,  triglycerides 86, HDL 33, and LDL 97.  CK totals were within normal limits,  however MBs were elevated at 23.2, 18.6, and 14.8 with elevated relative  indices of 10.2, 8.9, and 9.5.  Troponins were also elevated at 2.58, 3.85,  and 3.04. Admission sodium was 139, potassium 3.8, BUN 15, creatinine 1.2.  __________ chemistries were unremarkable. LFTs on the 16th did show an AST  slightly elevated at 41.  Admission H&H were 16.0 and 46.3, and normal  indices with platelets of 167,000, WBC of 6.9. Subsequent hematologies were  unremarkable.   Chest x-ray showed cardiomegaly with vascular congestion.  Admission EKG  showed anterior elevation in V1 and V2 with diffuse ST segment depression.  Subsequent EKGs showed resolution of the  previously mentioned EKG changes.   HOSPITAL COURSE:  Mr. Petrosky was taken emergently to the catheterization  laboratory by Dr. Riley Kill. Please refer to dictation. According to Dr.  Rosalyn Charters note his LV function was normal. He has native three-vessel  coronary artery disease. The saphenous vein graft to the diagonal had a 40%  lesion, LIMA to the LAD patent, saphenous vein graft to the ramus had a 50%  proximal lesion, saphenous vein graft to the OM had a 50% proximal lesion.  The RCA is diffusely diseased in the proximal portion, 70%, 80%, 90% lesion.  These were angioplastied by Dr. Riley Kill to 0%. He has distal RCA lesions of  30%, 30%, and 40%. PDA and PLA also have residual lesions. Dr. Riley Kill noted  resolution of EKG changes post intervention. He also recommended Plavix for  four weeks, continue beta blockers, discontinue smoking, and to follow up  with Dr. Maurice March.  Post bedrest and ambulation, catheterization site was  intact. By September 18th he was on telemetry and ambulating in the halls  without difficulty. Dr. Eden Emms felt that he  could be discharged home.   Prior to discharge he can be seen by tobacco cessation consult as well as  cardiac rehab.   DISCHARGE DIAGNOSES:  1.  Non-Q-wave myocardial infarction, status post angioplasty of the      proximal RCA previously described.  2.  Residual right coronary artery disease with normal left ventricular      function, continue medical treatment.  3.  Hyperlipidemia.  4.  Tobacco use.  5.  Human immunodeficiency virus positive.  6.  History of hypertension.   DISPOSITION:  He is discharged home. He is asked to maintain a low salt,  fat, cholesterol diet. He was advised no lifting, drifting, sexual activity,  or heavy exertion for two weeks. If he has any problems with his  catheterization site he is asked to call immediately. He is asked to bring  all medications to all follow-up appointments. New prescriptions include   Wellbutrin 150 mg daily for three days and b.i.d. for seven weeks versus  tobacco cessation, __________ pill 75 mg daily for four weeks, and his  atenolol was increased to 50 mg daily. He was asked to continue his aspirin  325 mg daily, Pravachol 80 mg q.h.s., and nitroglycerin 0.4 p.r.n. He will  follow up with Dr. Eden Emms on September 26th at 10:30. At the time of follow-  up with Dr. Eden Emms review of his lipids should be pursued to consider  changing Pravachol or adding other medication to further lower his LDL and  increase his HDL. Review of cardiac risk factors should be pursued.  He was  asked to call Dr. Maurice March for a follow-up appointment. He was advised no  smoking or tobacco products. During his hospital admission he received a  copy of Dr. Rosalyn Charters progress note in regards to his coronary artery  disease as well as information about tobacco cessation and hyperlipidemia.      Joellyn Rued, P.A. [facility]    ______________________________  Charlton Haws, M.D.    EW/MEDQ  D:  11/25/2004  T:  11/25/2004  Job:  [id number]   cc:   Fransisco Hertz, M.D.  [address]  [place] [address]  Fax: [phone]   Charlton Haws, M.D.  [address]  [address]  [place]  [place] [address]

## 2010-07-26 NOTE — H&P (Signed)
NAMEJONTAE, ADEBAYO              ACCOUNT NO.:  [id number]   MEDICAL RECORD NO.:  [id number]          PATIENT TYPE:  INP   LOCATION:  2923                         FACILITY:  [facility]   PHYSICIAN:  Olga Millers, M.D. [facility] OF BIRTH:  [birth year]   DATE OF ADMISSION:  11/23/2004  DATE OF DISCHARGE:                                HISTORY & PHYSICAL   HISTORY OF PRESENT ILLNESS:  Mr. Hendrickson is a 50 year old patient of Dr.  Theron Arista Nishan's with a past medical history of coronary artery disease,  status post coronary artery bypass graft, hypertension, hyperlipidemia, HIV  positive, nephrolithiasis, and tobacco abuse who presents with acute  myocardial infarction. The patient is status post coronary artery bypass  graft in October, 2001. At that time he had a LIMA to the LAD, saphenous  vein graft to the ramus intermedius, saphenous vein graft to the diagonal,  and a saphenous vein graft to the circumflex. He has done well since then.  After working last evening he developed chest tightness as well as shortness  of breath. The pain persisted through the night and he presented to the  emergency room early this morning. He was found to have ST elevation in V1  through V2 with diffuse ST depression. We are asked to further evaluate. Of  note, he does not have nausea, vomiting, or diaphoresis.   ALLERGIES:  No known drug allergies.   MEDICATIONS:  1.  Atenolol.  2.  Pravachol.  3.  Aspirin.   SOCIAL HISTORY:  He does smoke and does occasionally consume alcohol. There  is no history of cocaine use by his report.   FAMILY HISTORY:  Positive for coronary artery disease.   PAST MEDICAL HISTORY:  Significant for hypertension and hyperlipidemia but  there is no diabetes mellitus.  He has a history of HIV positive. He has a history of nephrolithiasis.   PAST SURGICAL HISTORY:  He has had no other surgeries other than his  coronary artery bypass graft as described in the history of present  illness.   REVIEW OF SYSTEMS:  He denies any headaches, fevers or chills. There is no  productive cough or hemoptysis. No dysphagia, odynophagia, melena or  hematochezia. There is no dysuria or hematuria. There is no rash or seizure  activity. There is no orthopnea, paroxysmal nocturnal dyspnea or pedal  edema. There is no claudication noted. The remainder of systems are  negative.   PHYSICAL EXAMINATION:  VITAL SIGNS:  Blood pressure is 130/80 and pulse 84.  GENERAL:  Well-developed, well-nourished, in no acute distress. He does not  appear to be depressed.  SKIN:  Warm and dry.  HEENT:  Unremarkable with normal eyelids.  NECK:  Supple with normal upstrokes bilaterally. He has no bruits noted.  There is no jugular venous distension and I cannot appreciate thyromegaly.  CHEST:  Clear to auscultation, normal expansion.  CARDIOVASCULAR EXAM:  Regular rate and rhythm with normal S1 and S2. There  are no murmurs, rubs, or gallops noted.  ABDOMEN:  Nontender. Positive bowel sounds. No hepatosplenomegaly. No mass  appreciated. There is no abdominal bruit. He  has 2+ femoral pulses  bilaterally and no bruits.  EXTREMITIES:  Show no edema and I could palpate no cords. He has 2+ dorsalis  pedis pulses bilaterally.  NEUROLOGIC:  Exam is grossly intact.   LABORATORY DATA:  Hemoglobin is 16.7, hematocrit is 49. His platelet count  is pending. His potassium is 3.8. BUN and creatinine are 15 and 1.2.   His electrocardiogram shows normal sinus rhythm at a rate of 84. The axis is  normal. There is ST elevation in V1 and V2 with ST depression diffusely.   DISCHARGE DIAGNOSES:  1.  Acute myocardial infarction.  2.  Coronary artery disease status post coronary artery bypass graft.  3.  Hypertension.  4.  Hyperlipidemia.  5.  Human immunodeficiency virus positive.  6.  Nephrolithiasis.   PLAN:  Mr. Devereux presents with an acute ST elevation myocardial infarction.  We will treat with aspirin,  heparin, nitroglycerin, beta blockade as well as  a Statin. He will also need an ACE inhibitor. The risk and benefits of  cardiac catheterization have been discussed with the patient and he agreed  to proceed. Will need to discontinue his tobacco use. Of note, he is having  a microscopic hematuria that is being evaluated.           ______________________________  Olga Millers, M.D. [facility]     BC/MEDQ  D:  11/23/2004  T:  11/23/2004  Job:  [id number]   cc:   Charlton Haws, M.D.  [address]  [address]  [place]  [place] [address]

## 2010-07-26 NOTE — Discharge Summary (Signed)
[place]  Patient:    [name] Smith                     MRN: [id number] Adm. Date:  [id number] Disc. Date: 01/06/00 Attending:  Mikey Bussing Dictator:   Maxwell Marion, RNFA CC:         Desma Maxim, M.D.  [name], M.D. [facility]   Discharge Summary  DATE OF BIRTH:  Jul 19, 1960  ADMISSION DIAGNOSIS:  Acute chest pain, electrocardiograms consistent with ischemia.  PAST MEDICAL HISTORY: 1. HIV positive, followed in infectious disease clinic, no medications at the    present time. 2. Tobacco use.  ALLERGIES:  No known drug allergies.  DISCHARGE DIAGNOSES: 1. Coronary artery disease status post coronary artery bypass grafting. 2. Postoperative volume overload, resolving.  HOSPITAL COURSE AND PROCEDURES:  On December 30, 1999, Mr. [name] Smith, who is a 50 year old white man, was admitted to [facility] Emergency Room with complaints of crushing substernal chest pain.  EKG taken at that time was suggestive of anterolateral MI.  He was transferred to [facility] for urgent cardiac catheterization by Dr. Eden Emms which revealed two-vessel coronary artery disease.  Cardiac surgery consult was obtained with Dr. Donata Clay who recommended coronary artery bypass grafting after 72 hours stability following his MI.  On October 24, preoperative Doppler studies revealed no significant carotid artery disease, and he was noted to have bilateral palpable pedal pulses.  On October 25, Mr. [name] Smith underwent uncomplicated coronary artery bypass grafting x 4 by Dr. Donata Clay.  Graft placement at time procedure included left internal mammary artery to the LAD, saphenous vein graft to ramus, saphenous vein graft to diagonal, saphenous vein graft to the circumflex.  At the conclusion of the procedure, the patient was transferred in stable condition to the SICU.  Mr. [name] Smith postoperative course has been uneventful.  He has made  very good progress, and it is anticipated that he can be discharged tomorrow morning, January 06, 2000.  DISCHARGE MEDICATIONS: 1. Enteric-coated aspirin 325 mg q.d. 2. Darvocet-N 100 one to two p.o. q.4-6h. p.r.n. for pain. 3. Atenolol 12.5 mg q.12h. 4. Lasix 40 mg q.d. x 5 days. 5. Kay-Ciel 20 mEq q.d. x 5 days.  FOLLOWUP:  Mr. Eggenberger has been instructed to contact Dr. Ricki Miller office and arrange an appointment in two weeks and have a chest x-ray taken at that time. Dr. Vincent Gros office will be calling with an appointment to see him in three weeks. DD:  01/05/00 TD:  01/05/00 Job: [id number]

## 2010-07-26 NOTE — Consult Note (Signed)
[place]. [facility]  Patient:    [name] Smith, [name] Smith Visit Number: [id number] MRN: [id number]          Service Type: EMS Location: [place] Attending Physician:  Nelia Shi Dictated by:   Rollene Rotunda, M.D. [facility] Proc. Date: 05/07/01 Admit Date:  05/07/2001   CC:         Desma Maxim, M.D.  [name], M.D. [facility]   Consultation Report  PRIMARY CARE PHYSICIAN:  Desma Maxim, M.D.  CARDIOLOGIST:  [name], M.D.  REASON FOR PRESENTATION:  Evaluate the patient with chest pain.  HISTORY OF PRESENT ILLNESS:  The patient is a 50 year old gentleman with a history of coronary disease status post CABG. He presents for evaluation of chest discomfort that has been occurring for two weeks. It is a fleeting and burning discomfort. It occurs at rest. It is not reproducible with exertion. The last episode was yesterday. It only lasts for a few seconds. There is no nausea, vomiting, or diaphoresis. There is no radiation to his neck or into his arms. He does not think this is like his previous angina. He came to the emergency to "get checked out."  PAST MEDICAL HISTORY:  Myocardial infarction December 31, 1999 with diffuse three-vessel coronary disease, CABG (LIMA to the LAD, SVG to ramus intermediate, SVG to diagonal, SVG to circumflex), HIV-positive, ongoing tobacco use, hyperlipidemia.  ALLERGIES:  None.  MEDICATIONS:  Pravachol, atenolol, aspirin (the patient does not recall the dose).  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 He continues to smoke at least 1/2 pack of cigarettes and has done so for 25 years. He occasional drinks alcohol.  FAMILY HISTORY:  Noncontributory for early coronary artery disease in first-degree relatives.  REVIEW OF SYSTEMS:  As stated in the History of Present Illness and otherwise negative for all of the systems.  PHYSICAL EXAMINATION:  GENERAL:  The patient is in no distress.  VITAL  SIGNS:  Blood pressure 138/76, heart rate 79 and regular. Afebrile.  HEENT:  Eyes unremarkable, pupils equal, round, and react to light. Fundi not visualized. Oral mucosa unremarkable.  NECK:  No jugular venous distention. Wave form within normal limits. Carotid upstrokes present and symmetrical, no bruits, no thyromegaly.  LYMPHATICS:  No cervical, axillary or inguinal adenopathy.  LUNGS:  Clear to auscultation bilaterally.  BACK:  No costovertebral angle tenderness.  CHEST:  Unremarkable.  HEART:  PMI not displaced or sustained, S1 and S2 within normal limits, no S3, no S4, no murmurs.  ABDOMEN:  Flat, positive bowel sounds normal in frequency and pitch, no bruits, no rebound, no guarding, no midline postular mass, no organomegaly.  SKIN:  No rash, no nodules.  EXTREMITIES:  2+ pulses throughout, no edema.  NEUROLOGICAL:   Grossly intact throughout.  DIAGNOSTIC DATA:  EKG sinus rhythm, rate 95, axis within normal limits, intervals within normal limits, no QS and T wave changes.  Chest x-ray no acute disease.  LABORATORY DATA:  Hemoglobin 17, hematocrit 49. BUN 17, potassium 4.2, troponin 0.01, CK 45, MB 2.2.  ASSESSMENT AND PLAN: 1. Chest pain. The patients chest pain is very atypical. There is no active    evidence of ischemia with the negative EKG and no elevated enzymes. He had    not had any discomfort since yesterday. He does have ongoing risk factors    and known disease. At this point I think he is a very low risk patient and  that it is unlikely that he has ischemia as etiology. He does deserve    screening with a stress profusion study. We have discussed this at length.    He understands the risks and benefits of this approach and very much wants    to go home. He will be scheduled for a stress test on Monday and this    appointment has been given to him with instructions. He understands to come    back to the emergency room or take sublingual nitroglycerin  if he has    recurrent chest pain. 2. Tobacco. We had a long discussion in the emergency room (greater than 1/2    hour) regarding the need to stop smoking. 3. Follow-up. He can follow up with Dr. Eden Emms in 2 weeks or as needed after    the profusion study. Dictated by:   Rollene Rotunda, M.D. [facility] Attending Physician:  Nelia Shi DD:  05/07/01 TD:  05/07/01 Job: [id number]

## 2010-07-26 NOTE — Cardiovascular Report (Signed)
[name] Smith, [name] Smith              ACCOUNT NO.:  [id number]   MEDICAL RECORD NO.:  [id number]          PATIENT TYPE:  INP   LOCATION:  2923                         FACILITY:  [facility]   PHYSICIAN:  [name], M.D. [facility] OF BIRTH:  12-13-1960   DATE OF PROCEDURE:  DATE OF DISCHARGE:                              CARDIAC CATHETERIZATION   INDICATIONS:  Mr. Want is a 50 year old gentleman who presents with an  acute ST elevation in V1 and V2. There is reciprocal ST depression. He was  seen by Dr. Jens Som and brought promptly to the catheterization laboratory.  On arrival in the catheterization laboratory, his chest pain was resolved  and an EKG done shortly thereafter revealed resolution of ST-segment  elevation. Importantly, the findings were in S and V1 and V2. Prompt  catheterization was recommended.   PROCEDURE:  1.  Left heart catheterization.  2.  Selective coronary arteriography.  3.  Selective left ventriculography.  4.  Saphenous vein graft angiography x 3.  5.  Selective left internal mammary angiography.  6.  PTCA of the right coronary artery with overlapping Mini Vision non-DES      stents.   DESCRIPTION OF PROCEDURE:  The patient was brought to the catheterization  laboratory, prepped and draped in usual fashion. His pain was resolved and  an EKG revealed resolution of ST-segment elevation.  Through an anterior  puncture, the right femoral artery was entered and a 6-French sheath was  placed. Heparin had been previously administered.  With this, views of the  left and right coronaries were obtained in multiple angiographic  projections. Vein graft angiography was performed.  Internal mammary  angiography was performed as well. Following this, central aortic and left  ventricular pressures were measured with pigtail. Ventriculography was  performed in the RAO projection. Dr. Jens Som and I then reviewed the films.  I elected to recommend percutaneous  intervention of the right coronary  artery.  The exact etiology of the patient's EKG changes were uncertain.  However, the right is very large area.  All of the vein grafts and internal  mammary are widely patent. It is possible that the EKG changes could have  come either from internal mammary artery spasm and/or the possibility of a  problem with the two small diagonals that come off as a part of the proximal  LAD complex before bypass location.  Nonetheless, these were not amenable to  percutaneous intervention.  The other possibility would be that it could be  related to the right coronary artery as V1 is greater than V2, but there was  ST depression in the inferior leads.  As it was felt that percutaneous  intervention of the right coronary would be warranted, we elected to  proceed. Moreover, we elected to use non-DES platforms.  The patient has had  recent hematuria  and this been followed by Dr. Donia Guiles for this.  It  has been mostly microscopic although he has noted a little bit of red urine.   I discussed this with the patient also with his family.  His best friend  [name] also  works for many years in the catheterization laboratory and  she was involved as well. Following this, preparations were made for  percutaneous intervention.  Integrilin was given according to protocol.  Oral clopidogrel, 225 milligrams was administered with a plan of additional  75 milligrams.  The right was engaged with a JR-4 6-French guiding catheter  with sideholes, a Prowater Asahi wire was then placed down into the distal  right coronary artery. Predilatation was done with a 2.25 x 20 mm [employer] balloon. The area was then stented using a 2.5 x 28 Mini Vision  stent. A second stent was placed proximally to cover the entire area. Post  dilatation was then done first with a Voyager because we could not get a  PowerSail down, and then subsequently with a PowerSail.  The PowerSail  was  inflated throughout the course of the stent up to 12 atmospheres. There was  an excellent angiographic appearance to the artery.  All catheters were  subsequently removed and the femoral sheath sewn into place. He was taken to  the holding area in satisfactory clinical condition. ACTs were checked  throughout the course of procedure and adjusted appropriately.   HEMODYNAMIC DATA:  1.  Central aortic pressure 140/91, mean 113.  2.  Left ventricular pressure 138/19.   ANGIOGRAPHIC DATA:  1.  Ventriculography was done in the RAO projection. Overall systolic      function was preserved. No definite wall motion abnormalities were seen.      On the previous study in 2001, there was evidence of an anterior wall      motion abnormality.  2.  The left main coronary artery tapers distally with about 30% narrowing.  3.  The LAD proximally is diffusely diseased providing a septal perforator.      The LAD is severely diseased and totally occluded and there are two      small diagonal and/or septal vessels that come off proximally. These are      modest in size and potentially and are severely diseased and      theoretically could be the source of the EKG changes.  4.  There is a ramus intermedius or first marginal branch that is totally      occluded after a small subbranch.  5.  The circumflex marginal was severely diseased with two areas of 80%      narrowing into a small branch. The back branch which is a marginal,      which was bypassed, is occluded.  6.  The right coronary artery is a severely diseased vessel.  There is 70%      segmental narrowing proximally followed by another 70% and then an 80-      90% area of focal stenosis.  Beyond this, the vessel has a beaded      appearance with multiple 30% areas of irregularity.  It bifurcates      distally with about 60% narrowing involving the acute marginal branch     and a 40-50% narrowing beyond the takeoff the acute marginal with a       small ulcerated area just prior to the bifurcation.  The PDA and      posterolateral system was without critical narrowing.  7.  The saphenous vein graft to the distal marginal branch is intact. There      is some segmental plaquing of about 50% in the proximal portion of the      vein graft.  8.  The saphenous vein graft to the intermediate vessel is also intact with      a long area of 50% narrowing in the proximal portion of the graft and      then this is followed by an area of ectasia.  9.  The vein graft to the diagonal is intact. There is an eccentric 40%      narrowing in the proximal portion of the graft.  10. The internal mammary down to the LAD appears to be widely patent and      certainly tapers distally as the LAD itself is somewhat diffusely      diseased.   Following percutaneous intervention, the whole proximal portion of the right  coronary artery was covered with overlapping non-DES stents.  The total  stent length was 43 mm utilizing the two stents and there was at least about  3-4 mm of overlap in the stented areas.  The final angiographic result was  really excellent with no significant residual stenosis in an otherwise  diffusely-diseased vessel.   CONCLUSIONS:  1.  Well-preserved left ventricular function.  2.  Acute coronary syndrome, question mechanism, question related to small      proximal diagonal branches and/or spasm in the internal mammary.  3.  Successful percutaneous intervention of the large dominant right      coronary artery.   DISPOSITION:  The patient will need to quit smoking.  Close follow-up is  warranted.  He will need to be on aspirin at least for four weeks but this  will need to be watched closely because of the patient's hematuria.  Long-  term follow-up will be with Dr. Arvilla Market and Dr. Maurice March as well as Dr. Eden Emms.      [name], M.D. [facility]  Electronically Signed     TDS/MEDQ  D:  11/23/2004  T:  11/24/2004  Job:   [id number]   cc:   Charlton Haws, M.D.  [address]  [address]  [place]  [place] [address]   [facility]   [name], M.D.  [address]  [place]  [place] [address]   Donia Guiles, M.D.  Fax: [phone]

## 2010-07-26 NOTE — Discharge Summary (Signed)
[place]  Patient:    [name] Smith, [name] Smith                     MRN: [id number] Adm. Date:  [id number] Disc. Date: [id number] Attending:  Mikey Bussing Dictator:   Maxwell Marion, RNFA CC:         Mikey Bussing, M.D.  [name], M.D. [facility]  Desma Maxim, M.D.   Discharge Summary  ADDENDUM:  As noted in the original discharge summary [name] was planning on going home on January 06, 2000, but his discharge was delayed to infiltrates seen on his chest x-ray.  On January 06, 2000, pulmonary consult was obtained with Dr. Shelle Iron.  After review of his records and examination of the patient Dr. Shelle Iron decided that since his condition is generally good, he has had no further fever, and no significant pulmonary symptoms and discharge would be okay with him and he can he follow up as an outpatient with Dr. Maurice March.  [name] remained medically stable overnight and was discharged home on January 07, 2000. DD:  01/29/00 TD:  01/31/00 Job: [id number]

## 2010-07-26 NOTE — H&P (Signed)
[name], [name]              ACCOUNT NO.:  [id number]   MEDICAL RECORD NO.:  [id number]          PATIENT TYPE:  INP   LOCATION:  1824                         FACILITY:  [facility]   PHYSICIAN:  Willa Rough, M.D.     DATE OF BIRTH:  May 30, 1960   DATE OF ADMISSION:  02/18/2005  DATE OF DISCHARGE:                                HISTORY & PHYSICAL   HISTORY OF THE PRESENT ILLNESS:  [name] Smith is 50years old and he has  significant coronary artery disease.  He has had a remote anterolateral MI.  He underwent CABG in October 2001.  More recently he underwent cardiac  catheterization in September 2006.  At that time it is my understanding that  he had some ST elevation with chest pain.  There was a question of spasm and  there was a non-Q wave MI.  The patient received a stent to the native right  coronary artery with mini vision non DES stent. He has seen Dr. Eden Emms in  follow up.  He does continue to smoke.  The patient is also HIV positive. He  has some medicines from Dr. Darlina Sicilian, but cannot tell me what his medicine  are at this time.   In the past few days the patient has had some chest discomfort.  It is not  necessarily the same as the pain he has had with his acute coronary events,  but it is concerning.  He also has had some belching.  He came to the  emergency room tonight and was assessed;  and, I am seeing him for  admission.   ALLERGIES:  No known drug allergies.   MEDICATIONS:  1.  Pravachol 80 mg.  2.  Atenolol unknown dose .  3.  Aspirin 81 mg.  4.  Plavix 75 mg.  5.  A medication for HIV; name unknown.   PAST MEDICAL HISTORY:  For other medical problems please see the complete  list below.   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   FAMILY HISTORY:  The family history is positive for coronary disease.   REVIEW OF SYSTEMS:  As mentioned he has been having some belching.  He is  not having any diarrhea.  He is not having any GU  symptoms.  There are no  major musculoskeletal problems.  Otherwise his review of systems is  negative.   PHYSICAL EXAMINATION:  GENERAL:  In the emergency room he is stable.  VITAL SIGNS:  Blood pressure is 140/90, pulse is 85,  respirations are 20.  Temperature is 97.5.  GENERAL APPEARANCE:  The patient is oriented to person, time and place.  Affect is normal.  HEENT:  The head, eyes, ears, nose and throat reveals no xanthelasma.  There  are normal extraocular motions.  NECK:  There are no carotid bruits.  There is no jugular venous distention.  HEART:  Cardiac exam reveals an S1 with an S2.  CHEST:  The patient has an old sternotomy scar.  ABDOMEN:  The abdomen is soft.  There are no masses or bruits.  EXTREMITIES:  There is no peripheral edema.  MUSCULOSKELETAL:  There are no musculoskeletal deformities.   LABORATORY DATA:  The EKG reveals no significant abnormalities.  Hemoglobin  is 16 with a BUN of 19 and a creatinine of 1.0. Point of care troponins are  less than 0.05 on two occasions.  Chest x-ray data is not available at this  time.   PROBLEM LIST:  Problems include:  1.  History of coronary artery disease with a myocardial infarction in then      coronary artery bypass graft in October 2001 with a left internal      mammary artery to the left anterior descending, vein graft to the      circumflex, and a vein graft to the ramus.  I am not sure if there is a      fourth vein graft to the diagonal or not.  2.  Status post myocardial infarction that ended up being a non-Q wave      myocardial infarction in September 2006.  The patient received a stent      to the native right with mini Vision non-drug-eluting stent.  3.  Smoking history.  4.  History of human immunodeficiency virus.  5.  Hypertension.  6.  Hyperlipidemia.  7.  History of nephrolithiasis.  8.  History of good left ventricular function at the time of catheterization      in September 2006.  9.  Current  chest discomfort and belching.  No electrocardiographic change      and troponins are negative.   The exact etiology to of day's symptoms are not clear.  Dr. Eden Emms knows the  patient much better than I.  I know that Dr. Eden Emms has been talking with  the patient recently about the possibility of repeat catheterization.   I will keep the patient NPO this morning and ask Dr. Eden Emms to see the  patient as the day goes on today.Marland Kitchen  He is admitted to telemetry.           ______________________________  Willa Rough, M.D.     JK/MEDQ  D:  02/19/2005  T:  02/19/2005  Job:  [id number]   cc:   Donia Guiles, M.D.  Fax: [phone]   Fransisco Hertz, M.D.  Fax: [phone]   Charlton Haws, M.D.  [phone]  [address]  [place]  [place] [address]

## 2010-07-26 NOTE — H&P (Signed)
[name] ADMISSION   NAME:[name] Smith, [name] Smith                     MRN:          [id number]  DATE:01/06/2006                            DOB:          Apr 10, 1960    HISTORY:  [name] Smith returns today for followup.  He saw Dr. Jonelle Smith  as an add-on recently for chest pain.  I suspect it was secondary to his  Pravachol.  There was a GI overtone with reflux.  He was started on p.r.n.  Prevacid.  He had a Myoview study done on December 09, 2005, which was non-  ischemic.  Oral seems to be improved.   REVIEW OF SYSTEMS:  Remarkable for a URI.  He was asking for a Z-Pak.  He  has not had a fever.  He is HIV positive with CD4 counts in the [phone]  range.  This has been stable.  He has not had an AIDS-defined diagnosis, as  far as I know.  He sees Dr. Fransisco Smith for this.   He has noticed some easy bruising and I told him that as he is on aspirin  and Plavix, we will check a CBC and platelet count today.   From a cardiac perspective, he has not had any recurrent chest pain.  I told  him that given his risk factors and previous cholesterol profile, he should  continue a statin drug.  Apparently Dr. Diona Smith gave him a prescription for  Lescol, but this is fairly expensive.  The patient can go to [facility] and get  Zocor 40 mg for four to 10 dollars.  I told him we would try this.   PHYSICAL EXAMINATION:  VITAL SIGNS:  He is afebrile.  HEENT:  Normal.  NECK:  There is no thyromegaly.  There is no lymphadenopathy.  SKIN:  There are no rashes.  NEUROLOGIC:  Nonfocal.  HEART:  There is an S1 and S2.  Normal heart sounds.  LUNGS:  Clear.  There is no rhonchi or wheezing.  ABDOMEN:  Benign.  LOWER EXTREMITIES:  Intact pulses.  No edema.  I did not see any purpura or  petechiae.   MEDICATIONS:  Are listed in the chart.   IMPRESSION:  Stable, status post-coronary artery bypass graft surgery with  non-ischemic Myoview study.  He had a stent to the mid-right coronary artery  in September 2006, and is on aspirin and Plavix.   PLAN:  1. He continues to smoke.  We have tried him on both Wellbutrin and      Chantix, and he has not been compliant with him.  We talked a bit about      this, and unfortunately when [name] Smith gets dressed, he likes to smoke.  2. His chest pain seems to have resolved off of Pravachol.  He will either      continue Lescol or try to get Zocor at a cheaper price at [facility]   1. Since he has some easy bruising, we will check a CBC and platelet count      today.  2.  I will see him back in six months.  3. He will follow up with Dr. Maurice Smith regarding his HIV infection.    ______________________________  [name] Pick Eden Emms, MD, [facility]    PCN/MedQ  DD: 01/06/2006  DT: 01/06/2006  Job #: [id number]

## 2010-07-26 NOTE — Discharge Summary (Signed)
[name] Smith, [name] Smith              ACCOUNT NO.:  [id number]   MEDICAL RECORD NO.:  [id number]          PATIENT TYPE:  EMS   LOCATION:  MAJO                         FACILITY:  [facility]   PHYSICIAN:  Joellyn Rued, P.A. [facility] DATE OF BIRTH:  04-11-1960   DATE OF ADMISSION:  02/18/2005  DATE OF DISCHARGE:  02/19/2005                           DISCHARGE SUMMARY - REFERRING   DISCHARGE DIAGNOSES:  1.  Noncardiac chest discomfort.  2.  Slight progression of coronary artery disease from bypass surgery,      however, remains unchanged from cardiac catheterization September 2006.  3.  History as listed below.   PROCEDURE PERFORMED:  Cardiac catheterization February 19, 2005, by Dr.  Eden Emms.   SUMMARY OF HISTORY:  [name] Smith is a 50 year old white male well-known to  our practice.  He presented to the emergency room with vague chest  discomfort associated with belching with his known coronary artery disease.  He was admitted for further evaluation.   PAST MEDICAL HISTORY:  1.  Bypass surgery with stenting to the native RCA with a mini-version of      non-DES.  Catheterization on September 2006, showed moderate graft      disease.  2.  Tobacco use.  3.  HIV positive.  4.  Hypertension.  5.  Hyperlipidemia.  6.  Nephrolithiasis.  7.  Normal LV function.   EKG showed normal sinus rhythm, normal axis, no acute changes.   H&H was 15.9 and 44.5, normal indices, platelets 234, wbc 9.0.  PTT 32, PT  12.2, sodium 138, potassium 3.9, BUN 19, creatinine 0.1.  CK-MB was negative  x1.   Chest x-ray results are pending.   HOSPITAL COURSE:  Dr. Eden Emms assessed [name] Smith on Wednesday morning.  Patient was still in the emergency room due to lack of beds.  Dr. Eden Emms  felt the best way to evaluate his chest discomfort will be cardiac  catheterization.  This is performed by Dr. Eden Emms on February 18, 2005.  This showed native three vessel coronary artery disease.  The proximal to  mid stents in the  RCA were widely patent.  The 30% proximal and 30% distal  RCA lesion, the saphenous vein graft to the OM had a 40 to 50% proximal  lesion that remains unchanged from September 2006.  Saphenous vein graft to  the diagonal has a 40% proximal lesion and this had also not changed from  September 2006.  Saphenous vein graft to the ramus was normal and the LIMA  to the LAD was normal.  EF was 60%.  Dr. Eden Emms closed catheterization site  with AngioSeal and post bed rest, it was felt that he could be discharged  home from the short stay.   DISPOSITION:  [name] Smith is discharged home, asked to maintain low salt, fat  and cholesterol diet.  His activity in regard to driving, lifting, sexual  activity is limited for two days.  If he has any problems with his  catheterization site, he was asked to call.  He was asked to continue his  Pravachol 80 mg nightly, atenolol 25 daily, coated aspirin  81 daily, Plavix  75 daily, nitroglycerin 0.4 p.r.n. and his other prescriptions as listed.  He was asked to bring all medications to all appointments.  He will see Dr.  Deniece Ree. in the office on February 28, 2005, at 1:45 p.m. for a groin  check.      Joellyn Rued, P.A. [facility]     EW/MEDQ  D:  02/19/2005  T:  02/20/2005  Job:  [id number]   cc:   Donia Guiles, M.D.  Fax: [phone]   Fransisco Hertz, M.D.  Fax: [phone]

## 2010-08-26 ENCOUNTER — Other Ambulatory Visit (INDEPENDENT_AMBULATORY_CARE_PROVIDER_SITE_OTHER): Payer: BC Managed Care – PPO

## 2010-08-26 DIAGNOSIS — B20 Human immunodeficiency virus [HIV] disease: Secondary | ICD-10-CM

## 2010-08-26 LAB — COMPLETE METABOLIC PANEL WITH GFR
ALT: 29 U/L (ref 0–53)
AST: 27 U/L (ref 0–37)
Albumin: 4.2 g/dL (ref 3.5–5.2)
Alkaline Phosphatase: 69 U/L (ref 39–117)
BUN: 14 mg/dL (ref 6–23)
CO2: 26 mEq/L (ref 19–32)
Calcium: 9.5 mg/dL (ref 8.4–10.5)
Chloride: 104 mEq/L (ref 96–112)
Creat: 0.92 mg/dL (ref 0.50–1.35)
GFR, Est African American: >60 mL/min (ref >60)
GFR, Est Non African American: >60 mL/min (ref >60)
Glucose, Bld: 114 mg/dL — ABNORMAL HIGH (ref 70–99)
Potassium: 4.3 mEq/L (ref 3.5–5.3)
Sodium: 139 mEq/L (ref 135–145)
Total Bilirubin: 0.4 mg/dL (ref 0.3–1.2)
Total Protein: 6.5 g/dL (ref 6.0–8.3)

## 2010-08-26 LAB — CBC WITH DIFFERENTIAL/PLATELET
Basophils Absolute: 0 10*3/uL (ref 0.0–0.1)
Basophils Relative: 0 % (ref 0–1)
Eosinophils Absolute: 0.1 10*3/uL (ref 0.0–0.7)
Eosinophils Relative: 1 % (ref 0–5)
HCT: 49.8 % (ref 39.0–52.0)
Hemoglobin: 16.9 g/dL (ref 13.0–17.0)
Lymphocytes Relative: 25 % (ref 12–46)
Lymphs Abs: 1.8 10*3/uL (ref 0.7–4.0)
MCH: 35.4 pg — ABNORMAL HIGH (ref 26.0–34.0)
MCHC: 33.9 g/dL (ref 30.0–36.0)
MCV: 104.2 fL — ABNORMAL HIGH (ref 78.0–100.0)
Monocytes Absolute: 0.4 10*3/uL (ref 0.1–1.0)
Monocytes Relative: 6 % (ref 3–12)
Neutro Abs: 4.8 10*3/uL (ref 1.7–7.7)
Neutrophils Relative %: 68 % (ref 43–77)
Platelets: 257 10*3/uL (ref 150–400)
RBC: 4.78 MIL/uL (ref 4.22–5.81)
RDW: 12.9 % (ref 11.5–15.5)
WBC: 7.1 10*3/uL (ref 4.0–10.5)

## 2010-08-27 LAB — HIV-1 RNA QUANT-NO REFLEX-BLD
HIV 1 RNA Quant: <20 copies/mL (ref <20)
HIV-1 RNA Quant, Log: <1.3 {Log} (ref <1.30)

## 2010-08-27 LAB — T-HELPER CELL (CD4) - (RCID CLINIC ONLY)
CD4 % Helper T Cell: 43 % (ref 33–55)
CD4 T Cell Abs: 850 uL (ref 400–2700)

## 2010-08-29 ENCOUNTER — Other Ambulatory Visit: Payer: Self-pay | Admitting: *Deleted

## 2010-08-29 ENCOUNTER — Ambulatory Visit: Payer: BC Managed Care – PPO | Admitting: Infectious Diseases

## 2010-08-29 ENCOUNTER — Encounter: Payer: Self-pay | Admitting: Infectious Diseases

## 2010-08-29 DIAGNOSIS — B20 Human immunodeficiency virus [HIV] disease: Secondary | ICD-10-CM

## 2010-08-29 DIAGNOSIS — Z113 Encounter for screening for infections with a predominantly sexual mode of transmission: Secondary | ICD-10-CM

## 2010-08-29 DIAGNOSIS — R52 Pain, unspecified: Secondary | ICD-10-CM

## 2010-08-29 MED ORDER — HYDROCODONE-ACETAMINOPHEN 5-500 MG PO TABS: 1.0000 | ORAL_TABLET | Freq: Four times a day (QID) | ORAL | Status: DC | PRN

## 2010-10-23 ENCOUNTER — Telehealth: Payer: Self-pay | Admitting: *Deleted

## 2010-10-28 ENCOUNTER — Encounter: Payer: Self-pay | Admitting: Cardiovascular Disease

## 2010-10-28 ENCOUNTER — Ambulatory Visit (INDEPENDENT_AMBULATORY_CARE_PROVIDER_SITE_OTHER): Payer: BC Managed Care – PPO | Admitting: Cardiovascular Disease

## 2010-10-28 ENCOUNTER — Encounter: Payer: Self-pay | Admitting: *Deleted

## 2010-10-28 VITALS — BP 131/78 | HR 102 | Resp 12 | Ht 69.0 in | Wt 157.0 lb

## 2010-10-28 DIAGNOSIS — I2510 Atherosclerotic heart disease of native coronary artery without angina pectoris: Secondary | ICD-10-CM

## 2010-10-28 DIAGNOSIS — E7800 Pure hypercholesterolemia, unspecified: Secondary | ICD-10-CM

## 2010-10-28 DIAGNOSIS — I10 Essential (primary) hypertension: Secondary | ICD-10-CM

## 2010-10-28 DIAGNOSIS — R0989 Other specified symptoms and signs involving the circulatory and respiratory systems: Secondary | ICD-10-CM | POA: Insufficient documentation

## 2010-10-28 DIAGNOSIS — B20 Human immunodeficiency virus [HIV] disease: Secondary | ICD-10-CM

## 2010-10-28 NOTE — Assessment & Plan Note (Signed)
Redo CABG 10/11  NO angina.  Not motivated to quit smoking.  Medical Rx

## 2010-10-28 NOTE — Progress Notes (Signed)
[name] Smith is seen today for F/U of dyspnea, CAD S/P CABG x2 With redo 10/11. EF has been mildly reduced in the 45% range. No clinical history of CHF. No arryhthmia and no indication for AICD. Still smoking. Clinical COPD. Recent virus and still wheezing. Did not want to spend money on inhaler. Called pulmonary to get him a Xopenex sample. No SSCP, dyspnea from smoking. NO palpitatons or edema. Lots of stress regarding work and possible disability application. HIV stable and F/U with Dr Maurice March. 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  Still smoking and not motivated to quit.   Note for insurance no work 10/22-11 to 04/11/10 post redo CABG  ROS: Denies fever, malais, weight loss, blurry vision, decreased visual acuity, cough, sputum, SOB, hemoptysis, pleuritic pain, palpitaitons, heartburn, abdominal pain, melena, lower extremity edema, claudication, or rash.  All other systems reviewed and negative  General: Affect appropriate Healthy:  appears stated age HEENT: normal Neck supple with no adenopathy JVP normal no bruits no thyromegaly Lungs clear with no wheezing and good diaphragmatic motion Heart:  S1/S2 no murmur,rub, gallop or click PMI normal Abdomen: benighn, BS positve, no tenderness, no AAA no bruit.  No HSM or HJR Distal pulses intact with no bruits No edema Neuro non-focal Skin warm and dry No muscular weakness   Current Outpatient Prescriptions  Medication Sig Dispense Refill  . ALPRAZolam (XANAX) 0.25 MG tablet Take 0.25 mg by mouth at bedtime as needed.        Marland Kitchen aspirin 325 MG EC tablet Take 325 mg by mouth daily.        . clopidogrel (PLAVIX) 75 MG tablet Take 75 mg by mouth daily.        Marland Kitchen efavirenz-emtrictabine-tenofovir (ATRIPLA) 600-200-300 MG per tablet Take 1 tablet by mouth daily.        Marland Kitchen FLUoxetine (PROZAC) 10 MG capsule Take 10 mg by mouth daily.        Marland Kitchen FOLIC ACID PO Take 1  tablet by mouth daily.        Marland Kitchen HYDROcodone-acetaminophen (VICODIN) 5-500 MG per tablet Take 1 tablet by mouth every 6 (six) hours as needed.  120 tablet  2  . metoprolol (TOPROL-XL) 50 MG 24 hr tablet Take 50 mg by mouth daily.        . nicotine (NICODERM CQ - DOSED IN MG/24 HOURS) 21 mg/24hr patch Place 1 patch onto the skin as directed.        . nitroGLYCERIN (NITROSTAT) 0.4 MG SL tablet Place 0.4 mg under the tongue every 5 (five) minutes as needed. For chest pain -- may repeat times three       . simvastatin (ZOCOR) 80 MG tablet Take 80 mg by mouth at bedtime.          Allergies  Review of patient's allergies indicates no known allergies.  Electrocardiogram:  Assessment and Plan

## 2010-10-28 NOTE — Assessment & Plan Note (Signed)
Cholesterol is at goal.  Continue current dose of statin and diet Rx.  No myalgias or side effects.  F/U  LFT's in 6 months. Lab Results  Component Value Date   LDLCALC  Value: 98        Total Cholesterol/HDL:CHD Risk Coronary Heart Disease Risk Table                     Men   Women  1/2 Average Risk   3.4   3.3  Average Risk       5.0   4.4  2 X Average Risk   9.6   7.1  3 X Average Risk  23.4   11.0        Use the calculated Patient Ratio above and the CHD Risk Table to determine the patient's CHD Risk.        ATP III CLASSIFICATION (LDL):  <100     mg/dL   Optimal  696-295  mg/dL   Near or Above                    Optimal  130-159  mg/dL   Borderline  284-132  mg/dL   High  >440     mg/dL   Very High 01/04/2535

## 2010-10-28 NOTE — Assessment & Plan Note (Signed)
Right bruit new. F/U carotid duplex

## 2010-10-28 NOTE — Patient Instructions (Signed)

## 2010-10-28 NOTE — Assessment & Plan Note (Signed)
Well controlled.  Continue current medications and low sodium Dash type diet.    

## 2010-10-28 NOTE — Assessment & Plan Note (Signed)
STable F/U CD 4 count Dr Maurice March

## 2010-10-30 NOTE — Telephone Encounter (Signed)
Opened in error

## 2010-11-06 ENCOUNTER — Other Ambulatory Visit: Payer: Self-pay | Admitting: *Deleted

## 2010-11-06 MED ORDER — CLOPIDOGREL BISULFATE 75 MG PO TABS: 75.0000 mg | ORAL_TABLET | Freq: Every day | ORAL | Status: DC

## 2010-11-25 ENCOUNTER — Encounter (INDEPENDENT_AMBULATORY_CARE_PROVIDER_SITE_OTHER): Payer: BC Managed Care – PPO | Admitting: Cardiology

## 2010-11-25 DIAGNOSIS — R0989 Other specified symptoms and signs involving the circulatory and respiratory systems: Secondary | ICD-10-CM

## 2010-11-25 DIAGNOSIS — I6529 Occlusion and stenosis of unspecified carotid artery: Secondary | ICD-10-CM

## 2010-12-03 DIAGNOSIS — Z0271 Encounter for disability determination: Secondary | ICD-10-CM

## 2010-12-05 LAB — T-HELPER CELL (CD4) - (RCID CLINIC ONLY)
CD4 % Helper T Cell: 34
CD4 T Cell Abs: 830

## 2010-12-12 ENCOUNTER — Telehealth: Payer: Self-pay | Admitting: *Deleted

## 2010-12-12 LAB — T-HELPER CELL (CD4) - (RCID CLINIC ONLY)
CD4 % Helper T Cell: 41 % (ref 33–55)
CD4 T Cell Abs: 1080 uL (ref 400–2700)

## 2010-12-13 LAB — T-HELPER CELL (CD4) - (RCID CLINIC ONLY)
CD4 % Helper T Cell: 39
CD4 T Cell Abs: 950

## 2010-12-17 ENCOUNTER — Telehealth: Payer: Self-pay | Admitting: *Deleted

## 2010-12-17 LAB — T-HELPER CELL (CD4) - (RCID CLINIC ONLY)
CD4 % Helper T Cell: 36
CD4 T Cell Abs: 110 — ABNORMAL LOW

## 2010-12-17 NOTE — Telephone Encounter (Signed)
LM for patient to call and schedule an appt for lab and md appt.

## 2010-12-20 NOTE — Telephone Encounter (Signed)
Opened in error

## 2010-12-23 LAB — T-HELPER CELL (CD4) - (RCID CLINIC ONLY)
CD4 % Helper T Cell: 37
CD4 T Cell Abs: 850

## 2010-12-24 ENCOUNTER — Other Ambulatory Visit: Payer: BC Managed Care – PPO

## 2010-12-24 ENCOUNTER — Other Ambulatory Visit: Payer: Self-pay | Admitting: Infectious Diseases

## 2010-12-24 DIAGNOSIS — Z113 Encounter for screening for infections with a predominantly sexual mode of transmission: Secondary | ICD-10-CM

## 2010-12-24 DIAGNOSIS — B20 Human immunodeficiency virus [HIV] disease: Secondary | ICD-10-CM

## 2010-12-24 LAB — URINALYSIS, ROUTINE W REFLEX MICROSCOPIC
Bilirubin Urine: NEGATIVE
Glucose, UA: NEGATIVE mg/dL
Hgb urine dipstick: NEGATIVE
Ketones, ur: NEGATIVE mg/dL
Leukocytes, UA: NEGATIVE
Nitrite: NEGATIVE
Protein, ur: NEGATIVE mg/dL
Specific Gravity, Urine: 1.015 (ref 1.005–1.030)
Urobilinogen, UA: 0.2 mg/dL (ref 0.0–1.0)
pH: 7.5 (ref 5.0–8.0)

## 2010-12-24 LAB — RPR

## 2010-12-25 LAB — COMPLETE METABOLIC PANEL WITH GFR
ALT: 44 U/L (ref 0–53)
AST: 54 U/L — ABNORMAL HIGH (ref 0–37)
Albumin: 4.4 g/dL (ref 3.5–5.2)
Alkaline Phosphatase: 73 U/L (ref 39–117)
BUN: 14 mg/dL (ref 6–23)
CO2: 26 mEq/L (ref 19–32)
Calcium: 9.5 mg/dL (ref 8.4–10.5)
Chloride: 102 mEq/L (ref 96–112)
Creat: 1.02 mg/dL (ref 0.50–1.35)
GFR, Est African American: >90 mL/min (ref >90)
GFR, Est Non African American: 86 mL/min — ABNORMAL LOW (ref >90)
Glucose, Bld: 103 mg/dL — ABNORMAL HIGH (ref 70–99)
Potassium: 4.6 mEq/L (ref 3.5–5.3)
Sodium: 139 mEq/L (ref 135–145)
Total Bilirubin: 0.6 mg/dL (ref 0.3–1.2)
Total Protein: 7.2 g/dL (ref 6.0–8.3)

## 2010-12-25 LAB — CBC WITH DIFFERENTIAL/PLATELET
Basophils Absolute: 0 10*3/uL (ref 0.0–0.1)
Basophils Relative: 0 % (ref 0–1)
Eosinophils Absolute: 0.1 10*3/uL (ref 0.0–0.7)
Eosinophils Relative: 1 % (ref 0–5)
HCT: 50.5 % (ref 39.0–52.0)
Hemoglobin: 17.2 g/dL — ABNORMAL HIGH (ref 13.0–17.0)
Lymphocytes Relative: 27 % (ref 12–46)
Lymphs Abs: 2 10*3/uL (ref 0.7–4.0)
MCH: 35.4 pg — ABNORMAL HIGH (ref 26.0–34.0)
MCHC: 34.1 g/dL (ref 30.0–36.0)
MCV: 103.9 fL — ABNORMAL HIGH (ref 78.0–100.0)
Monocytes Absolute: 0.5 10*3/uL (ref 0.1–1.0)
Monocytes Relative: 7 % (ref 3–12)
Neutro Abs: 4.9 10*3/uL (ref 1.7–7.7)
Neutrophils Relative %: 65 % (ref 43–77)
Platelets: 247 10*3/uL (ref 150–400)
RBC: 4.86 MIL/uL (ref 4.22–5.81)
RDW: 12.7 % (ref 11.5–15.5)
WBC: 7.5 10*3/uL (ref 4.0–10.5)

## 2010-12-25 LAB — T-HELPER CELL (CD4) - (RCID CLINIC ONLY)
CD4 % Helper T Cell: 40 % (ref 33–55)
CD4 T Cell Abs: 770 uL (ref 400–2700)

## 2010-12-25 LAB — GC/CHLAMYDIA PROBE AMP, URINE
Chlamydia, Swab/Urine, PCR: NEGATIVE
GC Probe Amp, Urine: NEGATIVE

## 2010-12-26 LAB — HIV-1 RNA QUANT-NO REFLEX-BLD
HIV 1 RNA Quant: <20 copies/mL (ref <20)
HIV-1 RNA Quant, Log: <1.3 {Log} (ref <1.30)

## 2011-01-09 ENCOUNTER — Other Ambulatory Visit: Payer: Self-pay | Admitting: Licensed Clinical Social Worker

## 2011-01-09 ENCOUNTER — Ambulatory Visit: Payer: BC Managed Care – PPO | Admitting: Infectious Diseases

## 2011-01-09 ENCOUNTER — Encounter: Payer: Self-pay | Admitting: Infectious Diseases

## 2011-01-09 VITALS — BP 131/83 | HR 97 | Temp 98.1°F | Wt 153.0 lb

## 2011-01-09 DIAGNOSIS — R52 Pain, unspecified: Secondary | ICD-10-CM

## 2011-01-09 DIAGNOSIS — N529 Male erectile dysfunction, unspecified: Secondary | ICD-10-CM

## 2011-01-09 DIAGNOSIS — B20 Human immunodeficiency virus [HIV] disease: Secondary | ICD-10-CM

## 2011-01-09 MED ORDER — SILDENAFIL CITRATE 50 MG PO TABS: 50.0000 mg | ORAL_TABLET | Freq: Every day | ORAL | Status: DC | PRN

## 2011-01-09 MED ORDER — HYDROCODONE-ACETAMINOPHEN 5-500 MG PO TABS: 1.0000 | ORAL_TABLET | Freq: Four times a day (QID) | ORAL | Status: DC | PRN

## 2011-01-09 NOTE — Patient Instructions (Signed)
Please return to the office in 5-6 months Labs 2 weeks prior

## 2011-02-15 ENCOUNTER — Other Ambulatory Visit: Payer: Self-pay | Admitting: Cardiovascular Disease

## 2011-03-14 ENCOUNTER — Other Ambulatory Visit: Payer: Self-pay | Admitting: *Deleted

## 2011-03-14 DIAGNOSIS — R52 Pain, unspecified: Secondary | ICD-10-CM

## 2011-03-14 MED ORDER — HYDROCODONE-ACETAMINOPHEN 5-500 MG PO TABS: 1.0000 | ORAL_TABLET | Freq: Four times a day (QID) | ORAL | Status: DC | PRN

## 2011-04-08 ENCOUNTER — Other Ambulatory Visit: Payer: Self-pay | Admitting: Infectious Diseases

## 2011-04-08 DIAGNOSIS — B20 Human immunodeficiency virus [HIV] disease: Secondary | ICD-10-CM

## 2011-04-18 ENCOUNTER — Other Ambulatory Visit: Payer: Self-pay | Admitting: *Deleted

## 2011-04-18 MED ORDER — NITROGLYCERIN 0.4 MG SL SUBL: 0.4000 mg | SUBLINGUAL_TABLET | SUBLINGUAL | Status: DC | PRN

## 2011-04-22 ENCOUNTER — Telehealth: Payer: Self-pay

## 2011-04-22 ENCOUNTER — Other Ambulatory Visit: Payer: Self-pay | Admitting: *Deleted

## 2011-04-22 ENCOUNTER — Ambulatory Visit: Payer: BC Managed Care – PPO

## 2011-04-22 NOTE — Telephone Encounter (Signed)
Patient said he cannot pay the 600.00 per month to retain his [employer] - came in today to apply for ADAP and RW - he brought in 2011 taxes and no proof he no longer has insurance - called Kathlene November at [employer] and they will not accept 2011 taxes as income even if self-employed - Kathlene November stated they had similar instance last year and they did not accept it even with income signature card. Called patient to let him know and left message for him to call me. Advised him to talk to his tax advisor to see what he could do to document income - patient said he would not file until Oct of this year - also to see if he could get letter from his insurance stating he dropped coverage, per ADAP's request.

## 2011-04-22 NOTE — Telephone Encounter (Signed)
States he has had head & chest congestion for almost 2 weeks. He has BCBS until next week. Has a pcp but does not want to go there. States Dr. Maurice March will send a rx in without a visit. I told him I will check with md as I cannot send it in. md paged.  md returned call. Pt needs to be seen. He will be seen by our NP on thursday afternoon at 2pm. Does not want to see his pcp. He is taking mucinex

## 2011-04-23 ENCOUNTER — Telehealth: Payer: Self-pay

## 2011-04-23 ENCOUNTER — Other Ambulatory Visit: Payer: Self-pay | Admitting: *Deleted

## 2011-04-23 MED ORDER — NITROGLYCERIN 0.4 MG SL SUBL: 0.4000 mg | SUBLINGUAL_TABLET | SUBLINGUAL | Status: DC | PRN

## 2011-04-23 NOTE — Telephone Encounter (Signed)
Called patient back and left message for him to call Randolm Idol for medication assistance, since ADAP won't accept 2011 taxes for self-employed - let Pam know what was going on - she did say he needed letter to state he no longer had BCBS, same as ADAP.

## 2011-04-24 ENCOUNTER — Ambulatory Visit: Payer: BC Managed Care – PPO | Admitting: Adult Health

## 2011-04-25 ENCOUNTER — Telehealth: Payer: Self-pay | Admitting: *Deleted

## 2011-04-25 ENCOUNTER — Ambulatory Visit: Payer: BC Managed Care – PPO | Admitting: Adult Health

## 2011-04-25 NOTE — Telephone Encounter (Signed)
Front desk called to say the pt cancelled since he now feels better

## 2011-04-28 ENCOUNTER — Telehealth: Payer: Self-pay | Admitting: Adult Health

## 2011-04-28 NOTE — Telephone Encounter (Signed)
Received call from [name] Smith-  He made an appointment to see me tomorrow at 11:30.  I have printed the applications needed for his visit.

## 2011-04-29 ENCOUNTER — Encounter: Payer: Self-pay | Admitting: Adult Health

## 2011-04-29 ENCOUNTER — Ambulatory Visit (INDEPENDENT_AMBULATORY_CARE_PROVIDER_SITE_OTHER): Payer: BC Managed Care – PPO | Admitting: Adult Health

## 2011-04-29 ENCOUNTER — Other Ambulatory Visit: Payer: Self-pay | Admitting: *Deleted

## 2011-04-29 ENCOUNTER — Telehealth: Payer: Self-pay | Admitting: Infectious Diseases

## 2011-04-29 VITALS — BP 153/81 | HR 93 | Temp 98.3°F | Wt 157.0 lb

## 2011-04-29 DIAGNOSIS — I10 Essential (primary) hypertension: Secondary | ICD-10-CM

## 2011-04-29 DIAGNOSIS — E7800 Pure hypercholesterolemia, unspecified: Secondary | ICD-10-CM

## 2011-04-29 DIAGNOSIS — F419 Anxiety disorder, unspecified: Secondary | ICD-10-CM

## 2011-04-29 DIAGNOSIS — N529 Male erectile dysfunction, unspecified: Secondary | ICD-10-CM

## 2011-04-29 DIAGNOSIS — B20 Human immunodeficiency virus [HIV] disease: Secondary | ICD-10-CM

## 2011-04-29 DIAGNOSIS — I2510 Atherosclerotic heart disease of native coronary artery without angina pectoris: Secondary | ICD-10-CM

## 2011-04-29 DIAGNOSIS — J069 Acute upper respiratory infection, unspecified: Secondary | ICD-10-CM | POA: Insufficient documentation

## 2011-04-29 MED ORDER — EFAVIRENZ-EMTRICITAB-TENOFOVIR 600-200-300 MG PO TABS: 1.0000 | ORAL_TABLET | Freq: Every day | ORAL | Status: DC

## 2011-04-29 MED ORDER — ALPRAZOLAM 0.25 MG PO TABS: 0.2500 mg | ORAL_TABLET | Freq: Every evening | ORAL | Status: DC | PRN

## 2011-04-29 MED ORDER — SILDENAFIL CITRATE 50 MG PO TABS: 50.0000 mg | ORAL_TABLET | Freq: Every day | ORAL | Status: DC | PRN

## 2011-04-29 MED ORDER — SIMVASTATIN 80 MG PO TABS: 80.0000 mg | ORAL_TABLET | Freq: Every day | ORAL | Status: DC

## 2011-04-29 MED ORDER — CLOPIDOGREL BISULFATE 75 MG PO TABS: 75.0000 mg | ORAL_TABLET | Freq: Every day | ORAL | Status: DC

## 2011-04-29 MED ORDER — METOPROLOL SUCCINATE ER 50 MG PO TB24: 50.0000 mg | ORAL_TABLET | Freq: Every day | ORAL | Status: DC

## 2011-04-29 NOTE — Patient Instructions (Signed)
Guaifenesin/Dextromethorphan15 mL every 4 hours when necessary  Ibuprofen. 600 mg every 6-8 hours when necessary , fever, and discomfort  Diphenhydramine.25-50 mg by mouth each bedtime when necessary  Cetirizine 10 mg by mouth every morning Bed rest for at least the next 2 days. Increase fluid intake. Warm packs over sinuses if needed. Proper nutrition with a least 3 meals a day. Contact clinic or go to urgent care. If symptoms worsen over the next 5 days or do not improve in the next 7 days.  

## 2011-04-29 NOTE — Assessment & Plan Note (Signed)
Guaifenesin/Dextromethorphan15 mL every 4 hours when necessary  Ibuprofen. 600 mg every 6-8 hours when necessary , fever, and discomfort  Diphenhydramine.25-50 mg by mouth each bedtime when necessary  Cetirizine 10 mg by mouth every morning Bed rest for at least the next 2 days. Increase fluid intake. Warm packs over sinuses if needed. Proper nutrition with a least 3 meals a day. Contact clinic or go to urgent care. If symptoms worsen over the next 5 days or do not improve in the next 7 days.

## 2011-04-29 NOTE — Telephone Encounter (Signed)
[name] Smith came in and signed his applications for his medications.  Have given them to [facility] to sign

## 2011-04-29 NOTE — Progress Notes (Signed)
Sick Call  Subjective: [name] Smith is an 51 y.o. male.   Patient complains of 20 days of coryza, congestion, nasal blockage, post nasal drip, productive cough, bilateral sinus pain and green nasal discharge.  Objective: HEENT: mucosal irritation and Bilateral TMs slightly bulging with clear fluid. Cardio: RRR and rub Resp: Ronchi and Clears with cough GI: BS positive and Nontender Extremity:  Pulses positive and No Edema Skin:   Intact Neuro: Alert/Oriented, Cranial Nerve II-XII normal, Normal Sensory, Normal Motor and Normal FMC Musc/Skel:  Normal  Assessment/Plan: URI (upper respiratory infection) Guaifenesin/Dextromethorphan15 mL every 4 hours when necessary  Ibuprofen. 600 mg every 6-8 hours when necessary , fever, and discomfort  Diphenhydramine.25-50 mg by mouth each bedtime when necessary  Cetirizine 10 mg by mouth every morning Bed rest for at least the next 2 days. Increase fluid intake. Warm packs over sinuses if needed. Proper nutrition with a least 3 meals a day. Contact clinic or go to urgent care. If symptoms worsen over the next 5 days or do not improve in the next 7 days.     Will consider a course of steroids. If his symptoms persist.     Otha Rickles A. Sundra Aland, MS, [facility] [phone]  04/29/2011, 10:59 AM

## 2011-04-30 ENCOUNTER — Telehealth: Payer: Self-pay | Admitting: Infectious Diseases

## 2011-04-30 NOTE — Telephone Encounter (Signed)
Faxed and mailed applications to Connection to Care (Viagra), Together RX Access ((Xanax), [place] [employer] [place] (Toprol-XL), [facility]reach (Plavix) and Atripla Assistance Program (Atripla)  Will followup next week on the applications.

## 2011-05-01 ENCOUNTER — Telehealth: Payer: Self-pay | Admitting: Infectious Diseases

## 2011-05-01 NOTE — Telephone Encounter (Signed)
Received fax from Atripla Patient Assistance.  [name] Smith has qualified for temporary assistance for his Atripla.  Coverage dates 05-01-11 - 07-30-11 or until ADAP is approved.

## 2011-05-05 ENCOUNTER — Ambulatory Visit: Payer: BC Managed Care – PPO | Admitting: Cardiovascular Disease

## 2011-05-07 ENCOUNTER — Other Ambulatory Visit: Payer: Self-pay | Admitting: *Deleted

## 2011-05-07 DIAGNOSIS — F329 Major depressive disorder, single episode, unspecified: Secondary | ICD-10-CM

## 2011-05-07 DIAGNOSIS — F32A Depression, unspecified: Secondary | ICD-10-CM

## 2011-05-07 MED ORDER — FLUOXETINE HCL 10 MG PO CAPS: 10.0000 mg | ORAL_CAPSULE | Freq: Every day | ORAL | Status: DC

## 2011-05-23 ENCOUNTER — Other Ambulatory Visit: Payer: Self-pay | Admitting: *Deleted

## 2011-05-23 DIAGNOSIS — Z7721 Contact with and (suspected) exposure to potentially hazardous body fluids: Secondary | ICD-10-CM

## 2011-05-26 ENCOUNTER — Other Ambulatory Visit: Payer: Self-pay | Admitting: *Deleted

## 2011-05-26 ENCOUNTER — Telehealth: Payer: Self-pay | Admitting: *Deleted

## 2011-05-26 DIAGNOSIS — N529 Male erectile dysfunction, unspecified: Secondary | ICD-10-CM

## 2011-05-26 MED ORDER — SILDENAFIL CITRATE 50 MG PO TABS: 50.0000 mg | ORAL_TABLET | Freq: Every day | ORAL | Status: DC | PRN

## 2011-05-26 NOTE — Telephone Encounter (Signed)
RN spoke with [name] Smith.  RN advised that the pt would need to approve any communication.  Phone call to pt.  Pt does not know anything about this program.  RN called RX Outreach and left message that the pt does not want to participate.

## 2011-05-26 NOTE — Telephone Encounter (Signed)
Pt notified that rx has arrived at [facility] for pt pickup.

## 2011-05-27 ENCOUNTER — Other Ambulatory Visit: Payer: Self-pay | Admitting: Infectious Diseases

## 2011-05-27 DIAGNOSIS — E7800 Pure hypercholesterolemia, unspecified: Secondary | ICD-10-CM

## 2011-05-27 DIAGNOSIS — Z79899 Other long term (current) drug therapy: Secondary | ICD-10-CM

## 2011-06-02 ENCOUNTER — Other Ambulatory Visit (INDEPENDENT_AMBULATORY_CARE_PROVIDER_SITE_OTHER): Payer: Self-pay

## 2011-06-02 DIAGNOSIS — Z79899 Other long term (current) drug therapy: Secondary | ICD-10-CM

## 2011-06-02 DIAGNOSIS — B20 Human immunodeficiency virus [HIV] disease: Secondary | ICD-10-CM

## 2011-06-02 DIAGNOSIS — E7800 Pure hypercholesterolemia, unspecified: Secondary | ICD-10-CM

## 2011-06-02 LAB — COMPREHENSIVE METABOLIC PANEL
ALT: 30 U/L (ref 0–53)
AST: 26 U/L (ref 0–37)
Albumin: 4.5 g/dL (ref 3.5–5.2)
Alkaline Phosphatase: 73 U/L (ref 39–117)
BUN: 14 mg/dL (ref 6–23)
CO2: 29 mEq/L (ref 19–32)
Calcium: 9.7 mg/dL (ref 8.4–10.5)
Chloride: 101 mEq/L (ref 96–112)
Creat: 1.05 mg/dL (ref 0.50–1.35)
Glucose, Bld: 147 mg/dL — ABNORMAL HIGH (ref 70–99)
Potassium: 4.3 mEq/L (ref 3.5–5.3)
Sodium: 138 mEq/L (ref 135–145)
Total Bilirubin: 0.6 mg/dL (ref 0.3–1.2)
Total Protein: 7.3 g/dL (ref 6.0–8.3)

## 2011-06-02 LAB — CBC WITH DIFFERENTIAL/PLATELET
Basophils Absolute: 0 10*3/uL (ref 0.0–0.1)
Basophils Relative: 0 % (ref 0–1)
Eosinophils Absolute: 0.1 10*3/uL (ref 0.0–0.7)
Eosinophils Relative: 1 % (ref 0–5)
HCT: 51.7 % (ref 39.0–52.0)
Hemoglobin: 17.5 g/dL — ABNORMAL HIGH (ref 13.0–17.0)
Lymphocytes Relative: 33 % (ref 12–46)
Lymphs Abs: 2.2 10*3/uL (ref 0.7–4.0)
MCH: 34.2 pg — ABNORMAL HIGH (ref 26.0–34.0)
MCHC: 33.8 g/dL (ref 30.0–36.0)
MCV: 101 fL — ABNORMAL HIGH (ref 78.0–100.0)
Monocytes Absolute: 0.3 10*3/uL (ref 0.1–1.0)
Monocytes Relative: 5 % (ref 3–12)
Neutro Abs: 4 10*3/uL (ref 1.7–7.7)
Neutrophils Relative %: 60 % (ref 43–77)
Platelets: 219 10*3/uL (ref 150–400)
RBC: 5.12 MIL/uL (ref 4.22–5.81)
RDW: 13 % (ref 11.5–15.5)
WBC: 6.7 10*3/uL (ref 4.0–10.5)

## 2011-06-02 LAB — LIPID PANEL
Cholesterol: 236 mg/dL — ABNORMAL HIGH (ref 0–200)
HDL: 62 mg/dL (ref >39)
LDL Cholesterol: 136 mg/dL — ABNORMAL HIGH (ref 0–99)
Total CHOL/HDL Ratio: 3.8 Ratio
Triglycerides: 192 mg/dL — ABNORMAL HIGH (ref <150)
VLDL: 38 mg/dL (ref 0–40)

## 2011-06-03 LAB — T-HELPER CELL (CD4) - (RCID CLINIC ONLY)
CD4 % Helper T Cell: 40 % (ref 33–55)
CD4 T Cell Abs: 890 uL (ref 400–2700)

## 2011-06-04 LAB — HIV-1 RNA QUANT-NO REFLEX-BLD
HIV 1 RNA Quant: <20 copies/mL (ref <20)
HIV-1 RNA Quant, Log: <1.3 {Log} (ref <1.30)

## 2011-06-09 ENCOUNTER — Other Ambulatory Visit: Payer: Self-pay | Admitting: *Deleted

## 2011-06-09 DIAGNOSIS — R52 Pain, unspecified: Secondary | ICD-10-CM

## 2011-06-09 MED ORDER — HYDROCODONE-ACETAMINOPHEN 5-500 MG PO TABS: 1.0000 | ORAL_TABLET | Freq: Four times a day (QID) | ORAL | Status: DC | PRN

## 2011-06-19 ENCOUNTER — Encounter: Payer: Self-pay | Admitting: Infectious Diseases

## 2011-06-19 ENCOUNTER — Ambulatory Visit (INDEPENDENT_AMBULATORY_CARE_PROVIDER_SITE_OTHER): Payer: Self-pay | Admitting: Infectious Diseases

## 2011-06-19 VITALS — BP 160/88 | HR 99 | Temp 98.0°F | Wt 157.0 lb

## 2011-06-19 DIAGNOSIS — R52 Pain, unspecified: Secondary | ICD-10-CM

## 2011-06-19 DIAGNOSIS — E7800 Pure hypercholesterolemia, unspecified: Secondary | ICD-10-CM

## 2011-06-19 MED ORDER — HYDROCODONE-ACETAMINOPHEN 5-500 MG PO TABS: 1.0000 | ORAL_TABLET | Freq: Four times a day (QID) | ORAL | Status: DC | PRN

## 2011-06-19 MED ORDER — SIMVASTATIN 80 MG PO TABS: 120.0000 mg | ORAL_TABLET | Freq: Every day | ORAL | Status: DC

## 2011-06-19 NOTE — Progress Notes (Signed)
Patient ID: TYRIC RODEHEAVER, male   DOB: 1960-09-12, 51 y.o.   MRN: [id number] Kaylem is doing well and his here for f/u of HIV infection. He has lost his health insurance and I am his main source of care for his CAD and smoking. He is adherent mostly to his HIV meds and his HIV is undectable and his CD4 count is stable. He has had no fever, weight loss or other constitutional symptoms. He has not had any angina but he continues to smoke and is not very motivated to quit but told him we would help if he changes his mind. His LDL is 131 and total cholesterol is 226 and he says he adheres to his simvistatin/Zocor 80 mg daily. He is only working part time and has applied for disability. Exam:BP 160/88  Pulse 99  Temp(Src) 98 F (36.7 C) (Oral)  Wt 157 lb (71.215 kg) No rashes. Chest clear without wheezes. Has had PFTs in past showing mild to moderate COPD changes. Cardiac:Gr ii/vI systolic murmur that is unchanged. No gallops, rubs or JVD. Impression and Plans: 1.HIV stable and continue Atripla 2.CAD stable but continued smoking and not at goal for lipids is worrisome. Will increase simivistatin to 120mg  daily. Followup in 4-5 months. Lina Sayre

## 2011-07-03 ENCOUNTER — Telehealth: Payer: Self-pay | Admitting: *Deleted

## 2011-07-03 NOTE — Telephone Encounter (Signed)
Patient called back stating that he just signed up for medications on patient assistance and doesn't understand why it's expiring so soon.

## 2011-07-03 NOTE — Telephone Encounter (Signed)
I called pt & left message. The assistance was for 90 days only. Must apply for ADAP or  another  App to go thru pt assistance. Please ask him to call [facility] or [facility] to help with this. The usual person who does this is not here & I will not be here until next Thursday.

## 2011-07-03 NOTE — Telephone Encounter (Signed)
I rec'd a fax from pt assistance program stating his help with this expires 07/30/11. I spoke with Britta Mccreedy to see if he has applied for ADAP. He has not. He was to have done his taxes & brought that in for applying. 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 I called & left him a message asking him to call me.  He will need to apply for further help with pt assistance if he is not going to bring a copy of his taxes in. Will pend awaiting his call

## 2011-07-04 ENCOUNTER — Telehealth: Payer: Self-pay | Admitting: *Deleted

## 2011-07-04 NOTE — Telephone Encounter (Signed)
Patient called and advised was returning a call from [place]. Advised him of the message she was trying to give him about patient assistance medication. He had some concerns as they said 90 days but he only received a 30 day supply once. Advised him will have to check with Kennon Rounds and/or Elita Quick and give him a call back. He advised he was actually busy at this time and will have to call back Monday anyway.

## 2011-07-07 NOTE — Telephone Encounter (Signed)
[place]. He stated he has plenty for now but will need some Atripla soon. I explained the application he did was for 90 days only while he applied for Adap.  He did not make an appt with Britta Mccreedy to do this. States he will call her for an appt when he gets home Wednesday or Thursday. Says he got Atripla while in the hospital recently so has extra pills.   fyi to [employer]

## 2011-07-31 ENCOUNTER — Telehealth: Payer: Self-pay | Admitting: *Deleted

## 2011-07-31 NOTE — Telephone Encounter (Signed)
Atripla pt assistance program sent a fax stating they have d/c him as of 07/30/11. I called him again and left a messahe asking him to call me about his meds

## 2011-08-01 ENCOUNTER — Telehealth: Payer: Self-pay | Admitting: *Deleted

## 2011-08-01 NOTE — Telephone Encounter (Signed)
I reached him today. I asked about coming in so we can try to get the Atripla forms done as he was d/c'd 07/30/11. He stated he will be here Tuesday with all needed papers. He wants it to be expedited. I told him we will send it in as soon as he is here & forms are done. I cannot say how long the other end will take. He was ok with this

## 2011-08-05 ENCOUNTER — Telehealth: Payer: Self-pay | Admitting: *Deleted

## 2011-08-05 NOTE — Telephone Encounter (Signed)
He came in for help with applying for Atripla. He brought all of his financial forms with him. He states he needs help with all  Of his meds. Has not yet applied for ADAP. I urged him to call Britta Mccreedy for an appt. (she is off today). He has about 1 month of atripla left. I will give the app & forms to Randolm Idol who works with patient assistance. We can start forms for other drugs & he can sign when he comes in to see [facility]. He will need to apply for the orange card as well. He has spent all of his 401K paying for his drugs

## 2011-08-06 ENCOUNTER — Other Ambulatory Visit: Payer: Self-pay | Admitting: *Deleted

## 2011-08-06 DIAGNOSIS — B20 Human immunodeficiency virus [HIV] disease: Secondary | ICD-10-CM

## 2011-08-06 MED ORDER — EFAVIRENZ-EMTRICITAB-TENOFOVIR 600-200-300 MG PO TABS: 1.0000 | ORAL_TABLET | Freq: Every day | ORAL | Status: DC

## 2011-08-06 NOTE — Telephone Encounter (Signed)
Rx printed to go with PAP application. 

## 2011-08-07 ENCOUNTER — Ambulatory Visit: Payer: Self-pay

## 2011-08-07 ENCOUNTER — Other Ambulatory Visit: Payer: Self-pay | Admitting: *Deleted

## 2011-08-07 DIAGNOSIS — B20 Human immunodeficiency virus [HIV] disease: Secondary | ICD-10-CM

## 2011-08-11 ENCOUNTER — Telehealth: Payer: Self-pay | Admitting: Infectious Diseases

## 2011-08-11 ENCOUNTER — Other Ambulatory Visit: Payer: Self-pay | Admitting: *Deleted

## 2011-08-11 ENCOUNTER — Ambulatory Visit: Payer: Self-pay

## 2011-08-11 DIAGNOSIS — I2510 Atherosclerotic heart disease of native coronary artery without angina pectoris: Secondary | ICD-10-CM

## 2011-08-11 MED ORDER — NITROGLYCERIN 0.4 MG SL SUBL: 0.4000 mg | SUBLINGUAL_TABLET | SUBLINGUAL | Status: AC | PRN

## 2011-08-11 NOTE — Telephone Encounter (Signed)
Received fax from Atripla Patient Assistance.  Patient has been approved 08-11-11 - 11-09-11 or until ADAP coverage is obtained.  Called patient and informed him.

## 2011-08-13 ENCOUNTER — Other Ambulatory Visit: Payer: Self-pay | Admitting: *Deleted

## 2011-08-13 DIAGNOSIS — F32A Depression, unspecified: Secondary | ICD-10-CM

## 2011-08-13 DIAGNOSIS — F329 Major depressive disorder, single episode, unspecified: Secondary | ICD-10-CM

## 2011-08-13 DIAGNOSIS — B20 Human immunodeficiency virus [HIV] disease: Secondary | ICD-10-CM

## 2011-08-13 MED ORDER — FLUOXETINE HCL 10 MG PO CAPS: 10.0000 mg | ORAL_CAPSULE | Freq: Every day | ORAL | Status: DC

## 2011-08-13 MED ORDER — EFAVIRENZ-EMTRICITAB-TENOFOVIR 600-200-300 MG PO TABS: 1.0000 | ORAL_TABLET | Freq: Every day | ORAL | Status: DC

## 2011-08-13 NOTE — Telephone Encounter (Signed)
I left him a message that 2 of his meds have been approved & are going to be mailed to him. Asked that he call if he has any questions

## 2011-08-15 ENCOUNTER — Other Ambulatory Visit: Payer: Self-pay | Admitting: *Deleted

## 2011-08-15 DIAGNOSIS — F32A Depression, unspecified: Secondary | ICD-10-CM

## 2011-08-15 DIAGNOSIS — F329 Major depressive disorder, single episode, unspecified: Secondary | ICD-10-CM

## 2011-08-15 MED ORDER — FLUOXETINE HCL 10 MG PO CAPS: 10.0000 mg | ORAL_CAPSULE | Freq: Every day | ORAL | Status: DC

## 2011-08-25 ENCOUNTER — Telehealth: Payer: Self-pay | Admitting: Infectious Diseases

## 2011-08-25 ENCOUNTER — Telehealth: Payer: Self-pay | Admitting: *Deleted

## 2011-08-25 DIAGNOSIS — I10 Essential (primary) hypertension: Secondary | ICD-10-CM

## 2011-08-25 DIAGNOSIS — E7800 Pure hypercholesterolemia, unspecified: Secondary | ICD-10-CM

## 2011-08-25 DIAGNOSIS — F32A Depression, unspecified: Secondary | ICD-10-CM

## 2011-08-25 DIAGNOSIS — B20 Human immunodeficiency virus [HIV] disease: Secondary | ICD-10-CM

## 2011-08-25 DIAGNOSIS — F329 Major depressive disorder, single episode, unspecified: Secondary | ICD-10-CM

## 2011-08-25 MED ORDER — METOPROLOL SUCCINATE ER 50 MG PO TB24: 50.0000 mg | ORAL_TABLET | Freq: Every day | ORAL | Status: DC

## 2011-08-25 MED ORDER — EFAVIRENZ-EMTRICITAB-TENOFOVIR 600-200-300 MG PO TABS: 1.0000 | ORAL_TABLET | Freq: Every day | ORAL | Status: DC

## 2011-08-25 MED ORDER — SIMVASTATIN 80 MG PO TABS: 120.0000 mg | ORAL_TABLET | Freq: Every day | ORAL | Status: DC

## 2011-08-25 MED ORDER — FLUOXETINE HCL 10 MG PO CAPS: 10.0000 mg | ORAL_CAPSULE | Freq: Every day | ORAL | Status: DC

## 2011-08-25 NOTE — Telephone Encounter (Signed)
He is asking for viagra refill. To pam jones to see if still getting thru PAP

## 2011-08-25 NOTE — Telephone Encounter (Signed)
Faxed and mailed application to [employer] for Mr. Maina's Nitrostat today.

## 2011-08-25 NOTE — Telephone Encounter (Signed)
Called [facility] and reordered Viagra today.  Should receive within 7 - 10 days.

## 2011-08-28 ENCOUNTER — Telehealth: Payer: Self-pay | Admitting: Infectious Diseases

## 2011-08-28 NOTE — Telephone Encounter (Signed)
Checked status on Mr. Scull application for nitroGLYCERIN.  It has been approved.  It should ship within the next 7-10 days.

## 2011-09-01 ENCOUNTER — Ambulatory Visit: Payer: Self-pay

## 2011-09-01 ENCOUNTER — Telehealth: Payer: Self-pay | Admitting: *Deleted

## 2011-09-01 NOTE — Telephone Encounter (Signed)
Called patient to inform him that his PAP medications are here and ready to be picked up. He advised he will be here as soon as he can. Nitrostat 0.4 mg [id number] [id number] EXP-08/15  Viagra 50 mg [id number] [id number] EXP-03/09/16

## 2011-09-03 ENCOUNTER — Ambulatory Visit: Payer: Self-pay

## 2011-09-22 ENCOUNTER — Other Ambulatory Visit: Payer: Self-pay | Admitting: Licensed Clinical Social Worker

## 2011-09-22 DIAGNOSIS — Z7689 Persons encountering health services in other specified circumstances: Secondary | ICD-10-CM

## 2011-09-23 ENCOUNTER — Encounter: Payer: Self-pay | Admitting: *Deleted

## 2011-09-23 NOTE — Progress Notes (Signed)
Patient ID: [name] Smith, male   DOB: [birth year], 51 y.o.   MRN: [id number]  Pt has referral for PCP. He is requesting [facility] at [place] [phone]). Pt has GCCN card. [place] at [facility] will only take new patient referrals if sent through [facility]. Sent information to [place] at [facility]. Tacey Heap RN

## 2011-09-29 NOTE — Progress Notes (Signed)
Patient ID: [name] Smith, male   DOB: June 04, 1960, 51 y.o.   MRN: [id number]  [facility] @ [place] [phone]) to see if pt had appt. He had one for 09/29/11 but pt called and cancelled the appointment with no rescheduling. Tacey Heap RN

## 2011-10-14 ENCOUNTER — Ambulatory Visit (INDEPENDENT_AMBULATORY_CARE_PROVIDER_SITE_OTHER): Payer: Medicaid Other | Admitting: Cardiovascular Disease

## 2011-10-14 ENCOUNTER — Encounter: Payer: Self-pay | Admitting: Cardiovascular Disease

## 2011-10-14 VITALS — BP 142/79 | HR 89 | Wt 157.0 lb

## 2011-10-14 DIAGNOSIS — E7800 Pure hypercholesterolemia, unspecified: Secondary | ICD-10-CM

## 2011-10-14 DIAGNOSIS — Z79899 Other long term (current) drug therapy: Secondary | ICD-10-CM

## 2011-10-14 DIAGNOSIS — R0989 Other specified symptoms and signs involving the circulatory and respiratory systems: Secondary | ICD-10-CM

## 2011-10-14 DIAGNOSIS — F17200 Nicotine dependence, unspecified, uncomplicated: Secondary | ICD-10-CM

## 2011-10-14 DIAGNOSIS — B20 Human immunodeficiency virus [HIV] disease: Secondary | ICD-10-CM

## 2011-10-14 DIAGNOSIS — R011 Cardiac murmur, unspecified: Secondary | ICD-10-CM

## 2011-10-14 DIAGNOSIS — I2510 Atherosclerotic heart disease of native coronary artery without angina pectoris: Secondary | ICD-10-CM

## 2011-10-14 DIAGNOSIS — I10 Essential (primary) hypertension: Secondary | ICD-10-CM

## 2011-10-14 NOTE — Progress Notes (Signed)
Patient ID: [name] Smith, male   DOB: 01/20/1961, 51 y.o.   MRN: [id number] [name] Smith is seen today for F/U of dyspnea, CAD S/P CABG x2 With redo 10/11. EF has been mildly reduced in the 45% range. No clinical history of CHF. No arryhthmia and no indication for AICD. Still smoking. Clinical COPD. Recent virus and still wheezing. Did not want to spend money on inhaler. Called pulmonary to get him a Xopenex sample. No SSCP, dyspnea from smoking. NO palpitatons or edema. Lots of stress regarding work and possible disability application. HIV stable and F/U with Dr Maurice March. LDL still 144 on high dose zocor which interacts with atripla and his antiretroviral Rx the least Zocor increased by Dr Maurice March 4/13 to 120 mg daily.    LFTls  Normal 3/13  Needs CXR  ROS: Denies fever, malais, weight loss, blurry vision, decreased visual acuity, cough, sputum, SOB, hemoptysis, pleuritic pain, palpitaitons, heartburn, abdominal pain, melena, lower extremity edema, claudication, or rash.  All other systems reviewed and negative  General: Affect appropriate Healthy:  appears stated age HEENT: normal Neck supple with no adenopathy JVP normal bilateral bruits no thyromegaly Lungs clear with no wheezing and good diaphragmatic motion Heart:  S1/S2 MR murmur, no rub, gallop or click PMI normal Abdomen: benighn, BS positve, no tenderness, no AAA no bruit.  No HSM or HJR Distal pulses intact with no bruits No edema Neuro non-focal Skin warm and dry No muscular weakness   Current Outpatient Prescriptions  Medication Sig Dispense Refill  . ALPRAZolam (XANAX) 0.25 MG tablet Take 1 tablet (0.25 mg total) by mouth at bedtime as needed.  30 tablet  4  . aspirin 325 MG EC tablet Take 325 mg by mouth daily.        . clopidogrel (PLAVIX) 75 MG tablet Take 1 tablet (75 mg total) by mouth daily.  180 tablet  1  . efavirenz-emtrictabine-tenofovir (ATRIPLA) 600-200-300 MG per tablet Take 1 tablet by mouth daily.  90 tablet  3  .  FLUoxetine (PROZAC) 10 MG capsule Take 1 capsule (10 mg total) by mouth daily.  90 capsule  3  . folic acid (FOLVITE) 1 MG tablet TAKE 1 TABLET BY MOUTH EVERY DAY  30 tablet  11  . FOLIC ACID PO Take 1 tablet by mouth daily.        Marland Kitchen HYDROcodone-acetaminophen (VICODIN) 5-500 MG per tablet Take 1 tablet by mouth every 6 (six) hours as needed.  120 tablet  2  . metoprolol succinate (TOPROL-XL) 50 MG 24 hr tablet Take 1 tablet (50 mg total) by mouth daily.  90 tablet  3  . nicotine (NICODERM CQ - DOSED IN MG/24 HOURS) 21 mg/24hr patch Place 1 patch onto the skin as directed.        . nitroGLYCERIN (NITROSTAT) 0.4 MG SL tablet Place 1 tablet (0.4 mg total) under the tongue every 5 (five) minutes as needed. For chest pain -- may repeat times three  25 tablet  11  . sildenafil (VIAGRA) 50 MG tablet Take 1 tablet (50 mg total) by mouth daily as needed.  30 tablet  0  . simvastatin (ZOCOR) 80 MG tablet Take 1.5 tablets (120 mg total) by mouth at bedtime.  45 tablet  6    Allergies  Review of patient's allergies indicates no known allergies.  Electrocardiogram:  NSR rate 89  LAE LVH   Assessment and Plan

## 2011-10-14 NOTE — Patient Instructions (Addendum)
Your physician wants you to follow-up in: 6  MONTHS WITH DR Haywood Filler will receive a reminder letter in the mail two months in advance. If you don't receive a letter, please call our office to schedule the follow-up appointment. Your physician has requested that you have an echocardiogram. Echocardiography is a painless test that uses sound waves to create images of your heart. It provides your doctor with information about the size and shape of your heart and how well your heart's chambers and valves are working. This procedure takes approximately one hour. There are no restrictions for this procedure. DX MURMUR Your physician has requested that you have a carotid duplex. This test is an ultrasound of the carotid arteries in your neck. It looks at blood flow through these arteries that supply the brain with blood. Allow one hour for this exam. There are no restrictions or special instructions. DX BRUIT Your physician recommends that you return for lab work in: IN 1 MONTH LIVER  DX V58.69

## 2011-10-14 NOTE — Assessment & Plan Note (Signed)
AS and MR murmurs.  Echo to assess this and EF

## 2011-10-14 NOTE — Assessment & Plan Note (Signed)
No disease on duplex 2011  F/U duplex given known vascular disease and ongoing smoking ASA

## 2011-10-14 NOTE — Assessment & Plan Note (Signed)
Stable with no angina and good activity level.  Continue medical Rx  

## 2011-10-14 NOTE — Assessment & Plan Note (Signed)
Counseled for less than 10 minutes and no motivation to quit

## 2011-10-14 NOTE — Assessment & Plan Note (Signed)
F/U LFTsl in September  Continue high dose zocor per Dr Maurice March

## 2011-10-14 NOTE — Assessment & Plan Note (Signed)
F/U Dr Maurice March. Continue atripla.  CD 4 counts fine

## 2011-10-14 NOTE — Assessment & Plan Note (Signed)
Well controlled.  Continue current medications and low sodium Dash type diet.    

## 2011-10-20 ENCOUNTER — Telehealth: Payer: Self-pay | Admitting: Infectious Diseases

## 2011-10-20 ENCOUNTER — Encounter (INDEPENDENT_AMBULATORY_CARE_PROVIDER_SITE_OTHER): Payer: Medicaid Other

## 2011-10-20 DIAGNOSIS — I6529 Occlusion and stenosis of unspecified carotid artery: Secondary | ICD-10-CM

## 2011-10-20 DIAGNOSIS — R0989 Other specified symptoms and signs involving the circulatory and respiratory systems: Secondary | ICD-10-CM

## 2011-10-20 NOTE — Telephone Encounter (Signed)
Received call from patient wanting a refill of his Viagra and questioning shipment of his Plavix.  I call [facility] Outreach about his Plavix.  He needs to make another $20 co-pay for another 90 day supply.  The wording on the application is very misleading.  It looks like  $20 for 180 days.  I called [name] Smith and left a v/m for him to return my call concerning his medication.

## 2011-10-21 ENCOUNTER — Ambulatory Visit (HOSPITAL_COMMUNITY): Payer: Medicaid Other | Attending: Cardiovascular Disease

## 2011-10-21 ENCOUNTER — Telehealth: Payer: Self-pay | Admitting: Cardiovascular Disease

## 2011-10-21 DIAGNOSIS — I517 Cardiomegaly: Secondary | ICD-10-CM | POA: Insufficient documentation

## 2011-10-21 DIAGNOSIS — F17200 Nicotine dependence, unspecified, uncomplicated: Secondary | ICD-10-CM | POA: Insufficient documentation

## 2011-10-21 DIAGNOSIS — I10 Essential (primary) hypertension: Secondary | ICD-10-CM | POA: Insufficient documentation

## 2011-10-21 DIAGNOSIS — J449 Chronic obstructive pulmonary disease, unspecified: Secondary | ICD-10-CM | POA: Insufficient documentation

## 2011-10-21 DIAGNOSIS — J4489 Other specified chronic obstructive pulmonary disease: Secondary | ICD-10-CM | POA: Insufficient documentation

## 2011-10-21 DIAGNOSIS — B20 Human immunodeficiency virus [HIV] disease: Secondary | ICD-10-CM | POA: Insufficient documentation

## 2011-10-21 DIAGNOSIS — I2510 Atherosclerotic heart disease of native coronary artery without angina pectoris: Secondary | ICD-10-CM | POA: Insufficient documentation

## 2011-10-21 DIAGNOSIS — R0609 Other forms of dyspnea: Secondary | ICD-10-CM | POA: Insufficient documentation

## 2011-10-21 DIAGNOSIS — I359 Nonrheumatic aortic valve disorder, unspecified: Secondary | ICD-10-CM | POA: Insufficient documentation

## 2011-10-21 DIAGNOSIS — E785 Hyperlipidemia, unspecified: Secondary | ICD-10-CM | POA: Insufficient documentation

## 2011-10-21 DIAGNOSIS — R011 Cardiac murmur, unspecified: Secondary | ICD-10-CM | POA: Insufficient documentation

## 2011-10-21 DIAGNOSIS — R0989 Other specified symptoms and signs involving the circulatory and respiratory systems: Secondary | ICD-10-CM | POA: Insufficient documentation

## 2011-10-21 NOTE — Telephone Encounter (Signed)
PT AWARE OF ECHO RESULTS./CY 

## 2011-10-21 NOTE — Progress Notes (Signed)
Echocardiogram performed.  

## 2011-10-21 NOTE — Telephone Encounter (Signed)
New Problem:    Patient called returning your call about his ECHO results.  Please call back.

## 2011-10-22 ENCOUNTER — Telehealth: Payer: Self-pay | Admitting: Cardiovascular Disease

## 2011-10-22 NOTE — Telephone Encounter (Signed)
PT CALLING RE TEST RESULTS OF CAROTID DOPPLER, PLS CALL [phone]

## 2011-10-27 NOTE — Telephone Encounter (Signed)
LMTCB ./CY 

## 2011-10-28 ENCOUNTER — Telehealth: Payer: Self-pay | Admitting: Infectious Diseases

## 2011-10-28 NOTE — Telephone Encounter (Signed)
Received v/m from [name] Smith wanting to know if his Viagra had arrived yet. I told him I did not see any notes stating it had arrived.  I called [facility] and checked the status of the order.  It is in process now and should ship within the next 5-7 days.  I called [name] back and gave him the status.  I told him we will call him as soon as the medication is received.

## 2011-10-31 ENCOUNTER — Telehealth: Payer: Self-pay | Admitting: *Deleted

## 2011-10-31 NOTE — Telephone Encounter (Signed)
Notified patient his PA medication, Viagra has arrived and is ready for pick up.  #30 Exp. 05/07/13, lot #[id number] Wendall Mola CMA

## 2011-11-06 ENCOUNTER — Telehealth: Payer: Self-pay | Admitting: *Deleted

## 2011-11-06 NOTE — Telephone Encounter (Signed)
ERROR./CY 

## 2011-11-06 NOTE — Telephone Encounter (Signed)
PT AWARE OF CAROTID RESULTS  MILD DISEASE F/U  CAROTID IN 2 YEARS .[name] Smith

## 2011-11-18 ENCOUNTER — Other Ambulatory Visit: Payer: Self-pay | Admitting: Infectious Diseases

## 2011-11-18 DIAGNOSIS — R52 Pain, unspecified: Secondary | ICD-10-CM

## 2011-11-19 ENCOUNTER — Other Ambulatory Visit: Payer: Medicaid Other

## 2011-11-24 ENCOUNTER — Other Ambulatory Visit (INDEPENDENT_AMBULATORY_CARE_PROVIDER_SITE_OTHER): Payer: Medicaid Other

## 2011-11-24 DIAGNOSIS — Z79899 Other long term (current) drug therapy: Secondary | ICD-10-CM

## 2011-11-24 LAB — HEPATIC FUNCTION PANEL
ALT: 27 U/L (ref 0–53)
AST: 32 U/L (ref 0–37)
Albumin: 3.8 g/dL (ref 3.5–5.2)
Alkaline Phosphatase: 64 U/L (ref 39–117)
Bilirubin, Direct: 0.1 mg/dL (ref 0.0–0.3)
Total Bilirubin: 1.1 mg/dL (ref 0.3–1.2)
Total Protein: 7.1 g/dL (ref 6.0–8.3)

## 2011-11-25 ENCOUNTER — Other Ambulatory Visit: Payer: Self-pay | Admitting: Infectious Diseases

## 2011-11-25 DIAGNOSIS — Z113 Encounter for screening for infections with a predominantly sexual mode of transmission: Secondary | ICD-10-CM

## 2011-11-25 DIAGNOSIS — Z79899 Other long term (current) drug therapy: Secondary | ICD-10-CM

## 2011-11-25 DIAGNOSIS — B20 Human immunodeficiency virus [HIV] disease: Secondary | ICD-10-CM

## 2011-12-01 ENCOUNTER — Other Ambulatory Visit (HOSPITAL_COMMUNITY)
Admission: RE | Admit: 2011-12-01 | Discharge: 2011-12-01 | Disposition: A | Discharge status: Home / Self Care | Payer: Self-pay | Source: Ambulatory Visit | Attending: Infectious Diseases | Admitting: Infectious Diseases

## 2011-12-01 ENCOUNTER — Other Ambulatory Visit: Payer: Self-pay

## 2011-12-01 ENCOUNTER — Ambulatory Visit: Payer: Self-pay

## 2011-12-01 DIAGNOSIS — Z79899 Other long term (current) drug therapy: Secondary | ICD-10-CM

## 2011-12-01 DIAGNOSIS — B20 Human immunodeficiency virus [HIV] disease: Secondary | ICD-10-CM

## 2011-12-01 DIAGNOSIS — Z113 Encounter for screening for infections with a predominantly sexual mode of transmission: Secondary | ICD-10-CM | POA: Insufficient documentation

## 2011-12-01 LAB — CBC WITH DIFFERENTIAL/PLATELET
Basophils Absolute: 0 10*3/uL (ref 0.0–0.1)
Basophils Relative: 0 % (ref 0–1)
Eosinophils Absolute: 0.1 10*3/uL (ref 0.0–0.7)
Eosinophils Relative: 1 % (ref 0–5)
HCT: 49.9 % (ref 39.0–52.0)
Hemoglobin: 17.7 g/dL — ABNORMAL HIGH (ref 13.0–17.0)
Lymphocytes Relative: 31 % (ref 12–46)
Lymphs Abs: 2 10*3/uL (ref 0.7–4.0)
MCH: 35.3 pg — ABNORMAL HIGH (ref 26.0–34.0)
MCHC: 35.5 g/dL (ref 30.0–36.0)
MCV: 99.6 fL (ref 78.0–100.0)
Monocytes Absolute: 0.5 10*3/uL (ref 0.1–1.0)
Monocytes Relative: 8 % (ref 3–12)
Neutro Abs: 3.8 10*3/uL (ref 1.7–7.7)
Neutrophils Relative %: 60 % (ref 43–77)
Platelets: 256 10*3/uL (ref 150–400)
RBC: 5.01 MIL/uL (ref 4.22–5.81)
RDW: 12.4 % (ref 11.5–15.5)
WBC: 6.3 10*3/uL (ref 4.0–10.5)

## 2011-12-01 LAB — LIPID PANEL
Cholesterol: 237 mg/dL — ABNORMAL HIGH (ref 0–200)
HDL: 66 mg/dL (ref >39)
LDL Cholesterol: 145 mg/dL — ABNORMAL HIGH (ref 0–99)
Total CHOL/HDL Ratio: 3.6 Ratio
Triglycerides: 131 mg/dL (ref <150)
VLDL: 26 mg/dL (ref 0–40)

## 2011-12-01 LAB — COMPREHENSIVE METABOLIC PANEL
ALT: 33 U/L (ref 0–53)
AST: 36 U/L (ref 0–37)
Albumin: 4.5 g/dL (ref 3.5–5.2)
Alkaline Phosphatase: 63 U/L (ref 39–117)
BUN: 15 mg/dL (ref 6–23)
CO2: 28 mEq/L (ref 19–32)
Calcium: 9.8 mg/dL (ref 8.4–10.5)
Chloride: 103 mEq/L (ref 96–112)
Creat: 0.96 mg/dL (ref 0.50–1.35)
Glucose, Bld: 86 mg/dL (ref 70–99)
Potassium: 5 mEq/L (ref 3.5–5.3)
Sodium: 140 mEq/L (ref 135–145)
Total Bilirubin: 0.6 mg/dL (ref 0.3–1.2)
Total Protein: 7.5 g/dL (ref 6.0–8.3)

## 2011-12-01 LAB — RPR

## 2011-12-02 LAB — HIV-1 RNA QUANT-NO REFLEX-BLD
HIV 1 RNA Quant: <20 copies/mL (ref <20)
HIV-1 RNA Quant, Log: <1.3 {Log} (ref <1.30)

## 2011-12-02 LAB — T-HELPER CELL (CD4) - (RCID CLINIC ONLY)
CD4 % Helper T Cell: 36 % (ref 33–55)
CD4 T Cell Abs: 750 uL (ref 400–2700)

## 2011-12-03 ENCOUNTER — Other Ambulatory Visit: Payer: Self-pay

## 2011-12-05 ENCOUNTER — Other Ambulatory Visit: Payer: Self-pay | Admitting: *Deleted

## 2011-12-05 DIAGNOSIS — Z716 Tobacco abuse counseling: Secondary | ICD-10-CM

## 2011-12-05 DIAGNOSIS — Z720 Tobacco use: Secondary | ICD-10-CM

## 2011-12-05 MED ORDER — NICOTINE 10 MG IN INHA: 1.0000 | RESPIRATORY_TRACT | Status: DC | PRN

## 2011-12-10 ENCOUNTER — Telehealth: Payer: Self-pay | Admitting: Infectious Diseases

## 2011-12-10 NOTE — Telephone Encounter (Signed)
Faxed Mr. Duell application to [employer] for Nicotrol today.

## 2011-12-19 ENCOUNTER — Ambulatory Visit (INDEPENDENT_AMBULATORY_CARE_PROVIDER_SITE_OTHER): Payer: Self-pay | Admitting: Infectious Diseases

## 2011-12-19 ENCOUNTER — Encounter: Payer: Self-pay | Admitting: Infectious Diseases

## 2011-12-19 VITALS — BP 139/84 | HR 94 | Temp 98.5°F | Wt 156.0 lb

## 2011-12-19 DIAGNOSIS — B20 Human immunodeficiency virus [HIV] disease: Secondary | ICD-10-CM

## 2011-12-19 DIAGNOSIS — R52 Pain, unspecified: Secondary | ICD-10-CM

## 2011-12-19 MED ORDER — HYDROCODONE-ACETAMINOPHEN 5-500 MG PO TABS: 1.0000 | ORAL_TABLET | Freq: Four times a day (QID) | ORAL | Status: DC | PRN

## 2011-12-19 NOTE — Progress Notes (Signed)
Patient ID: [name] Smith, male   DOB: January 15, 1961, 51 y.o.   MRN: [id number] HIV f/u Visit  [name] Smith has been mostly adherent to his Atripla and has HIV 42 and stable CD4 570. Reemphasized importance of consistent adherence to his Atripla. His CV disease is stable and without chest pain or limitations. His cholesterol and LDL coul be better (230'd/140's respectively) and may need more than 80mg  of Zocor daily. I also wonder about adherence to his statin but will leave this to his cardiologist. [name] Smith has had no fevers, rashes, or weight loss. BP 139/84  Pulse 94  Temp 98.5 F (36.9 C) (Oral)  Wt 156 lb (70.761 kg) [name] Smith looks well and his skin is nearly always red from his tanning both visits. It gives him a flushed appearance. No adenopathy. Lungs clear and no murmurs or rubs.  Plans and Assessment 1.HIV reasonably stable and emphasize as always high level adherence to Atripla. Overall he is doing well. 2.Tobacco use-unfortunately ongoing and is trying nicotine inhaler. Reemphasized reasons for smoking cessation. 3.CAD disease stable 4.Hyperlipidemia may not be adhering to statin Plan RTC 4 months BP 139/84  Pulse 94  Temp 98.5 F (36.9 C) (Oral)  Wt 156 lb (70.761 kg)

## 2011-12-19 NOTE — Patient Instructions (Signed)
Continue medication therapy and return to clinic in 4 months

## 2011-12-23 ENCOUNTER — Telehealth: Payer: Self-pay | Admitting: *Deleted

## 2011-12-23 NOTE — Telephone Encounter (Signed)
Called patient and notified that his Nicotrol has arrived through patient assistance. Wendall Mola CMA

## 2012-01-29 ENCOUNTER — Other Ambulatory Visit: Payer: Self-pay | Admitting: *Deleted

## 2012-01-29 NOTE — Telephone Encounter (Signed)
Pt has not responded to calls from [facility] because he didn't recognize their telephone number.  RN advised the pt to call [facility] to arrange delivery.  Pt given phone number to call.  Pt verbalized understanding.

## 2012-02-16 ENCOUNTER — Telehealth: Payer: Self-pay | Admitting: Infectious Diseases

## 2012-02-16 NOTE — Telephone Encounter (Signed)
Called and re-ordered [name] Smith Viagra.  It will automatically order on 02-29-12 and ship within 3-5 business days.

## 2012-02-20 ENCOUNTER — Other Ambulatory Visit: Payer: Self-pay | Admitting: Licensed Clinical Social Worker

## 2012-02-20 DIAGNOSIS — R52 Pain, unspecified: Secondary | ICD-10-CM

## 2012-02-20 MED ORDER — HYDROCODONE-ACETAMINOPHEN 5-500 MG PO TABS: 1.0000 | ORAL_TABLET | Freq: Four times a day (QID) | ORAL | Status: DC | PRN

## 2012-03-16 ENCOUNTER — Telehealth: Payer: Self-pay | Admitting: *Deleted

## 2012-03-16 NOTE — Telephone Encounter (Signed)
Received fax for Vicodin 5/500 dated 12/19/11 asking to change the dosage to 5/325 from [facility]. It was denied because in the mean time we phoned in a refill for the patient to [facility] for 02/20/12. I will ask the doctor about the change but I will not authorize any refills until I do so as he should still have some at [facility].

## 2012-03-17 ENCOUNTER — Other Ambulatory Visit: Payer: Self-pay | Admitting: *Deleted

## 2012-03-17 NOTE — Telephone Encounter (Signed)
Patient called and notified that his Viagra was in the office and ready for pickup.  ZOX:[id number], Exp: 09/06/2016. Andree Coss, RN

## 2012-03-19 ENCOUNTER — Telehealth: Payer: Self-pay | Admitting: Cardiovascular Disease

## 2012-03-19 NOTE — Telephone Encounter (Signed)
New problem:   Need to stop plavix today . Prior to colonoscopy on 1/20 Dr. Madilyn Fireman.  Will need a call today

## 2012-03-19 NOTE — Telephone Encounter (Signed)
Will forward to [place].  Looks like pt is not on Plavix.  Courtney at [facility] says the pt claims he is on plavix.  They faxed a form that need to be completed and sent back.

## 2012-03-23 MED ORDER — CLOPIDOGREL BISULFATE 75 MG PO TABS: 75.0000 mg | ORAL_TABLET | Freq: Every day | ORAL | Status: DC

## 2012-03-23 NOTE — Addendum Note (Signed)
Addended by: Scherrie Bateman E on: 03/23/2012 11:34 AM   Modules accepted: Orders

## 2012-03-24 NOTE — Telephone Encounter (Signed)
Form completed and faxed. 

## 2012-03-24 NOTE — Telephone Encounter (Signed)
Ok to stop plavix

## 2012-03-29 ENCOUNTER — Encounter (HOSPITAL_COMMUNITY): Admission: RE | Payer: Self-pay | Source: Ambulatory Visit

## 2012-03-29 ENCOUNTER — Ambulatory Visit (HOSPITAL_COMMUNITY): Admission: RE | Admit: 2012-03-29 | Payer: Self-pay | Source: Ambulatory Visit | Admitting: Gastroenterology

## 2012-03-29 SURGERY — COLONOSCOPY
Anesthesia: Moderate Sedation

## 2012-04-20 ENCOUNTER — Ambulatory Visit (HOSPITAL_COMMUNITY): Admission: RE | Admit: 2012-04-20 | Payer: Self-pay | Source: Ambulatory Visit | Admitting: Gastroenterology

## 2012-04-20 ENCOUNTER — Encounter (HOSPITAL_COMMUNITY): Admission: RE | Payer: Self-pay | Source: Ambulatory Visit

## 2012-04-20 SURGERY — COLONOSCOPY
Anesthesia: Moderate Sedation

## 2012-05-17 ENCOUNTER — Telehealth: Payer: Self-pay | Admitting: *Deleted

## 2012-05-17 ENCOUNTER — Other Ambulatory Visit (INDEPENDENT_AMBULATORY_CARE_PROVIDER_SITE_OTHER): Payer: PRIVATE HEALTH INSURANCE

## 2012-05-17 DIAGNOSIS — B20 Human immunodeficiency virus [HIV] disease: Secondary | ICD-10-CM

## 2012-05-17 NOTE — Telephone Encounter (Signed)
Patient here for his regular lab work. Would like [place] to refill his Plavix 75 mg.  Per records, cardiologist office phoned in 1 year supply to [facility] pharmacy 03/23/12, but they have no record of it at the pharmacy.   Andree Coss, RN

## 2012-05-18 LAB — COMPLETE METABOLIC PANEL WITH GFR
ALT: 21 U/L (ref 0–53)
AST: 20 U/L (ref 0–37)
Albumin: 3.9 g/dL (ref 3.5–5.2)
Alkaline Phosphatase: 66 U/L (ref 39–117)
BUN: 17 mg/dL (ref 6–23)
CO2: 28 mEq/L (ref 19–32)
Calcium: 9.3 mg/dL (ref 8.4–10.5)
Chloride: 105 mEq/L (ref 96–112)
Creat: 0.85 mg/dL (ref 0.50–1.35)
GFR, Est African American: >89 mL/min
GFR, Est Non African American: >89 mL/min
Glucose, Bld: 76 mg/dL (ref 70–99)
Potassium: 5.6 mEq/L — ABNORMAL HIGH (ref 3.5–5.3)
Sodium: 140 mEq/L (ref 135–145)
Total Bilirubin: 0.6 mg/dL (ref 0.3–1.2)
Total Protein: 6.9 g/dL (ref 6.0–8.3)

## 2012-05-18 LAB — CBC WITH DIFFERENTIAL/PLATELET
Basophils Absolute: 0 10*3/uL (ref 0.0–0.1)
Basophils Relative: 0 % (ref 0–1)
Eosinophils Absolute: 0.1 10*3/uL (ref 0.0–0.7)
Eosinophils Relative: 1 % (ref 0–5)
HCT: 43.6 % (ref 39.0–52.0)
Hemoglobin: 15.2 g/dL (ref 13.0–17.0)
Lymphocytes Relative: 21 % (ref 12–46)
Lymphs Abs: 1.6 10*3/uL (ref 0.7–4.0)
MCH: 35.2 pg — ABNORMAL HIGH (ref 26.0–34.0)
MCHC: 34.9 g/dL (ref 30.0–36.0)
MCV: 100.9 fL — ABNORMAL HIGH (ref 78.0–100.0)
Monocytes Absolute: 0.7 10*3/uL (ref 0.1–1.0)
Monocytes Relative: 9 % (ref 3–12)
Neutro Abs: 5.3 10*3/uL (ref 1.7–7.7)
Neutrophils Relative %: 69 % (ref 43–77)
Platelets: 240 10*3/uL (ref 150–400)
RBC: 4.32 MIL/uL (ref 4.22–5.81)
RDW: 12.7 % (ref 11.5–15.5)
WBC: 7.7 10*3/uL (ref 4.0–10.5)

## 2012-05-18 LAB — HIV-1 RNA QUANT-NO REFLEX-BLD
HIV 1 RNA Quant: 64 copies/mL — ABNORMAL HIGH (ref <20)
HIV-1 RNA Quant, Log: 1.81 {Log} — ABNORMAL HIGH (ref <1.30)

## 2012-05-18 LAB — T-HELPER CELL (CD4) - (RCID CLINIC ONLY)
CD4 % Helper T Cell: 34 % (ref 33–55)
CD4 T Cell Abs: 590 uL (ref 400–2700)

## 2012-05-19 ENCOUNTER — Other Ambulatory Visit: Payer: Self-pay | Admitting: *Deleted

## 2012-05-19 ENCOUNTER — Telehealth: Payer: Self-pay | Admitting: Infectious Diseases

## 2012-05-19 DIAGNOSIS — E7800 Pure hypercholesterolemia, unspecified: Secondary | ICD-10-CM

## 2012-05-19 MED ORDER — CLOPIDOGREL BISULFATE 75 MG PO TABS: 75.0000 mg | ORAL_TABLET | Freq: Every day | ORAL | Status: DC

## 2012-05-19 NOTE — Telephone Encounter (Signed)
Patient needed Rx to go with his PAP application.

## 2012-05-19 NOTE — Telephone Encounter (Signed)
Received message from [place] that [name] Smith needs a refill of his Plavix.  I called [place] and left a v/m for him to return my call.

## 2012-05-19 NOTE — Telephone Encounter (Signed)
Faxed prescription for Plavix to [facility] today

## 2012-05-24 DIAGNOSIS — Z0279 Encounter for issue of other medical certificate: Secondary | ICD-10-CM

## 2012-05-25 ENCOUNTER — Ambulatory Visit: Payer: Self-pay

## 2012-05-26 ENCOUNTER — Telehealth: Payer: Self-pay

## 2012-05-26 NOTE — Telephone Encounter (Signed)
Pt is trying to quit smoking. He has tried Chantix without success.   He would like to try Wellbutrin since this has worked well for a friend.   Please advise.   Laurell Josephs, RN

## 2012-05-31 ENCOUNTER — Encounter (HOSPITAL_COMMUNITY): Payer: Self-pay | Admitting: *Deleted

## 2012-05-31 ENCOUNTER — Emergency Department (HOSPITAL_COMMUNITY)
Admission: EM | Admit: 2012-05-31 | Discharge: 2012-05-31 | Disposition: A | Discharge status: Home / Self Care | Payer: PRIVATE HEALTH INSURANCE | Attending: Emergency Medicine | Admitting: Emergency Medicine

## 2012-05-31 ENCOUNTER — Emergency Department (HOSPITAL_COMMUNITY): Payer: PRIVATE HEALTH INSURANCE

## 2012-05-31 DIAGNOSIS — M25571 Pain in right ankle and joints of right foot: Secondary | ICD-10-CM

## 2012-05-31 DIAGNOSIS — Z7982 Long term (current) use of aspirin: Secondary | ICD-10-CM | POA: Insufficient documentation

## 2012-05-31 DIAGNOSIS — F411 Generalized anxiety disorder: Secondary | ICD-10-CM | POA: Insufficient documentation

## 2012-05-31 DIAGNOSIS — Z87442 Personal history of urinary calculi: Secondary | ICD-10-CM | POA: Insufficient documentation

## 2012-05-31 DIAGNOSIS — E7800 Pure hypercholesterolemia, unspecified: Secondary | ICD-10-CM | POA: Insufficient documentation

## 2012-05-31 DIAGNOSIS — K219 Gastro-esophageal reflux disease without esophagitis: Secondary | ICD-10-CM | POA: Insufficient documentation

## 2012-05-31 DIAGNOSIS — I2510 Atherosclerotic heart disease of native coronary artery without angina pectoris: Secondary | ICD-10-CM | POA: Insufficient documentation

## 2012-05-31 DIAGNOSIS — I10 Essential (primary) hypertension: Secondary | ICD-10-CM | POA: Insufficient documentation

## 2012-05-31 DIAGNOSIS — Z951 Presence of aortocoronary bypass graft: Secondary | ICD-10-CM | POA: Insufficient documentation

## 2012-05-31 DIAGNOSIS — Z79899 Other long term (current) drug therapy: Secondary | ICD-10-CM | POA: Insufficient documentation

## 2012-05-31 DIAGNOSIS — Z21 Asymptomatic human immunodeficiency virus [HIV] infection status: Secondary | ICD-10-CM | POA: Insufficient documentation

## 2012-05-31 DIAGNOSIS — Z7902 Long term (current) use of antithrombotics/antiplatelets: Secondary | ICD-10-CM | POA: Insufficient documentation

## 2012-05-31 DIAGNOSIS — Z23 Encounter for immunization: Secondary | ICD-10-CM | POA: Insufficient documentation

## 2012-05-31 DIAGNOSIS — F17200 Nicotine dependence, unspecified, uncomplicated: Secondary | ICD-10-CM | POA: Insufficient documentation

## 2012-05-31 DIAGNOSIS — M25579 Pain in unspecified ankle and joints of unspecified foot: Secondary | ICD-10-CM | POA: Insufficient documentation

## 2012-05-31 MED ORDER — IBUPROFEN 600 MG PO TABS: 600.0000 mg | ORAL_TABLET | Freq: Four times a day (QID) | ORAL | Status: DC | PRN

## 2012-05-31 MED ORDER — IBUPROFEN 800 MG PO TABS
800.0000 mg | ORAL_TABLET | Freq: Once | ORAL | Status: AC
  Administered 2012-05-31: 800 mg via ORAL
  Filled 2012-05-31: qty 1

## 2012-05-31 MED ORDER — SULFAMETHOXAZOLE-TRIMETHOPRIM 800-160 MG PO TABS: 1.0000 | ORAL_TABLET | Freq: Two times a day (BID) | ORAL | Status: DC

## 2012-05-31 MED ORDER — HYDROCODONE-ACETAMINOPHEN 5-325 MG PO TABS: 1.0000 | ORAL_TABLET | Freq: Four times a day (QID) | ORAL | Status: DC | PRN

## 2012-05-31 NOTE — ED Provider Notes (Signed)
History     CSN: [id number]  Arrival date & time 05/31/12  8295   None     Chief Complaint  Patient presents with  . Ankle Pain    (Consider location/radiation/quality/duration/timing/severity/associated sxs/prior treatment) HPI Comments: This is a 52 year old male, PMH of HIV, anxiety, and HTN, who presents to the ED with a chief complaint of right ankle pain.  The patient states that she this morning around 4:00am his right ankle began throbbing.  He states that about an hour later, he got up to go to the bathroom, and had severe pain in his left ankle.  He denies any MOI.  He has not had this pain before.  He states that the pain does not radiate.  He denies chest pain, SOB, nausea, vomiting, and diarrhea.    The history is provided by the patient. No language interpreter was used.    Past Medical History  Diagnosis Date  . Hypertension   . Coronary atherosclerosis of unspecified type of vessel, native or graft   . Pure hypercholesterolemia   . Need for prophylactic vaccination and inoculation against influenza   . Human immunodeficiency virus (HIV) disease   . Calculus of kidney   . Esophageal reflux   . Anxiety state, unspecified     Past Surgical History  Procedure Laterality Date  . Coronary artery bypass graft  12/1999  . Ptca      No family history on file.  History  Substance Use Topics  . Smoking status: Current Every Day Smoker -- 0.50 packs/day    Types: Cigarettes  . Smokeless tobacco: Never Used  . Alcohol Use: 0.5 oz/week    1 drink(s) per week     Comment: wine      Review of Systems  All other systems reviewed and are negative.    Allergies  Review of patient's allergies indicates no known allergies.  Home Medications   Current Outpatient Rx  Name  Route  Sig  Dispense  Refill  . ALPRAZolam (XANAX) 0.25 MG tablet   Oral   Take 1 tablet (0.25 mg total) by mouth at bedtime as needed.   30 tablet   4   . aspirin 325 MG EC tablet  Oral   Take 325 mg by mouth daily.           . clopidogrel (PLAVIX) 75 MG tablet   Oral   Take 1 tablet (75 mg total) by mouth daily.   90 tablet   3   . efavirenz-emtrictabine-tenofovir (ATRIPLA) 600-200-300 MG per tablet   Oral   Take 1 tablet by mouth daily.   90 tablet   3   . FLUoxetine (PROZAC) 10 MG capsule   Oral   Take 1 capsule (10 mg total) by mouth daily.   90 capsule   3   . folic acid (FOLVITE) 1 MG tablet      TAKE 1 TABLET BY MOUTH EVERY DAY   30 tablet   11   . HYDROcodone-acetaminophen (VICODIN) 5-500 MG per tablet   Oral   Take 1 tablet by mouth every 6 (six) hours as needed for pain.   120 tablet   3   . metoprolol succinate (TOPROL-XL) 50 MG 24 hr tablet   Oral   Take 1 tablet (50 mg total) by mouth daily.   90 tablet   3   . nicotine (NICOTROL) 10 MG inhaler   Inhalation   Inhale 1 puff into the lungs  as needed for smoking cessation.   42 each   5   . nitroGLYCERIN (NITROSTAT) 0.4 MG SL tablet   Sublingual   Place 1 tablet (0.4 mg total) under the tongue every 5 (five) minutes as needed. For chest pain -- may repeat times three   25 tablet   11   . sildenafil (VIAGRA) 50 MG tablet   Oral   Take 1 tablet (50 mg total) by mouth daily as needed.   30 tablet   0     Lot # [id number], exp date 12/08/2015   . simvastatin (ZOCOR) 80 MG tablet   Oral   Take 1.5 tablets (120 mg total) by mouth at bedtime.   45 tablet   6     BP 126/77  Pulse 106  Temp(Src) 0 F (-17.8 C)  Resp 20  SpO2 97%  Physical Exam  Nursing note and vitals reviewed. Constitutional: He is oriented to person, place, and time. He appears well-developed and well-nourished.  HENT:  Head: Normocephalic and atraumatic.  Eyes: Conjunctivae and EOM are normal.  Neck: Normal range of motion.  Cardiovascular: Normal rate.   Intact distal pulses and brisk capillary refill  Pulmonary/Chest: Effort normal.  Abdominal: He exhibits no distension.  Musculoskeletal:  Normal range of motion.  Right ankle painful to palpation, ROM is 5/5, strength is limited secondary to pain  Neurological: He is alert and oriented to person, place, and time.  Sensation is intact  Skin: Skin is dry.  Bilateral warmth and redness to the ankles, however no swelling  Psychiatric: He has a normal mood and affect. His behavior is normal. Judgment and thought content normal.    ED Course  Procedures (including critical care time)  Results for orders placed in visit on 05/17/12  HIV 1 RNA QUANT-NO REFLEX-BLD      Result Value Range   HIV 1 RNA Quant 64 (*) <20 copies/mL   HIV1 RNA Quant, Log 1.81 (*) <1.30 log 10  CBC WITH DIFFERENTIAL      Result Value Range   WBC 7.7  4.0 - 10.5 K/uL   RBC 4.32  4.22 - 5.81 MIL/uL   Hemoglobin 15.2  13.0 - 17.0 g/dL   HCT 96.0  45.4 - 09.8 %   MCV 100.9 (*) 78.0 - 100.0 fL   MCH 35.2 (*) 26.0 - 34.0 pg   MCHC 34.9  30.0 - 36.0 g/dL   RDW 11.9  14.7 - 82.9 %   Platelets 240  150 - 400 K/uL   Neutrophils Relative 69  43 - 77 %   Neutro Abs 5.3  1.7 - 7.7 K/uL   Lymphocytes Relative 21  12 - 46 %   Lymphs Abs 1.6  0.7 - 4.0 K/uL   Monocytes Relative 9  3 - 12 %   Monocytes Absolute 0.7  0.1 - 1.0 K/uL   Eosinophils Relative 1  0 - 5 %   Eosinophils Absolute 0.1  0.0 - 0.7 K/uL   Basophils Relative 0  0 - 1 %   Basophils Absolute 0.0  0.0 - 0.1 K/uL   Smear Review Criteria for review not met    COMPLETE METABOLIC PANEL WITH GFR      Result Value Range   Sodium 140  135 - 145 mEq/L   Potassium 5.6 (*) 3.5 - 5.3 mEq/L   Chloride 105  96 - 112 mEq/L   CO2 28  19 - 32 mEq/L   Glucose, Bld 76  70 - 99 mg/dL   BUN 17  6 - 23 mg/dL   Creat 5.78  4.69 - 6.29 mg/dL   Total Bilirubin 0.6  0.3 - 1.2 mg/dL   Alkaline Phosphatase 66  39 - 117 U/L   AST 20  0 - 37 U/L   ALT 21  0 - 53 U/L   Total Protein 6.9  6.0 - 8.3 g/dL   Albumin 3.9  3.5 - 5.2 g/dL   Calcium 9.3  8.4 - 52.8 mg/dL   GFR, Est African American >89     GFR, Est  Non African American >89    T-HELPER CELL (CD4)      Result Value Range   CD4 T Cell Abs 590  400 - 2700 cmm   CD4 % Helper T Cell 34  33 - 55 %   Dg Ankle Complete Right  05/31/2012  *RADIOLOGY REPORT*  Clinical Data: ankle injury.  Pain.  Soft tissue swelling.  RIGHT ANKLE - COMPLETE 3+ VIEW  Comparison: None.  Findings: Three-view exam shows no fracture.  No subluxation or dislocation.  Surgical clips are seen in the medial soft tissues of the distal leg.  IMPRESSION: No acute bony findings.   Original Report Authenticated By: Kennith Center, M.D.       1. Ankle pain, right       MDM  This is a 52 year male with ankle pain.  Denies any MOI.  The redness and warmth to bilateral ankles can be attributed to the patient's recent tanning bed exposure.  Additionally, the patient has applied icy-hot to this ankles.  Will order plain films, and will re-examine the patient.  Patient drove himself here.  Ibuprofen for pain here.  Consider cellulitis vs gout.   7:47 AM Patient seen by and discussed with Dr. Patria Mane, who agrees that it could be gout, but that we should cover for cellulitis too, as the patient has some defined redness.  Will treat with Bactrim, ibuprofen, and Norco for break through pain.       Roxy Horseman, PA-C 05/31/12 [phone]

## 2012-05-31 NOTE — ED Notes (Signed)
Pt states woke up this morning with right ankle pain; no known injury

## 2012-05-31 NOTE — ED Provider Notes (Signed)
Medical screening examination/treatment/procedure(s) were conducted as a shared visit with non-physician practitioner(s) and myself.  I personally evaluated the patient during the encounter  May represent gout vs early cellulitis. Home with pain medicine and abx. Close pcp follow up  Lyanne Co, MD 05/31/12 [phone]

## 2012-06-04 ENCOUNTER — Ambulatory Visit (INDEPENDENT_AMBULATORY_CARE_PROVIDER_SITE_OTHER): Payer: PRIVATE HEALTH INSURANCE | Admitting: Infectious Diseases

## 2012-06-04 ENCOUNTER — Encounter: Payer: Self-pay | Admitting: Infectious Diseases

## 2012-06-04 VITALS — BP 136/72 | HR 89 | Temp 97.9°F | Wt 151.0 lb

## 2012-06-04 DIAGNOSIS — F17200 Nicotine dependence, unspecified, uncomplicated: Secondary | ICD-10-CM

## 2012-06-04 DIAGNOSIS — B20 Human immunodeficiency virus [HIV] disease: Secondary | ICD-10-CM

## 2012-06-04 MED ORDER — BUPROPION HCL 100 MG PO TABS: 100.0000 mg | ORAL_TABLET | Freq: Every day | ORAL | Status: AC

## 2012-06-04 NOTE — Progress Notes (Signed)
Patient ID: [name] Smith, male   DOB: Feb 23, 1961, 52 y.o.   MRN: [id number] HIV f/u   [name] Smith is doing reasonably well but is still smoke l- l.5 PPD. He is desperate to quit and wants to try Welbutrin since other modalities have failed. Shared with him that nothing works too well if he doesn't have high commitment to quit. His HIV is supressed but will change to coformulated ARV regimen of Atripla. He will begin this as soon as his current ARV supply is exhausted. BP 136/72  Pulse 89  Temp(Src) 97.9 F (36.6 C) (Oral)  Wt 151 lb (68.493 kg)  BMI 22.29 kg/m2   Chest clear No murmur. No adenopathy. I&P HIV suppressed but for convenience and better (?) will change to Atripla. Continue other chronic medications. Smoking- will refer for counseling and give three month trial of Welbutrin. NB Since I will be retiring after this June '14 [name] will come under the care of my partner Dr. Domenic Moras

## 2012-06-04 NOTE — Patient Instructions (Signed)
Keep up the good work

## 2012-06-13 ENCOUNTER — Other Ambulatory Visit: Payer: Self-pay | Admitting: Infectious Diseases

## 2012-06-14 ENCOUNTER — Telehealth: Payer: Self-pay | Admitting: Infectious Diseases

## 2012-06-14 NOTE — Telephone Encounter (Signed)
Ordered Colleen's Viagra today.  Should arrive within 7-10 days.

## 2012-06-18 IMAGING — CR DG CHEST 2V
2 series · 2 of 2 positions shown · non-contrast
Comparison: 11/06/2008 and earlier.

CLINICAL DATA: 48-year-old male chest pain.  Remote history of open
heart surgery.

CHEST - 2 VIEW

[w chest pa]
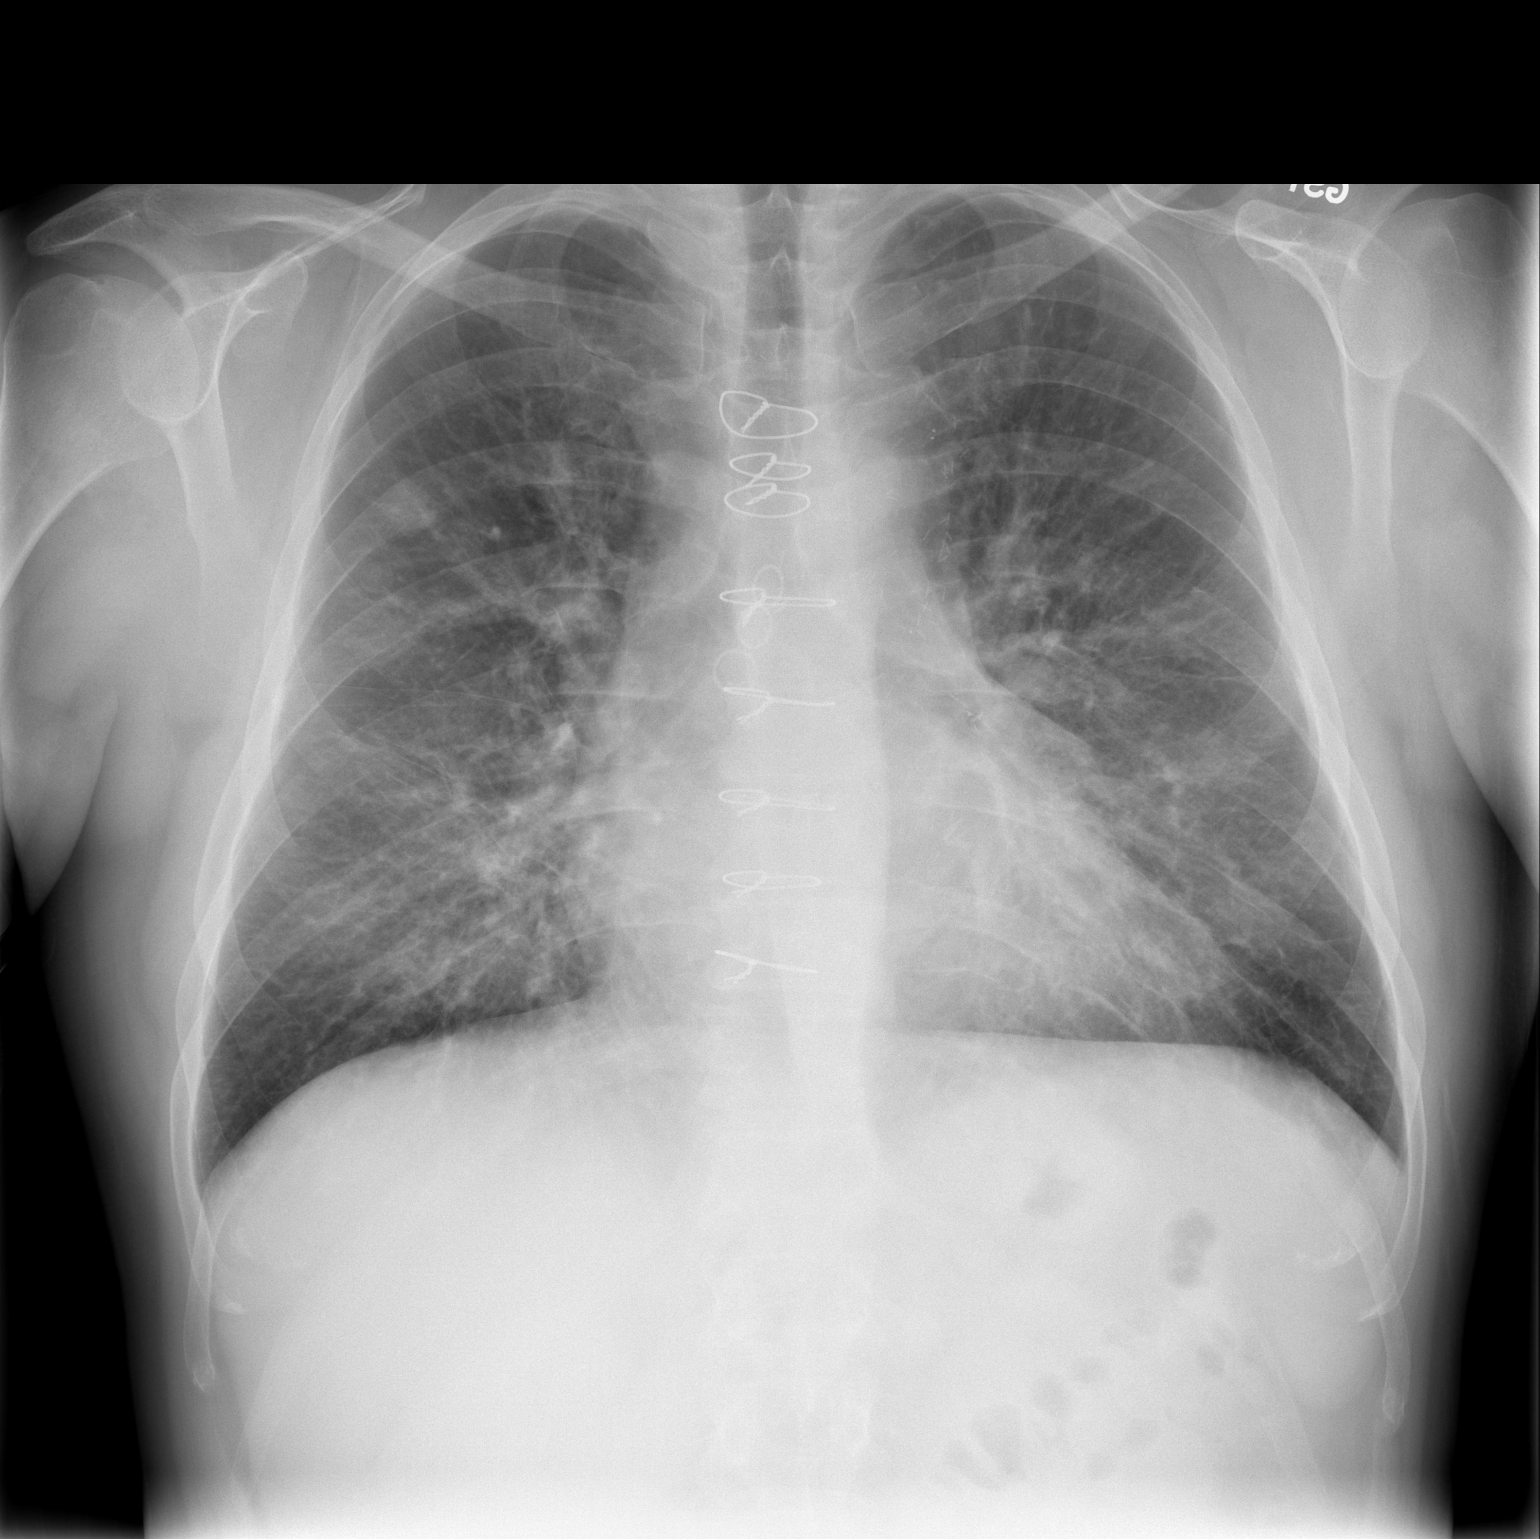

[w chest lat]
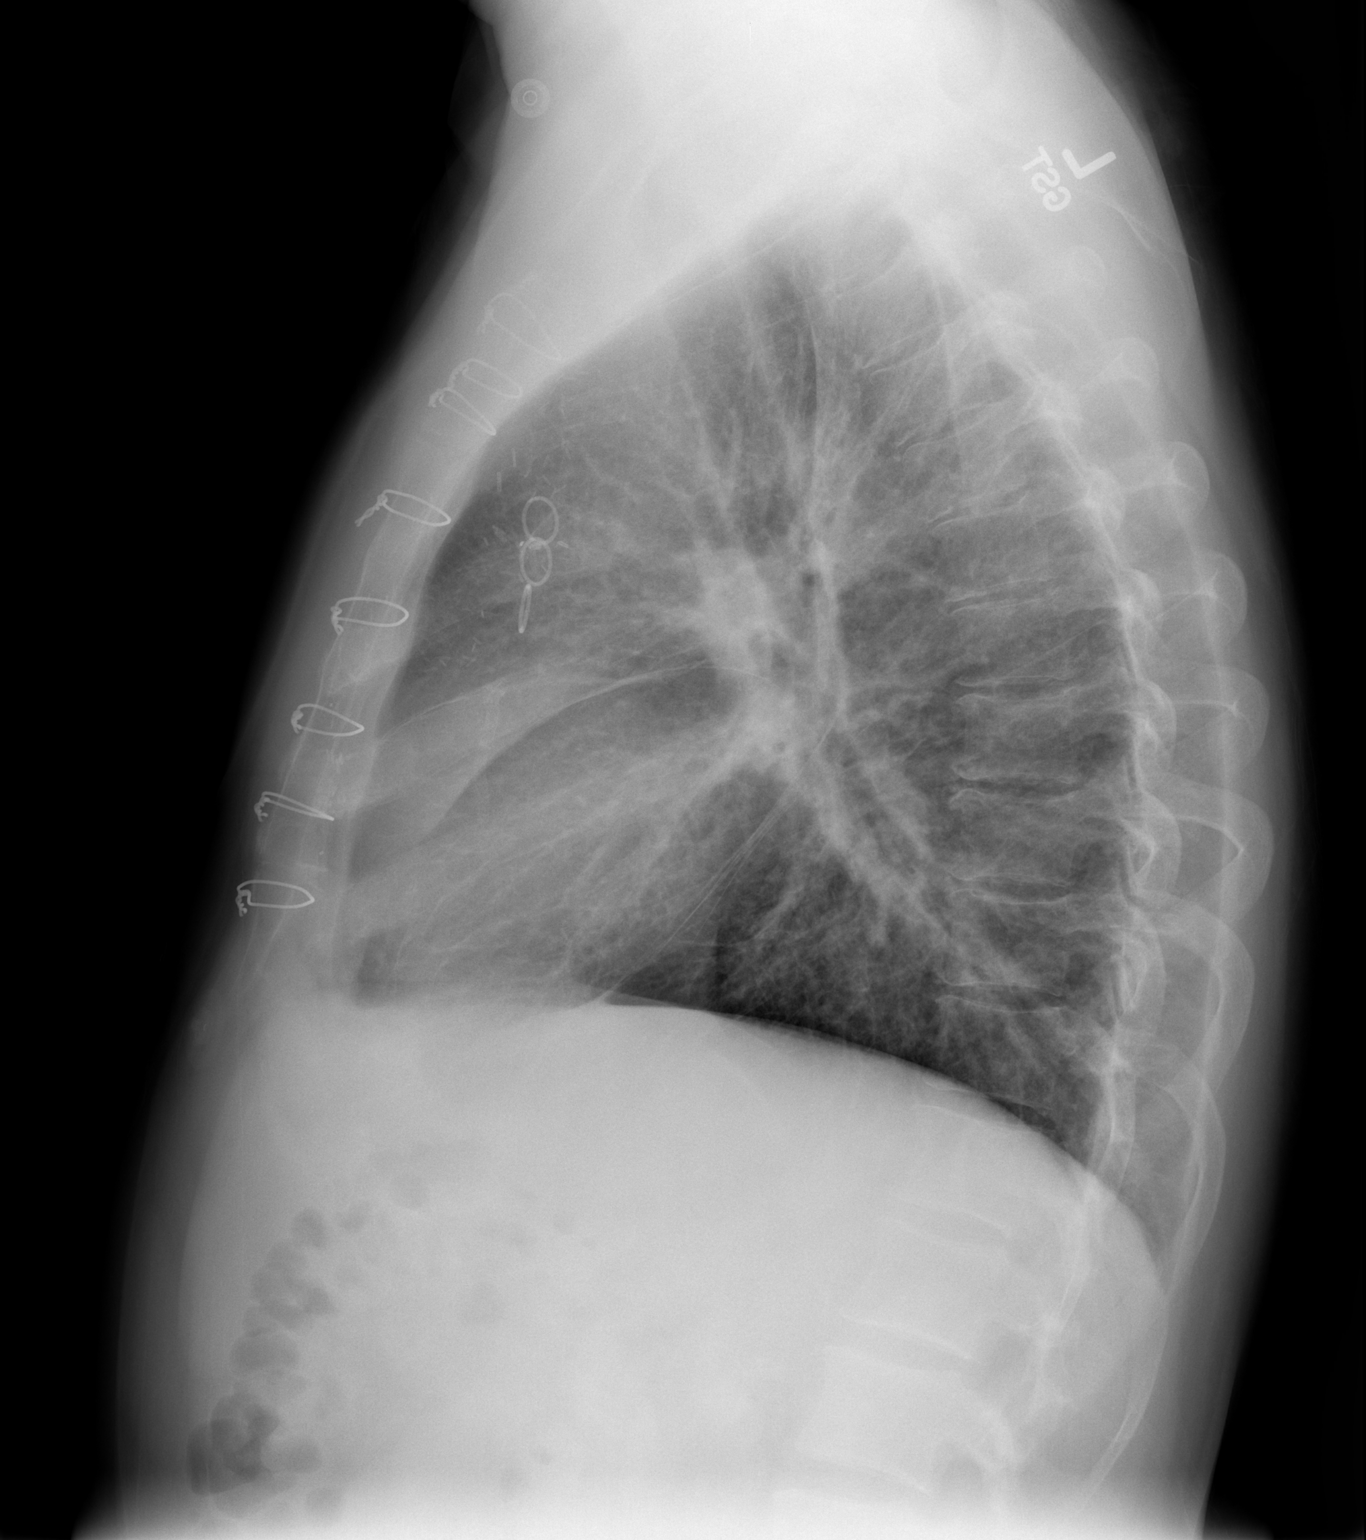

[2 of 2 positions shown; findings below may reference images not displayed]

FINDINGS: Stable sequelae of CABG.  Cardiac size and mediastinal
contours are within normal limits.  Diffuse increased interstitial
opacity, perihilar predominant.  No pneumothorax.  Trace fluid in
the pleural fissures, otherwise no pleural effusion.  No
consolidation or pneumothorax. Visualized tracheal air column is
within normal limits.  No acute osseous abnormality identified.
IMPRESSION: Appearance most suggestive of acute pulmonary interstitial edema.

## 2012-06-20 IMAGING — CR DG CHEST 2V
2 series · 2 of 2 positions shown · non-contrast
Comparison: 12/19/2009.  11/23/2004.

CLINICAL DATA: Myocardial infarction.

CHEST - 2 VIEW

[w chest pa]
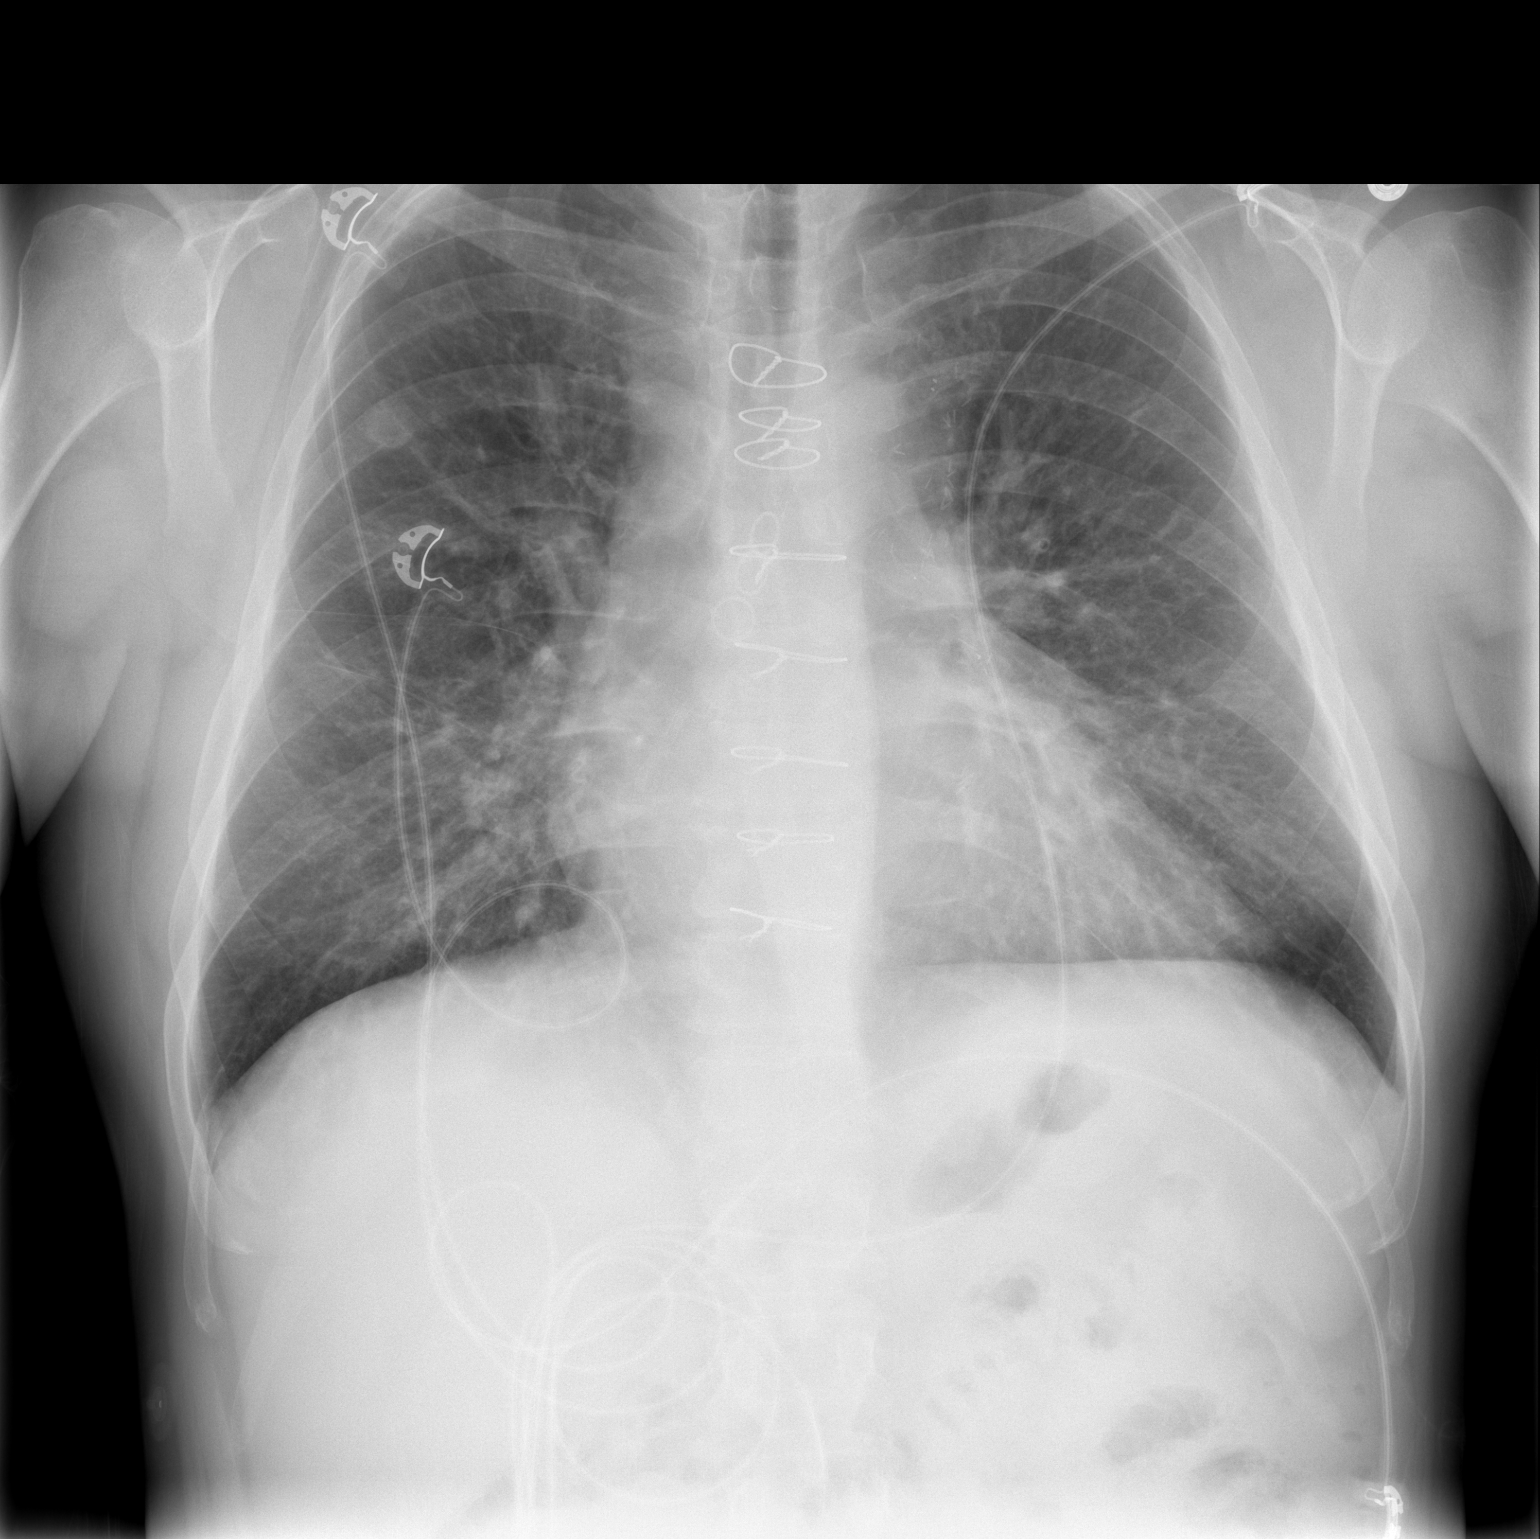

[w chest lat]
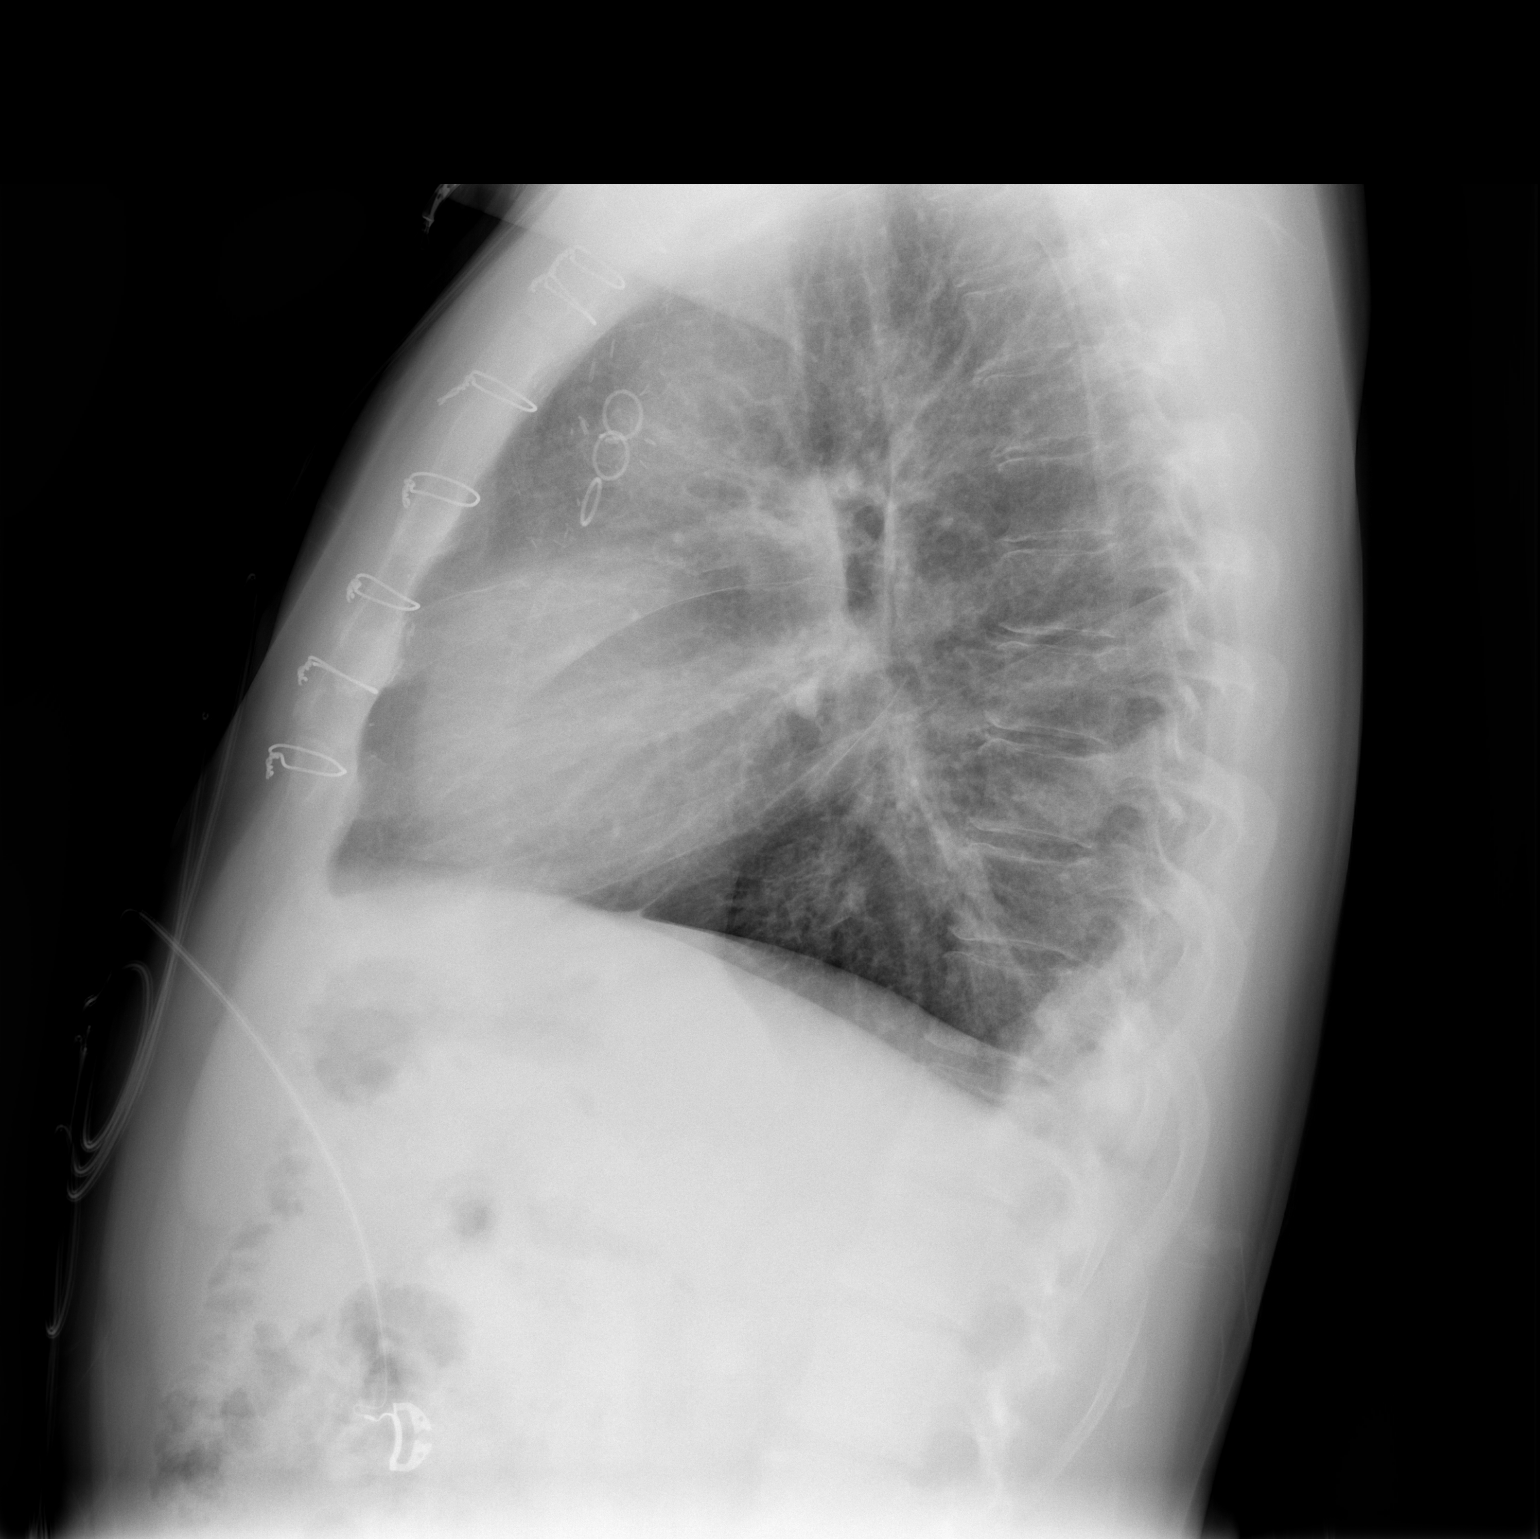

[2 of 2 positions shown; findings below may reference images not displayed]

FINDINGS: Pulmonary edema with small pleural effusions best
appreciated on the lateral view.  Status post CABG.  Cardiomegaly.
Prominent mediastinum unchanged.

Nodular appearance projecting over the anterior right first rib.
This appears different than on remote exams and underlying nodule
cannot be excluded.  Attention to this on follow-up.  If this
persists then CT may be considered.
IMPRESSION: Pulmonary edema with small pleural effusions best appreciated on
the lateral view.  Status post CABG.

Nodular appearance projecting over the anterior right first rib.
This appears different than on remote exams and underlying nodule
cannot be excluded.  Attention to this on follow-up.  If this
persists then CT may be considered.

This is a call report.

## 2012-06-27 IMAGING — CR DG CHEST 1V PORT
1 series · 1 of 1 positions shown · non-contrast
Comparison: 12/21/2009

CLINICAL DATA: Postop CABG.

PORTABLE CHEST - 1 VIEW

[AP]
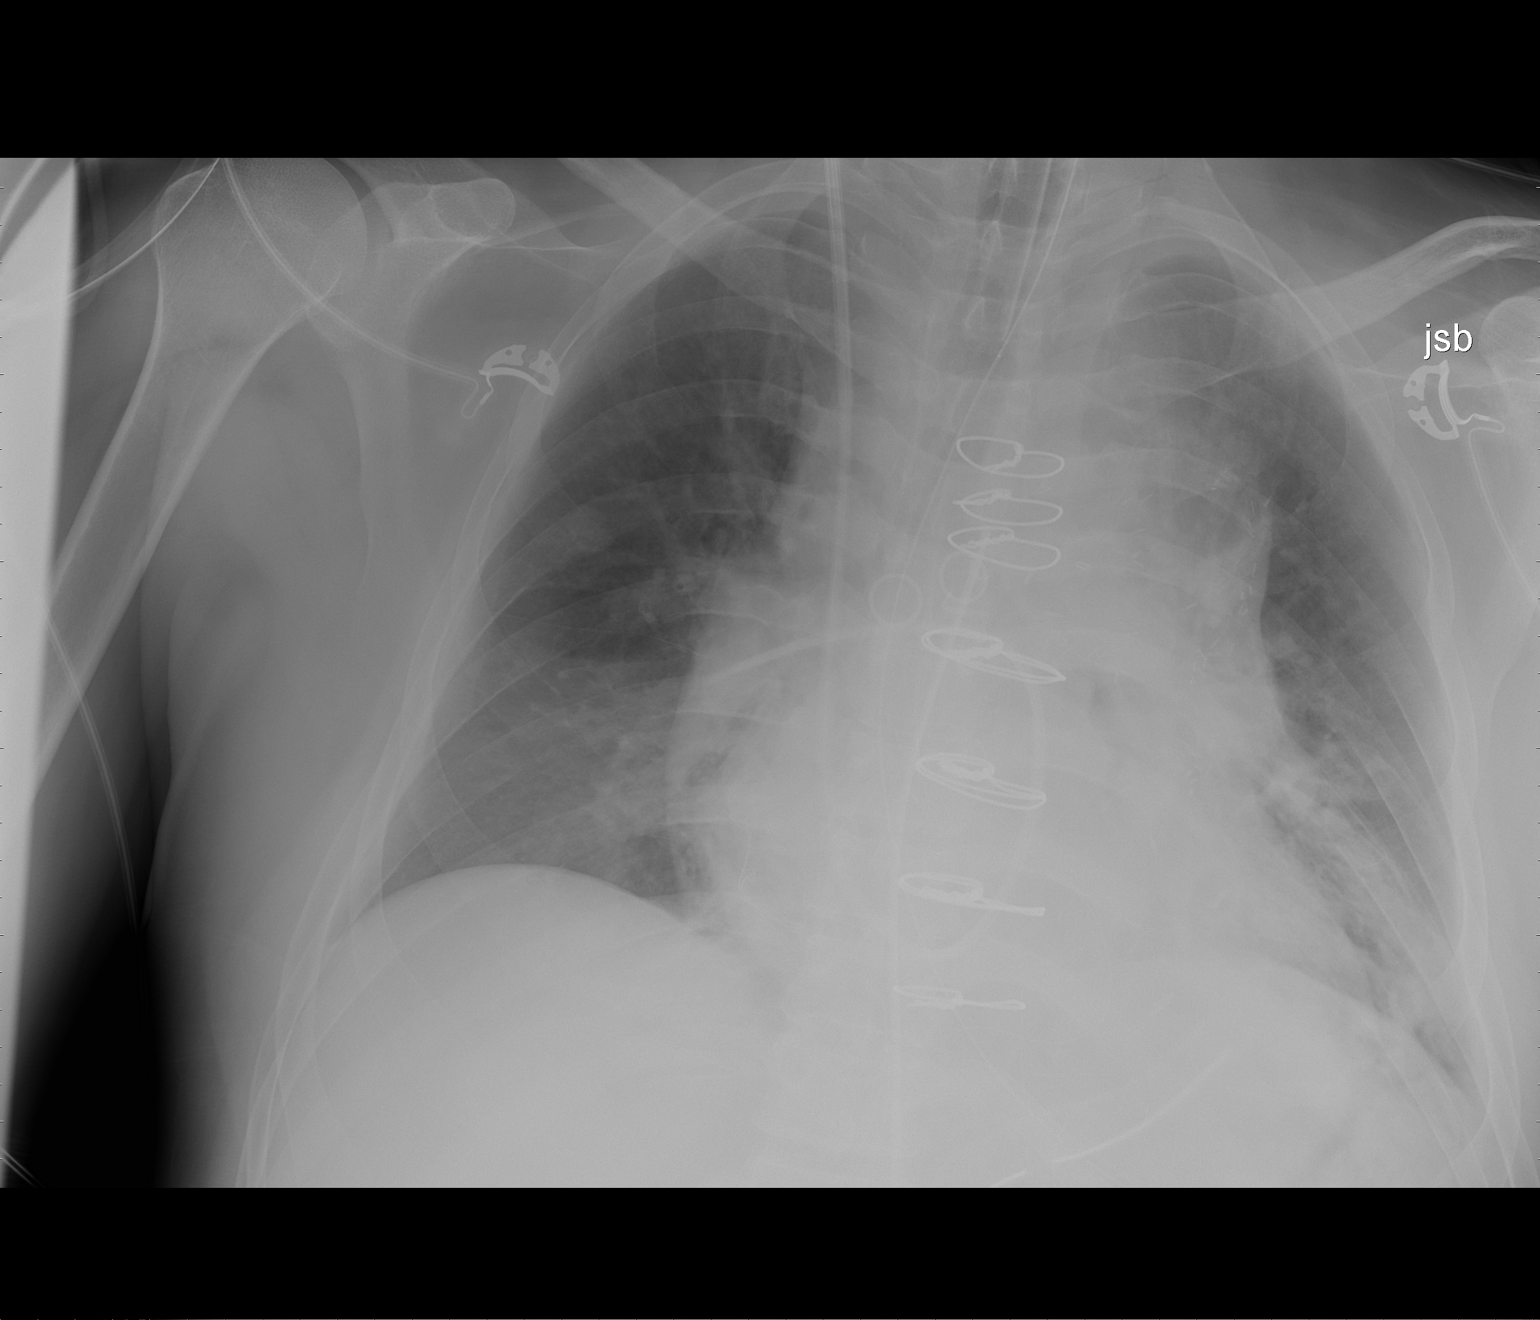

[1 of 1 positions shown; findings below may reference images not displayed]

FINDINGS: Changes of CABG noted.  Endotracheal tube is
approximately 7 cm above the carina.  Swan-Ganz catheter is in the
right descending pulmonary artery.  NG tube is in the stomach.
Left basilar chest tube noted.  No pneumothorax.

There is left infrahilar and lower lobe opacity, likely
atelectasis.  Heart is enlarged.  No focal opacity on the right.
IMPRESSION: Postoperative changes with support devices as above.

Left lower lobe atelectasis.

No pneumothorax.

## 2012-07-21 ENCOUNTER — Other Ambulatory Visit: Payer: Self-pay | Admitting: *Deleted

## 2012-07-21 DIAGNOSIS — I10 Essential (primary) hypertension: Secondary | ICD-10-CM

## 2012-07-21 DIAGNOSIS — F32A Depression, unspecified: Secondary | ICD-10-CM

## 2012-07-21 DIAGNOSIS — B20 Human immunodeficiency virus [HIV] disease: Secondary | ICD-10-CM

## 2012-07-21 DIAGNOSIS — F329 Major depressive disorder, single episode, unspecified: Secondary | ICD-10-CM

## 2012-07-21 MED ORDER — EFAVIRENZ-EMTRICITAB-TENOFOVIR 600-200-300 MG PO TABS: 1.0000 | ORAL_TABLET | Freq: Every day | ORAL | Status: DC

## 2012-07-29 ENCOUNTER — Telehealth: Payer: Self-pay | Admitting: *Deleted

## 2012-07-29 DIAGNOSIS — R52 Pain, unspecified: Secondary | ICD-10-CM

## 2012-07-29 MED ORDER — HYDROCODONE-ACETAMINOPHEN 5-325 MG PO TABS: 1.0000 | ORAL_TABLET | Freq: Four times a day (QID) | ORAL | Status: DC | PRN

## 2012-07-29 NOTE — Telephone Encounter (Signed)
Patient called to refill his Vicodin 5-500. Last refilled 12/13 with 3 refills.  Changed to 5-325 with 0 refills, called into [facility].  Next appointment will be with 09/07/12 with Dr. Drue Second, as he is changing from Dr. Maurice March. Andree Coss, RN

## 2012-08-23 ENCOUNTER — Other Ambulatory Visit (INDEPENDENT_AMBULATORY_CARE_PROVIDER_SITE_OTHER): Payer: Self-pay

## 2012-08-23 DIAGNOSIS — B20 Human immunodeficiency virus [HIV] disease: Secondary | ICD-10-CM

## 2012-08-23 LAB — CBC WITH DIFFERENTIAL/PLATELET
Basophils Absolute: 0 10*3/uL (ref 0.0–0.1)
Basophils Relative: 0 % (ref 0–1)
Eosinophils Absolute: 0 10*3/uL (ref 0.0–0.7)
Eosinophils Relative: 1 % (ref 0–5)
HCT: 48.7 % (ref 39.0–52.0)
Hemoglobin: 17.2 g/dL — ABNORMAL HIGH (ref 13.0–17.0)
Lymphocytes Relative: 32 % (ref 12–46)
Lymphs Abs: 1.5 10*3/uL (ref 0.7–4.0)
MCH: 34.7 pg — ABNORMAL HIGH (ref 26.0–34.0)
MCHC: 35.3 g/dL (ref 30.0–36.0)
MCV: 98.2 fL (ref 78.0–100.0)
Monocytes Absolute: 0.3 10*3/uL (ref 0.1–1.0)
Monocytes Relative: 7 % (ref 3–12)
Neutro Abs: 3 10*3/uL (ref 1.7–7.7)
Neutrophils Relative %: 60 % (ref 43–77)
Platelets: 237 10*3/uL (ref 150–400)
RBC: 4.96 MIL/uL (ref 4.22–5.81)
RDW: 13 % (ref 11.5–15.5)
WBC: 4.9 10*3/uL (ref 4.0–10.5)

## 2012-08-23 LAB — COMPLETE METABOLIC PANEL WITH GFR
ALT: 30 U/L (ref 0–53)
AST: 26 U/L (ref 0–37)
Albumin: 4.2 g/dL (ref 3.5–5.2)
Alkaline Phosphatase: 66 U/L (ref 39–117)
BUN: 14 mg/dL (ref 6–23)
CO2: 28 mEq/L (ref 19–32)
Calcium: 9.5 mg/dL (ref 8.4–10.5)
Chloride: 105 mEq/L (ref 96–112)
Creat: 1.01 mg/dL (ref 0.50–1.35)
GFR, Est African American: >89 mL/min
GFR, Est Non African American: 86 mL/min
Glucose, Bld: 95 mg/dL (ref 70–99)
Potassium: 4.9 mEq/L (ref 3.5–5.3)
Sodium: 141 mEq/L (ref 135–145)
Total Bilirubin: 0.6 mg/dL (ref 0.3–1.2)
Total Protein: 6.9 g/dL (ref 6.0–8.3)

## 2012-08-24 ENCOUNTER — Other Ambulatory Visit: Payer: PRIVATE HEALTH INSURANCE

## 2012-08-24 LAB — T-HELPER CELL (CD4) - (RCID CLINIC ONLY)
CD4 % Helper T Cell: 43 % (ref 33–55)
CD4 T Cell Abs: 670 uL (ref 400–2700)

## 2012-08-24 LAB — HIV-1 RNA QUANT-NO REFLEX-BLD
HIV 1 RNA Quant: <20 copies/mL (ref <20)
HIV-1 RNA Quant, Log: <1.3 {Log} (ref <1.30)

## 2012-09-01 ENCOUNTER — Ambulatory Visit: Payer: Self-pay | Admitting: Infectious Diseases

## 2012-09-01 ENCOUNTER — Encounter: Payer: Self-pay | Admitting: Infectious Diseases

## 2012-09-01 ENCOUNTER — Other Ambulatory Visit: Payer: Self-pay | Admitting: *Deleted

## 2012-09-01 DIAGNOSIS — R52 Pain, unspecified: Secondary | ICD-10-CM

## 2012-09-01 MED ORDER — HYDROCODONE-ACETAMINOPHEN 5-325 MG PO TABS: 1.0000 | ORAL_TABLET | Freq: Four times a day (QID) | ORAL | Status: DC | PRN

## 2012-09-01 NOTE — Progress Notes (Signed)
Last refill per Dr. Maurice March.  Patient informed.

## 2012-09-07 ENCOUNTER — Ambulatory Visit: Payer: PRIVATE HEALTH INSURANCE | Admitting: Internal Medicine

## 2012-09-09 ENCOUNTER — Ambulatory Visit: Payer: Self-pay

## 2012-09-11 ENCOUNTER — Other Ambulatory Visit: Payer: Self-pay | Admitting: Infectious Diseases

## 2012-09-16 ENCOUNTER — Ambulatory Visit (INDEPENDENT_AMBULATORY_CARE_PROVIDER_SITE_OTHER): Payer: Self-pay | Admitting: Internal Medicine

## 2012-09-16 ENCOUNTER — Encounter: Payer: Self-pay | Admitting: Internal Medicine

## 2012-09-16 VITALS — BP 148/79 | HR 84 | Temp 97.6°F | Wt 148.0 lb

## 2012-09-16 DIAGNOSIS — F411 Generalized anxiety disorder: Secondary | ICD-10-CM

## 2012-09-16 DIAGNOSIS — B20 Human immunodeficiency virus [HIV] disease: Secondary | ICD-10-CM

## 2012-09-16 NOTE — Progress Notes (Signed)
[facility] CL[facility] NOTE  RFV: routine, new provider Subjective:    Patient ID: [name] Smith, male    DOB: [birth year], 52 y.o.   MRN: [id number]  HPI 52yo M with HIV, CD 4 count 670/VL<20 currently on atripla x 14-76yrs. Does well with adherence, no difficulty with sleep disturbances. The patient has been followed by Dr. Maurice March for several years. He reports doing well with exception to increased stressors/major losses in his life over the last 2 months. He is interested in counseling with Bernette Redbird.  No fever, chills, nightsweats. Does report "bump" on his back that < 66month. Nontender, just annoying  Current Outpatient Prescriptions on File Prior to Visit  Medication Sig Dispense Refill  . ALPRAZolam (XANAX) 0.25 MG tablet Take 1 tablet (0.25 mg total) by mouth at bedtime as needed.  30 tablet  4  . aspirin 325 MG EC tablet Take 325 mg by mouth daily.        Marland Kitchen buPROPion (WELLBUTRIN) 100 MG tablet Take 1 tablet (100 mg total) by mouth daily.  30 tablet  3  . clopidogrel (PLAVIX) 75 MG tablet Take 1 tablet (75 mg total) by mouth daily.  90 tablet  3  . efavirenz-emtricitabine-tenofovir (ATRIPLA) 600-200-300 MG per tablet Take 1 tablet by mouth daily.  90 tablet  3  . FLUoxetine (PROZAC) 10 MG capsule TAKE ONE CAPSULE BY MOUTH EVERY DAY  90 capsule  0  . folic acid (FOLVITE) 1 MG tablet TAKE 1 TABLET BY MOUTH EVERY DAY  30 tablet  11  . HYDROcodone-acetaminophen (NORCO/VICODIN) 5-325 MG per tablet Take 1 tablet by mouth every 6 (six) hours as needed for pain.  120 tablet  0  . ibuprofen (ADVIL,MOTRIN) 600 MG tablet Take 1 tablet (600 mg total) by mouth every 6 (six) hours as needed for pain.  30 tablet  0  . metoprolol succinate (TOPROL-XL) 50 MG 24 hr tablet TAKE 1 TABLET BY MOUTH EVERY DAY  90 tablet  0  . nitroGLYCERIN (NITROSTAT) 0.4 MG SL tablet Place 1 tablet (0.4 mg total) under the tongue every 5 (five) minutes as needed. For chest pain -- may repeat times three  25 tablet  11  . sildenafil  (VIAGRA) 50 MG tablet Take 1 tablet (50 mg total) by mouth daily as needed.  30 tablet  0  . simvastatin (ZOCOR) 80 MG tablet TAKE 1.5 TABLETS (120 MG TOTAL) BY MOUTH AT BEDTIME.  45 tablet  5  . nicotine (NICOTROL) 10 MG inhaler Inhale 1 puff into the lungs as needed for smoking cessation.  42 each  5   No current facility-administered medications on file prior to visit.   Active Ambulatory Problems    Diagnosis Date Noted  . HIV INFECTION 03/20/2006  . HYPERCHOLESTEROLEMIA 11/03/2008  . ANXIETY 11/03/2008  . SMOKER 11/06/2008  . HYPERTENSION 11/03/2008  . CORONARY ARTERY DISEASE 03/20/2006  . GERD 11/03/2008  . NEPHROLITHIASIS 11/03/2008  . COUGH 11/06/2008  . CORONARY ARTERY BYPASS GRAFT, THREE VESSEL, HX OF 03/20/2006  . Bruit 10/28/2010  . URI (upper respiratory infection) 04/29/2011  . Murmur 10/14/2011   Resolved Ambulatory Problems    Diagnosis Date Noted  . No Resolved Ambulatory Problems   Past Medical History  Diagnosis Date  . Hypertension   . Need for prophylactic vaccination and inoculation against influenza      Soc = smokes 1.5 ppd; his partner was recently incarcerated due to parole violation  Review of Systems 12 point ROS is negative except  for decreased mood. No thoughts of self harm    Objective:   Physical Exam BP 148/79  Pulse 84  Temp(Src) 97.6 F (36.4 C) (Oral)  Wt 148 lb (67.132 kg)  BMI 22.51 kg/m2 Physical Exam  Constitutional: He is oriented to person, place, and time. He appears well-developed and well-nourished. No distress.  HENT:  Mouth/Throat: Oropharynx is clear and moist. No oropharyngeal exudate.  Cardiovascular: Normal rate, regular rhythm and normal heart sounds. Exam reveals no gallop and no friction rub.  No murmur heard.  Pulmonary/Chest: Effort normal and breath sounds normal. No respiratory distress. He has no wheezes.  Abdominal: Soft. Bowel sounds are normal. He exhibits no distension. There is no tenderness.   Lymphadenopathy:  He has no cervical adenopathy. Back: 1.5cm soft mass nontender, no warmth, no erythema  Neurological: He is alert and oriented to person, place, and time.  Skin: Skin is warm and dry. No rash noted. No erythema.  Psychiatric: He has a normal mood and affect. His behavior is normal.       Assessment & Plan:  hiv = continue with atripla. Doing well thus far. Good virologic control  Soft tissue mass = will have him come back on 7/22 to do needle aspirate and send specimen for evaluation. Benign appearing presently.   Situational depression = has appt with kenny 7/22 at 10 am  rtc in 7/22

## 2012-09-28 ENCOUNTER — Ambulatory Visit: Payer: Self-pay | Admitting: Internal Medicine

## 2012-09-28 ENCOUNTER — Ambulatory Visit: Payer: Self-pay

## 2012-09-30 ENCOUNTER — Ambulatory Visit (INDEPENDENT_AMBULATORY_CARE_PROVIDER_SITE_OTHER): Payer: Self-pay | Admitting: Internal Medicine

## 2012-09-30 ENCOUNTER — Encounter: Payer: Self-pay | Admitting: Internal Medicine

## 2012-09-30 VITALS — BP 137/69 | HR 96 | Temp 98.3°F | Wt 149.0 lb

## 2012-09-30 DIAGNOSIS — L989 Disorder of the skin and subcutaneous tissue, unspecified: Secondary | ICD-10-CM

## 2012-09-30 NOTE — Progress Notes (Signed)
[facility] NOTE  RFV: cyst on back Subjective:    Patient ID: [name] Smith, male    DOB: 06-Jun-1960, 52 y.o.   MRN: [id number]  HPI 52yo M with CAD, depression, HIV, CD 4 count 670/VL<20 currently on atripla x 14-61yrs. Does well with adherence, no difficulty with sleep disturbances. The patient has been followed by Dr. Maurice March for several years. He reports doing well with exception to increased stressors/major losses in his life over the last 2 months. He is interested in counseling with Bernette Redbird.   No fever, chills, nightsweats. Does report "bump" on his back that < 40month. Nontender, just annoying. He presents to clinic in attempts to see if we can do I x D  Currently applying for disability, under depression  Review of Systems     Objective:   Physical Exam BP 137/69  Pulse 96  Temp(Src) 98.3 F (36.8 C) (Oral)  Wt 149 lb (67.586 kg)  BMI 22.66 kg/m2 Physical Exam  Constitutional: He is oriented to person, place, and time. He appears well-developed and well-nourished. No distress.  Skin on back: 2 x 3 cm raised lesion semifluctuant near left scapula. Cleaned area with chlorhexedine. Applied local anesthetic of 1% lidocaine to aspirate lesion. No fluid was aspirated.  Will defer to surgery/derm for further management      Assessment & Plan:   Raised lesion to torso = possible lipoma. Will defer to surgery/derm for evaluation and management. Currently has orange card but will place him on waitlist since this appears benign.  Disability application = will contact with Darlina Sicilian.

## 2012-10-04 ENCOUNTER — Telehealth: Payer: Self-pay | Admitting: *Deleted

## 2012-10-04 ENCOUNTER — Other Ambulatory Visit: Payer: Self-pay | Admitting: *Deleted

## 2012-10-04 DIAGNOSIS — R52 Pain, unspecified: Secondary | ICD-10-CM

## 2012-10-04 MED ORDER — HYDROCODONE-ACETAMINOPHEN 5-325 MG PO TABS: 1.0000 | ORAL_TABLET | Freq: Three times a day (TID) | ORAL | Status: DC | PRN

## 2012-10-04 NOTE — Telephone Encounter (Signed)
Patient requested refill of vicodin, although he knew his last refill with Dr. Maurice March would be the last one.  He had been prescribed 120/month.  Per Dr. Drue Second, patient to titrate down starting this month at 90/month, then 60/month, 30/month, with no more to follow. Pharmacy made aware of plan. Patient verbalized agreement with plan. Pt offered a referral to pain clinic, but declined.  Stated he knows he needs to wean off vicodin, but has not been in the right mind set to do it.  Pt is also following up with counselor Franne Forts. Andree Coss, RN

## 2012-10-04 NOTE — Progress Notes (Signed)
Patient to titrate down from 120/month to 90/month, 60/month, 30/month, then no more. Pharmacy made aware of plan. Patient verbalized agreement with plan. Pt offered a referral to pain clinic, but declined.  Stated he knows he needs to wean off vicodin, but has not been in the right mind set to do it.  Pt is also following up with counselor Franne Forts.

## 2012-10-14 ENCOUNTER — Telehealth: Payer: Self-pay | Admitting: *Deleted

## 2012-10-14 ENCOUNTER — Ambulatory Visit: Payer: Self-pay

## 2012-10-14 DIAGNOSIS — F331 Major depressive disorder, recurrent, moderate: Secondary | ICD-10-CM

## 2012-10-14 NOTE — Progress Notes (Signed)
I met with Cutler today for the first time and he talked about his depression and all the stressors in his life.  He reports that he has had 2 people he was close to die in the past 2 months - a neighbor who he felt was like a mother to him and a cousin who was like a brother to him.  He also recently was turned down for disability.  He has a long history of heart disease, including 2 heart attacks and open heart surgery.  He reports oversleeping (14 hours/day), loss of appetite with a 12 lb weight loss in the past 6 months, poor concentration, loss of interest in things, and depressed mood (5 on a 10 pt scale).  He denies suicidal ideation.  I introduced some of the basic concepts of cognitive behavior therapy - how thoughts affect feelings, and he seemed interested in this.  Plan to meet in 2 & 1/2 weeks.

## 2012-10-14 NOTE — Telephone Encounter (Signed)
Ordered Vardaan's Viagra today.  Will take 7 - 10 days.

## 2012-10-21 ENCOUNTER — Telehealth: Payer: Self-pay | Admitting: *Deleted

## 2012-10-21 NOTE — Telephone Encounter (Signed)
Left message that the patient has medication ready for pick up at his doctor's office. Viagra #30, NDC: [id number], AVW:[id number], exp: 30 SEPT 18. Andree Coss, RN

## 2012-11-02 ENCOUNTER — Ambulatory Visit: Payer: Self-pay

## 2012-11-11 ENCOUNTER — Other Ambulatory Visit: Payer: Self-pay | Admitting: *Deleted

## 2012-11-11 DIAGNOSIS — R52 Pain, unspecified: Secondary | ICD-10-CM

## 2012-11-11 MED ORDER — HYDROCODONE-ACETAMINOPHEN 5-325 MG PO TABS: 1.0000 | ORAL_TABLET | Freq: Three times a day (TID) | ORAL | Status: DC | PRN

## 2012-11-11 NOTE — Telephone Encounter (Signed)
Per previous telephone note the pt should received #60 tablets this refill.  Next refill will be for #30 tablets.

## 2012-11-12 ENCOUNTER — Telehealth: Payer: Self-pay | Admitting: Licensed Clinical Social Worker

## 2012-11-12 ENCOUNTER — Other Ambulatory Visit: Payer: Self-pay | Admitting: *Deleted

## 2012-11-12 MED ORDER — VALACYCLOVIR HCL 1 G PO TABS: 1000.0000 mg | ORAL_TABLET | Freq: Three times a day (TID) | ORAL | Status: DC

## 2012-11-12 NOTE — Telephone Encounter (Signed)
I sent in Valtryx 1gm TID x7 days with 1 refill to his pharmacy per Dr. Drue Second.

## 2012-11-12 NOTE — Telephone Encounter (Signed)
Patient is requesting valtrex for an outbreak, he was getting it from his pcp Dr. Clelia Croft but she will not refill it until his visit next week. He would like Dr. Drue Second to write it for him if possible. It needs to go to [facility] on [address].

## 2012-11-12 NOTE — Telephone Encounter (Signed)
Thank you :)

## 2012-12-01 ENCOUNTER — Telehealth: Payer: Self-pay | Admitting: *Deleted

## 2012-12-01 NOTE — Telephone Encounter (Signed)
Received a call from Coye wanting a re-order of his Viagra.  I called [facility] and re-ordered the medication.  It will not ship until 12-18-12.  Viagra can be ordered only every 90 days.

## 2012-12-06 ENCOUNTER — Ambulatory Visit: Payer: Self-pay

## 2012-12-06 DIAGNOSIS — F314 Bipolar disorder, current episode depressed, severe, without psychotic features: Secondary | ICD-10-CM

## 2012-12-06 DIAGNOSIS — F411 Generalized anxiety disorder: Secondary | ICD-10-CM

## 2012-12-06 NOTE — Progress Notes (Signed)
[name] Smith came in today and reported again that he continues to sleep more than usual.  He also reports some anxiety.  We completed a treatment plan with a goal to improve mood and a goal to reduce anxiety.  As we talked I discovered that he had some symptoms in line with bipolar disorder and assessed him further for this.  He endorsed multiple symptoms of mania from the past including reduced need for sleep at times, distractibility, psychomotor agitation, etc, followed by periods of depression.  I told him that it appears to me that he most definitely has Bipolar Disorder.  Plan to arrange for him to be seen at [facility] for medication management and is scheduled to see me again in one month.

## 2012-12-09 ENCOUNTER — Telehealth: Payer: Self-pay | Admitting: *Deleted

## 2012-12-09 DIAGNOSIS — R52 Pain, unspecified: Secondary | ICD-10-CM

## 2012-12-09 MED ORDER — HYDROCODONE-ACETAMINOPHEN 5-325 MG PO TABS: 1.0000 | ORAL_TABLET | Freq: Three times a day (TID) | ORAL | Status: DC | PRN

## 2012-12-09 NOTE — Telephone Encounter (Signed)
Needing advise from Dr. Drue Second about # of tablets to be prescribed.  Message given to Dr. Drue Second to respond.  Pt informed.

## 2012-12-10 NOTE — Telephone Encounter (Signed)
Dr Drue Second provided and printed #30 tablet prescription.

## 2012-12-12 ENCOUNTER — Ambulatory Visit (INDEPENDENT_AMBULATORY_CARE_PROVIDER_SITE_OTHER): Payer: BC Managed Care – PPO | Admitting: Internal Medicine

## 2012-12-12 ENCOUNTER — Ambulatory Visit: Payer: BC Managed Care – PPO

## 2012-12-12 VITALS — BP 152/78 | HR 98 | Temp 98.1°F | Resp 16 | Ht 67.5 in | Wt 151.4 lb

## 2012-12-12 DIAGNOSIS — S3210XA Unspecified fracture of sacrum, initial encounter for closed fracture: Secondary | ICD-10-CM

## 2012-12-12 DIAGNOSIS — K644 Residual hemorrhoidal skin tags: Secondary | ICD-10-CM

## 2012-12-12 DIAGNOSIS — S300XXA Contusion of lower back and pelvis, initial encounter: Secondary | ICD-10-CM

## 2012-12-12 MED ORDER — TRIAMCINOLONE ACETONIDE 0.1 % EX CREA: TOPICAL_CREAM | Freq: Two times a day (BID) | CUTANEOUS | Status: DC

## 2012-12-12 MED ORDER — HYDROCODONE-ACETAMINOPHEN 5-325 MG PO TABS: 1.0000 | ORAL_TABLET | Freq: Four times a day (QID) | ORAL | Status: DC | PRN

## 2012-12-12 NOTE — Patient Instructions (Addendum)
Hemorrhoids Hemorrhoids are swollen veins around the rectum or anus. There are two types of hemorrhoids:   Internal hemorrhoids. These occur in the veins just inside the rectum. They may poke through to the outside and become irritated and painful.  External hemorrhoids. These occur in the veins outside the anus and can be felt as a painful swelling or hard lump near the anus. CAUSES  Pregnancy.   Obesity.   Constipation or diarrhea.   Straining to have a bowel movement.   Sitting for long periods on the toilet.  Heavy lifting or other activity that caused you to strain.  Anal intercourse. SYMPTOMS   Pain.   Anal itching or irritation.   Rectal bleeding.   Fecal leakage.   Anal swelling.   One or more lumps around the anus.  DIAGNOSIS  Your caregiver may be able to diagnose hemorrhoids by visual examination. Other examinations or tests that may be performed include:   Examination of the rectal area with a gloved hand (digital rectal exam).   Examination of anal canal using a small tube (scope).   A blood test if you have lost a significant amount of blood.  A test to look inside the colon (sigmoidoscopy or colonoscopy). TREATMENT Most hemorrhoids can be treated at home. However, if symptoms do not seem to be getting better or if you have a lot of rectal bleeding, your caregiver may perform a procedure to help make the hemorrhoids get smaller or remove them completely. Possible treatments include:   Placing a rubber band at the base of the hemorrhoid to cut off the circulation (rubber band ligation).   Injecting a chemical to shrink the hemorrhoid (sclerotherapy).   Using a tool to burn the hemorrhoid (infrared light therapy).   Surgically removing the hemorrhoid (hemorrhoidectomy).   Stapling the hemorrhoid to block blood flow to the tissue (hemorrhoid stapling).  HOME CARE INSTRUCTIONS   Eat foods with fiber, such as whole grains, beans,  nuts, fruits, and vegetables. Ask your doctor about taking products with added fiber in them (fibersupplements).  Increase fluid intake. Drink enough water and fluids to keep your urine clear or pale yellow.   Exercise regularly.   Go to the bathroom when you have the urge to have a bowel movement. Do not wait.   Avoid straining to have bowel movements.   Keep the anal area dry and clean. Use wet toilet paper or moist towelettes after a bowel movement.   Medicated creams and suppositories may be used or applied as directed.   Only take over-the-counter or prescription medicines as directed by your caregiver.   Take warm sitz baths for 15 20 minutes, 3 4 times a day to ease pain and discomfort.   Place ice packs on the hemorrhoids if they are tender and swollen. Using ice packs between sitz baths may be helpful.   Put ice in a plastic bag.   Place a towel between your skin and the bag.   Leave the ice on for 15 20 minutes, 3 4 times a day.   Do not use a donut-shaped pillow or sit on the toilet for long periods. This increases blood pooling and pain.  SEEK MEDICAL CARE IF:  You have increasing pain and swelling that is not controlled by treatment or medicine.  You have uncontrolled bleeding.  You have difficulty or you are unable to have a bowel movement.  You have pain or inflammation outside the area of the hemorrhoids. MAKE SURE YOU:    Understand these instructions.  Will watch your condition.  Will get help right away if you are not doing well or get worse. Document Released: 02/22/2000 Document Revised: 02/11/2012 Document Reviewed: 12/30/2011 [facility] Patient Information 2014 [place], [place]. Tailbone Injury The tailbone (coccyx) is the small bone at the lower end of the spine. A tailbone injury may involve stretched ligaments, bruising, or a broken bone (fracture). Women are more vulnerable to this injury due to having a wider pelvis. CAUSES  This  type of injury typically occurs from falling and landing on the tailbone. Repeated strain or friction from actions such as rowing and bicycling may also injure the area. The tailbone can be injured during childbirth. Infections or tumors may also press on the tailbone and cause pain. Sometimes, the cause of injury is unknown. SYMPTOMS  Bruising. Pain when sitting. Painful bowel movements. In women, pain during intercourse. DIAGNOSIS  Your caregiver can diagnose a tailbone injury based on your symptoms and a physical exam. X-rays may be taken if a fracture is suspected. Your caregiver may also use an MRI scan imaging test to evaluate your symptoms. TREATMENT  Your caregiver may prescribe medicines to help relieve your pain. Most tailbone injuries heal on their own in 4 to 6 weeks. However, if the injury is caused by an infection or tumor, the recovery period may vary. PREVENTION  Wear appropriate padding and sports gear when bicycling and rowing. This can help prevent an injury from repeated strain or friction. HOME CARE INSTRUCTIONS  Put ice on the injured area. Put ice in a plastic bag. Place a towel between your skin and the bag. Leave the ice on for 15-20 minutes, every hour while awake for the first 1 to 2 days. Sit on a large, rubber or inflated ring or cushion to ease your pain. Lean forward when sitting to help decrease discomfort. Avoid sitting for long periods of time. Increase your activity as the pain allows. Only take over-the-counter or prescription medicines for pain, discomfort, or fever as directed by your caregiver. You may use stool softeners if it is painful to have a bowel movement, or as directed by your caregiver. Eat a diet with plenty of fiber to help prevent constipation. Keep all follow-up appointments as directed by your caregiver. SEEK MEDICAL CARE IF:  Your pain becomes worse. Your bowel movements cause a great deal of discomfort. You are unable to have a bowel  movement. You have a fever. MAKE SURE YOU: Understand these instructions. Will watch your condition. Will get help right away if you are not doing well or get worse. Document Released: 02/22/2000 Document Revised: 05/19/2011 Document Reviewed: 09/19/2010 [facility] Patient Information 2014 [place], [place].

## 2012-12-12 NOTE — Progress Notes (Signed)
  Subjective:    Patient ID: [name] Smith, male    DOB: 01-Sep-1960, 52 y.o.   MRN: [id number]  HPI  52 YO old male fell last night on steps to patio landed on tail bone. Unable to sit due to pain, daily functions walking, standing etc limited due to pain.    Also states that he has had a hemorrhoid x 3-4 days. Some blood when he wipes. Has tried preparation H with some relief.  Sacral-coccyx is painful locally with no radiation, weakness, numbness, or in continence.  No phx of serious hemorrroids.   Review of Systems  Gastrointestinal: Positive for anal bleeding and rectal pain. Negative for diarrhea and constipation.  Musculoskeletal: Positive for back pain.       Objective:   Physical Exam  Vitals reviewed. Constitutional: He is oriented to person, place, and time. He appears well-developed and well-nourished. He appears distressed.  HENT:  Head: Normocephalic.  Eyes: Conjunctivae and EOM are normal. Pupils are equal, round, and reactive to light.  Neck: Neck supple.  Pulmonary/Chest: Effort normal.  Abdominal: Normal appearance. There is no tenderness.  Genitourinary: Rectal exam shows external hemorrhoid and tenderness.     Musculoskeletal: He exhibits edema and tenderness.  Point tender at distal coccyx Rest of spine not tender Gait slow but normal rhthym.  Neurological: He is alert and oriented to person, place, and time. He exhibits normal muscle tone. Coordination normal.  Psychiatric: He has a normal mood and affect. His behavior is normal.   [facility] reading (PRIMARY) by  Dr guest fx distal coccyx minimal displaced         Assessment & Plan:  Coccyx contusion/Fx External hemorrhoid--small/painful Triamcinolone cream bid no more than 7 days. Vicodone prn pain/Inner tube cushion

## 2012-12-12 NOTE — Progress Notes (Signed)
  Subjective:    Patient ID: [name] Smith, male    DOB: [birth year], 52 y.o.   MRN: [id number]  HPI    Review of Systems     Objective:   Physical Exam        Assessment & Plan:

## 2012-12-22 ENCOUNTER — Telehealth: Payer: Self-pay

## 2012-12-22 NOTE — Telephone Encounter (Signed)
Can someone help Dr Perrin Maltese with this? He doesn't have a PCP, if not please route to him. Thanks

## 2012-12-22 NOTE — Telephone Encounter (Signed)
Patient needs more pain meds for hemorriods and cracked tail bone.   [phone]  [facility] [address]

## 2012-12-23 NOTE — Telephone Encounter (Signed)
Patient advised to return to clinic.

## 2012-12-23 NOTE — Telephone Encounter (Signed)
Please advise patient to RTC. 

## 2012-12-23 NOTE — Telephone Encounter (Signed)
I have called him, left message for him to call me back.

## 2012-12-24 ENCOUNTER — Telehealth: Payer: Self-pay

## 2012-12-24 NOTE — Telephone Encounter (Signed)
I advised him yesterday he needs to return to clinic will call again, advise same.

## 2012-12-24 NOTE — Telephone Encounter (Signed)
Called again advised again.

## 2012-12-24 NOTE — Telephone Encounter (Signed)
Patient was here to see dr guest for a cracked tailbone, he would like to get a refill on his pain medication without being seen if possible please call patient at [phone]

## 2012-12-26 ENCOUNTER — Ambulatory Visit (INDEPENDENT_AMBULATORY_CARE_PROVIDER_SITE_OTHER): Payer: BC Managed Care – PPO | Admitting: Family Medicine

## 2012-12-26 VITALS — BP 150/76 | HR 103 | Temp 98.1°F | Resp 18 | Ht 68.0 in | Wt 156.0 lb

## 2012-12-26 DIAGNOSIS — B20 Human immunodeficiency virus [HIV] disease: Secondary | ICD-10-CM

## 2012-12-26 DIAGNOSIS — K644 Residual hemorrhoidal skin tags: Secondary | ICD-10-CM

## 2012-12-26 DIAGNOSIS — S3210XA Unspecified fracture of sacrum, initial encounter for closed fracture: Secondary | ICD-10-CM

## 2012-12-26 DIAGNOSIS — Z5189 Encounter for other specified aftercare: Secondary | ICD-10-CM

## 2012-12-26 DIAGNOSIS — S300XXD Contusion of lower back and pelvis, subsequent encounter: Secondary | ICD-10-CM

## 2012-12-26 DIAGNOSIS — S300XXA Contusion of lower back and pelvis, initial encounter: Secondary | ICD-10-CM

## 2012-12-26 MED ORDER — HYDROCODONE-ACETAMINOPHEN 5-325 MG PO TABS: 1.0000 | ORAL_TABLET | Freq: Four times a day (QID) | ORAL | Status: DC | PRN

## 2012-12-26 NOTE — Progress Notes (Signed)
Subjective:  This chart was scribed for Nilda Simmer, MD by Caryn Bee, Medical Scribe. This patient was seen in Room 12  and the patient's care was started at 10:31 AM.   Patient ID: [name] Smith, male    DOB: 1960-04-04, 52 y.o.   MRN: [id number]  HPI HPI Comments: [name] Smith is a 52 y.o. male who presents to the [facility] for follow up and prescription pain medication refill for coccyx contusion and external hemorrhoids. Pt fell and cracked his tailbone 2 weeks ago and was seen at [facility]; also complained of hemorrhoid pain at the same time; treated with hydrocodone and topical steroid cream. Pt's pain is constant but not worse than it was 2 weeks ago. Pt states that the hemorrhoid  feels worse when making a BM and sitting. However, hemorrhoid is not bigger after making a BM. He has been using witch hazel with mild relief. Pt was told to discontinue the prescribed steroid cream after one week. Pt has seen some bleeding when he wipes, but there is not a lot of blood in the toilet. He has been using wet wipes and wet toilet paper to wipe. Pt also soaks in epsom salts about once per day with mild relief. Pt has had watery diarrhea a few mornings ago that he thinks if form drinking coffee. He has associated pain when sitting. He bought donut to sit on with some relief. Pt denies fever and abdominal pain. 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 He believes he may have had a colonoscopy previously, but is unsure. Pt's PCP is Dr. Clelia Croft at [facility].   Review of Systems  Constitutional: Negative for fever.  Gastrointestinal: Positive for diarrhea and blood in stool. Negative for abdominal pain.       Positive hemmorhoids  Musculoskeletal: Positive for back pain.   History   Social History  . Marital Status: Single    Spouse Name: N/A    Number of Children: N/A  . Years of Education: N/A   Occupational History  . Not on file.   Social History Main Topics  .  Smoking status: Current Every Day Smoker -- 1.00 packs/day for 36 years    Types: Cigarettes  . Smokeless tobacco: Never Used  . Alcohol Use: 3.5 oz/week    7 drink(s) per week     Comment: wine  . Drug Use: No  . Sexual Activity: No     Comment: accepted condoms   Other Topics Concern  . Not on file   Social History Narrative  . No narrative on file    Past Medical History  Diagnosis Date  . Hypertension   . Coronary atherosclerosis of unspecified type of vessel, native or graft   . Pure hypercholesterolemia   . Need for prophylactic vaccination and inoculation against influenza   . Human immunodeficiency virus (HIV) disease   . Calculus of kidney   . Esophageal reflux   . Anxiety state, unspecified    Family History  Problem Relation Age of Onset  . Arthritis Mother       Objective:   Physical Exam  Nursing note and vitals reviewed. Constitutional: He appears well-developed and well-nourished. No distress.  Eyes: Conjunctivae are normal. Pupils are equal, round, and reactive to light.  Cardiovascular:  Murmur (2/6 systolic murmur) heard. Pulmonary/Chest: He has wheezes.  Diffuse wheezing throughout  Abdominal: Soft. Bowel sounds are normal. He exhibits no distension and no mass. There is  no tenderness. There is no rebound and no guarding.  Genitourinary: Rectal exam shows external hemorrhoid and tenderness. Rectal exam shows no internal hemorrhoid, no fissure, no mass and anal tone normal.     Moderate sized hemorhoid with swelling on left perianal region with tenderness. No fluctuance or evidence of abscess with digital exam. Anular ring of erythema diffusely with slight scaling.   Musculoskeletal: Normal range of motion.       Lumbar back: He exhibits bony tenderness. He exhibits normal range of motion, no tenderness, no swelling, no edema, no pain and no spasm.  FROM of lumbar spine without pain or limitation. Coccyx tender to palpation midline.   Skin: He is  not diaphoretic.       Assessment & Plan:  The primary encounter diagnosis was Coccyx contusion, subsequent encounter. Diagnoses of External hemorrhoid, Coccyx contusion, initial encounter, Fx sacrum/coccyx-closed, initial encounter, and HIV INFECTION were also pertinent to this visit.  1. Coccyx Contusion:  Stable; good range of motion; rx for Hydrocodone provided.  Continue donut with sitting. 2.  External hemorrhoid: Persistent; no thrombosis evident.  Restart topical steroid cream bid; continue sitz baths and witch hazel.    Meds ordered this encounter  Medications  . HYDROcodone-acetaminophen (NORCO/VICODIN) 5-325 MG per tablet    Sig: Take 1 tablet by mouth every 6 (six) hours as needed for pain.    Dispense:  45 tablet    Refill:  0     I personally performed the services described in this documentation, which was scribed in my presence.  The recorded information has been reviewed and is accurate.

## 2012-12-26 NOTE — Patient Instructions (Signed)
Hemorrhoids Hemorrhoids are swollen veins around the rectum or anus. There are two types of hemorrhoids:   Internal hemorrhoids. These occur in the veins just inside the rectum. They may poke through to the outside and become irritated and painful.  External hemorrhoids. These occur in the veins outside the anus and can be felt as a painful swelling or hard lump near the anus. CAUSES  Pregnancy.   Obesity.   Constipation or diarrhea.   Straining to have a bowel movement.   Sitting for long periods on the toilet.  Heavy lifting or other activity that caused you to strain.  Anal intercourse. SYMPTOMS   Pain.   Anal itching or irritation.   Rectal bleeding.   Fecal leakage.   Anal swelling.   One or more lumps around the anus.  DIAGNOSIS  Your caregiver may be able to diagnose hemorrhoids by visual examination. Other examinations or tests that may be performed include:   Examination of the rectal area with a gloved hand (digital rectal exam).   Examination of anal canal using a small tube (scope).   A blood test if you have lost a significant amount of blood.  A test to look inside the colon (sigmoidoscopy or colonoscopy). TREATMENT Most hemorrhoids can be treated at home. However, if symptoms do not seem to be getting better or if you have a lot of rectal bleeding, your caregiver may perform a procedure to help make the hemorrhoids get smaller or remove them completely. Possible treatments include:   Placing a rubber band at the base of the hemorrhoid to cut off the circulation (rubber band ligation).   Injecting a chemical to shrink the hemorrhoid (sclerotherapy).   Using a tool to burn the hemorrhoid (infrared light therapy).   Surgically removing the hemorrhoid (hemorrhoidectomy).   Stapling the hemorrhoid to block blood flow to the tissue (hemorrhoid stapling).  HOME CARE INSTRUCTIONS   Eat foods with fiber, such as whole grains, beans,  nuts, fruits, and vegetables. Ask your doctor about taking products with added fiber in them (fibersupplements).  Increase fluid intake. Drink enough water and fluids to keep your urine clear or pale yellow.   Exercise regularly.   Go to the bathroom when you have the urge to have a bowel movement. Do not wait.   Avoid straining to have bowel movements.   Keep the anal area dry and clean. Use wet toilet paper or moist towelettes after a bowel movement.   Medicated creams and suppositories may be used or applied as directed.   Only take over-the-counter or prescription medicines as directed by your caregiver.   Take warm sitz baths for 15 20 minutes, 3 4 times a day to ease pain and discomfort.   Place ice packs on the hemorrhoids if they are tender and swollen. Using ice packs between sitz baths may be helpful.   Put ice in a plastic bag.   Place a towel between your skin and the bag.   Leave the ice on for 15 20 minutes, 3 4 times a day.   Do not use a donut-shaped pillow or sit on the toilet for long periods. This increases blood pooling and pain.  SEEK MEDICAL CARE IF:  You have increasing pain and swelling that is not controlled by treatment or medicine.  You have uncontrolled bleeding.  You have difficulty or you are unable to have a bowel movement.  You have pain or inflammation outside the area of the hemorrhoids. MAKE SURE YOU:    Understand these instructions.  Will watch your condition.  Will get help right away if you are not doing well or get worse. Document Released: 02/22/2000 Document Revised: 02/11/2012 Document Reviewed: 12/30/2011 [employer] Patient Information 2014 [employer].  

## 2013-01-13 ENCOUNTER — Other Ambulatory Visit: Payer: Self-pay

## 2013-01-18 ENCOUNTER — Ambulatory Visit: Payer: Self-pay

## 2013-01-18 NOTE — Progress Notes (Signed)
Quamel reports that things are going fairly well.  We discussed seeking psychiatric medication management for him and he agreed to go to [facility] for an evaluation.  He continues to endorse symptoms of Bipolar Disorder, but says he has been managing himself over the past few weeks with no major issues.   Plan to meet in 5 weeks or sooner if needed and available.

## 2013-01-19 ENCOUNTER — Other Ambulatory Visit: Payer: Self-pay | Admitting: *Deleted

## 2013-01-19 DIAGNOSIS — B20 Human immunodeficiency virus [HIV] disease: Secondary | ICD-10-CM

## 2013-01-19 MED ORDER — EFAVIRENZ-EMTRICITAB-TENOFOVIR 600-200-300 MG PO TABS: 1.0000 | ORAL_TABLET | Freq: Every day | ORAL | Status: DC

## 2013-01-23 ENCOUNTER — Telehealth: Payer: Self-pay

## 2013-01-23 DIAGNOSIS — K644 Residual hemorrhoidal skin tags: Secondary | ICD-10-CM

## 2013-01-23 DIAGNOSIS — S300XXA Contusion of lower back and pelvis, initial encounter: Secondary | ICD-10-CM

## 2013-01-23 NOTE — Telephone Encounter (Signed)
Seen by Dr Katrinka Blazing a few weeks ago. Has cracked tailbone and his back is painful. Can he get a refill on the hydrocodone that Dr Katrinka Blazing prescribed in October? Cb# [phone]

## 2013-01-24 MED ORDER — HYDROCODONE-ACETAMINOPHEN 5-325 MG PO TABS: 1.0000 | ORAL_TABLET | Freq: Four times a day (QID) | ORAL | Status: DC | PRN

## 2013-01-24 NOTE — Telephone Encounter (Signed)
Pt advised Rx ready for pick up

## 2013-01-24 NOTE — Telephone Encounter (Signed)
Will refill #30 hydrocodone as Dr. Katrinka Blazing is out of the office until 11/19, but Dr. Katrinka Blazing will need to advise on further refills per our controlled substance policy

## 2013-02-04 ENCOUNTER — Other Ambulatory Visit: Payer: Self-pay | Admitting: Internal Medicine

## 2013-02-08 ENCOUNTER — Other Ambulatory Visit: Payer: Self-pay | Admitting: Licensed Clinical Social Worker

## 2013-02-08 DIAGNOSIS — N529 Male erectile dysfunction, unspecified: Secondary | ICD-10-CM

## 2013-02-08 MED ORDER — SILDENAFIL CITRATE 50 MG PO TABS: 50.0000 mg | ORAL_TABLET | Freq: Every day | ORAL | Status: DC | PRN

## 2013-02-10 ENCOUNTER — Telehealth: Payer: Self-pay | Admitting: *Deleted

## 2013-02-10 NOTE — Telephone Encounter (Signed)
Faxed application to [facility] today for Md's Viagra.

## 2013-02-17 ENCOUNTER — Telehealth: Payer: Self-pay | Admitting: *Deleted

## 2013-02-17 NOTE — Telephone Encounter (Signed)
Patient's viagra arrived in office.  Pt notified for pick up.  Viagra 50 mg #30 Lot [id number] Exp 09/06/17. Andree Coss, RN

## 2013-02-24 ENCOUNTER — Ambulatory Visit: Payer: Self-pay | Admitting: Cardiovascular Disease

## 2013-03-14 ENCOUNTER — Ambulatory Visit: Payer: BC Managed Care – PPO

## 2013-03-14 DIAGNOSIS — F319 Bipolar disorder, unspecified: Secondary | ICD-10-CM

## 2013-03-14 DIAGNOSIS — F411 Generalized anxiety disorder: Secondary | ICD-10-CM

## 2013-03-14 NOTE — Progress Notes (Signed)
I met with [name] Smith again today and he was pleasant.  He reported that he had an intestinal virus over the holidays, but otherwise, was doing okay.  He said he continues to take 10 mg of fluoxetine, but isn't sure that it does anything.  He again noted that when he took more than that before, friends noticed that something was wrong - he was more irritable and difficult to talk to.  He said he is sleeping okay and nearly every day takes a nap.  He said he has not been to [facility] yet for a psychiatric evaluation, but plans to soon.  Plan to see him in one month.

## 2013-03-15 ENCOUNTER — Ambulatory Visit (INDEPENDENT_AMBULATORY_CARE_PROVIDER_SITE_OTHER): Payer: BC Managed Care – PPO | Admitting: Cardiovascular Disease

## 2013-03-15 ENCOUNTER — Encounter: Payer: Self-pay | Admitting: Cardiovascular Disease

## 2013-03-15 VITALS — BP 160/85 | HR 103 | Ht 68.0 in | Wt 155.0 lb

## 2013-03-15 DIAGNOSIS — F17200 Nicotine dependence, unspecified, uncomplicated: Secondary | ICD-10-CM

## 2013-03-15 DIAGNOSIS — I10 Essential (primary) hypertension: Secondary | ICD-10-CM

## 2013-03-15 DIAGNOSIS — I359 Nonrheumatic aortic valve disorder, unspecified: Secondary | ICD-10-CM

## 2013-03-15 DIAGNOSIS — I350 Nonrheumatic aortic (valve) stenosis: Secondary | ICD-10-CM

## 2013-03-15 DIAGNOSIS — I2510 Atherosclerotic heart disease of native coronary artery without angina pectoris: Secondary | ICD-10-CM

## 2013-03-15 DIAGNOSIS — R011 Cardiac murmur, unspecified: Secondary | ICD-10-CM

## 2013-03-15 DIAGNOSIS — R0989 Other specified symptoms and signs involving the circulatory and respiratory systems: Secondary | ICD-10-CM

## 2013-03-15 NOTE — Assessment & Plan Note (Signed)
8/13 echo had moderate AR and mild AS  F/U echo

## 2013-03-15 NOTE — Progress Notes (Signed)
Patient ID: [name] Smith, male   DOB: 11/25/1960, 53 y.o.   MRN: [id number] [name] is seen today for F/U of dyspnea, CAD S/P CABG x2 With redo 10/11. EF has been mildly reduced in the 45% range. No clinical history of CHF. No arryhthmia and no indication for AICD. Still smoking. Clinical COPD. Recent virus and still wheezing. Did not want to spend money on inhaler. Called pulmonary to get him a Xopenex sample. No SSCP, dyspnea from smoking. NO palpitatons or edema. Lots of stress regarding work and possible disability application. HIV stable and F/U with Dr Maurice March. LDL still 144 on high dose zocor which interacts with atripla and his antiretroviral Rx the least Zocor increased by Dr Maurice March 4/13 to 80 mg daily.   LFTls Normal 3/13   Having issues with depression ? Bipolar   Discussed the fact that he should not take viagra or chantix   Counseled at length about smoking cessation      ROS: Denies fever, malais, weight loss, blurry vision, decreased visual acuity, cough, sputum, SOB, hemoptysis, pleuritic pain, palpitaitons, heartburn, abdominal pain, melena, lower extremity edema, claudication, or rash.  All other systems reviewed and negative  General: Affect appropriate Healthy:  appears stated age HEENT: normal Neck supple with no adenopathy JVP normal right carotid and subclavian bruits no thyromegaly Lungs clear with no wheezing and good diaphragmatic motion Heart:  S1/S2 3/6 SEM RLSB  murmur, no rub, gallop or click PMI normal Abdomen: benighn, BS positve, no tenderness, no AAA no bruit.  No HSM or HJR Distal pulses intact with no bruits No edema Neuro non-focal Skin warm and dry No muscular weakness   Current Outpatient Prescriptions  Medication Sig Dispense Refill  . ALPRAZolam (XANAX) 0.25 MG tablet Take 1 tablet (0.25 mg total) by mouth at bedtime as needed.  30 tablet  4  . aspirin 325 MG EC tablet Take 325 mg by mouth daily.        Marland Kitchen buPROPion (WELLBUTRIN) 100 MG tablet Take  1 tablet (100 mg total) by mouth daily.  30 tablet  3  . clopidogrel (PLAVIX) 75 MG tablet Take 1 tablet (75 mg total) by mouth daily.  90 tablet  3  . efavirenz-emtricitabine-tenofovir (ATRIPLA) 600-200-300 MG per tablet Take 1 tablet by mouth daily.  30 tablet  3  . FLUoxetine (PROZAC) 10 MG capsule TAKE ONE CAPSULE BY MOUTH EVERY DAY  90 capsule  0  . folic acid (FOLVITE) 1 MG tablet TAKE 1 TABLET BY MOUTH EVERY DAY  30 tablet  11  . HYDROcodone-acetaminophen (NORCO/VICODIN) 5-325 MG per tablet Take 1 tablet by mouth every 6 (six) hours as needed.  30 tablet  0  . ibuprofen (ADVIL,MOTRIN) 600 MG tablet Take 1 tablet (600 mg total) by mouth every 6 (six) hours as needed for pain.  30 tablet  0  . metoprolol succinate (TOPROL-XL) 50 MG 24 hr tablet TAKE 1 TABLET BY MOUTH DAILY  90 tablet  0  . nicotine (NICOTROL) 10 MG inhaler Inhale 1 puff into the lungs as needed for smoking cessation.  42 each  5  . nitroGLYCERIN (NITROSTAT) 0.4 MG SL tablet Place 1 tablet (0.4 mg total) under the tongue every 5 (five) minutes as needed. For chest pain -- may repeat times three  25 tablet  11  . sildenafil (VIAGRA) 50 MG tablet Take 1 tablet (50 mg total) by mouth daily as needed.  30 tablet  0  . sildenafil (VIAGRA) 50 MG tablet  Take 1 tablet (50 mg total) by mouth daily as needed.  30 tablet  0  . simvastatin (ZOCOR) 80 MG tablet TAKE 1.5 TABLETS (120 MG TOTAL) BY MOUTH AT BEDTIME.  45 tablet  5  . triamcinolone cream (KENALOG) 0.1 % Apply topically 2 (two) times daily.  30 g  0  . valACYclovir (VALTREX) 1000 MG tablet Take 1 tablet (1,000 mg total) by mouth 3 (three) times daily.  21 tablet  1   No current facility-administered medications for this visit.    Allergies  Review of patient's allergies indicates no known allergies.  Electrocardiogram:  SR rate 102 borderline LVH    Assessment and Plan

## 2013-03-15 NOTE — Assessment & Plan Note (Signed)
Well controlled.  Continue current medications and low sodium Dash type diet.    

## 2013-03-15 NOTE — Assessment & Plan Note (Signed)
Stable with no angina and good activity level.  Continue medical Rx  

## 2013-03-15 NOTE — Patient Instructions (Signed)
Your physician wants you to follow-up in:   6 MONTHS WITH DR NISHAN You will receive a reminder letter in the mail two months in advance. If you don't receive a letter, please call our office to schedule the follow-up appointment. Your physician recommends that you continue on your current medications as directed. Please refer to the Current Medication list given to you today. Your physician has requested that you have an echocardiogram. Echocardiography is a painless test that uses sound waves to create images of your heart. It provides your doctor with information about the size and shape of your heart and how well your heart's chambers and valves are working. This procedure takes approximately one hour. There are no restrictions for this procedure.  Your physician has requested that you have a carotid duplex. This test is an ultrasound of the carotid arteries in your neck. It looks at blood flow through these arteries that supply the brain with blood. Allow one hour for this exam. There are no restrictions or special instructions.  

## 2013-03-15 NOTE — Assessment & Plan Note (Signed)
F/U duplex  BP equal in each arm despite right subclavian bruit

## 2013-03-15 NOTE — Assessment & Plan Note (Signed)
Smoking cessation instruction/counseling given:  counseled patient on the dangers of tobacco use, advised patient to stop smoking, and reviewed strategies to maximize success  Would not use Chantix given bipolar disease

## 2013-03-28 ENCOUNTER — Ambulatory Visit: Payer: Self-pay

## 2013-04-12 ENCOUNTER — Other Ambulatory Visit: Payer: Self-pay | Admitting: *Deleted

## 2013-04-12 ENCOUNTER — Ambulatory Visit: Payer: BC Managed Care – PPO

## 2013-04-12 DIAGNOSIS — F3130 Bipolar disorder, current episode depressed, mild or moderate severity, unspecified: Secondary | ICD-10-CM

## 2013-04-12 DIAGNOSIS — Z79899 Other long term (current) drug therapy: Secondary | ICD-10-CM

## 2013-04-12 DIAGNOSIS — Z113 Encounter for screening for infections with a predominantly sexual mode of transmission: Secondary | ICD-10-CM

## 2013-04-12 DIAGNOSIS — B20 Human immunodeficiency virus [HIV] disease: Secondary | ICD-10-CM

## 2013-04-12 DIAGNOSIS — K644 Residual hemorrhoidal skin tags: Secondary | ICD-10-CM

## 2013-04-12 MED ORDER — TRIAMCINOLONE ACETONIDE 0.1 % EX CREA: TOPICAL_CREAM | Freq: Two times a day (BID) | CUTANEOUS | Status: DC

## 2013-04-12 NOTE — Progress Notes (Signed)
[name] Smith was pleasant today and reported that [facility] our last session.  He did not tell them anything about our sessions, mainly to see if they confirmed the same diagnosis (Bipolar), and they did.  He got copies of the assessment and showed it to me.  It showed a diagnosis of Bipolar I Disorder with a prescription of the mood stabilizer Abilify and discontinued his Prozac.  He said he continues to feel stress over applying for disability.  He said he has stopped the Prozac (10 mg), and will start the Abilify today or tomorrow.  He reports up and down sleep.  Plan to meet in 6 weeks due to scheduling, but he said he is [facility] a [facility] now.

## 2013-04-25 ENCOUNTER — Ambulatory Visit (HOSPITAL_COMMUNITY): Payer: BC Managed Care – PPO | Attending: Cardiovascular Disease

## 2013-04-25 ENCOUNTER — Other Ambulatory Visit: Payer: BC Managed Care – PPO

## 2013-04-25 DIAGNOSIS — Z113 Encounter for screening for infections with a predominantly sexual mode of transmission: Secondary | ICD-10-CM

## 2013-04-25 DIAGNOSIS — Z951 Presence of aortocoronary bypass graft: Secondary | ICD-10-CM | POA: Insufficient documentation

## 2013-04-25 DIAGNOSIS — Z79899 Other long term (current) drug therapy: Secondary | ICD-10-CM

## 2013-04-25 DIAGNOSIS — B20 Human immunodeficiency virus [HIV] disease: Secondary | ICD-10-CM

## 2013-04-25 DIAGNOSIS — R0989 Other specified symptoms and signs involving the circulatory and respiratory systems: Secondary | ICD-10-CM | POA: Insufficient documentation

## 2013-04-25 DIAGNOSIS — I658 Occlusion and stenosis of other precerebral arteries: Secondary | ICD-10-CM | POA: Insufficient documentation

## 2013-04-25 DIAGNOSIS — I6529 Occlusion and stenosis of unspecified carotid artery: Secondary | ICD-10-CM | POA: Insufficient documentation

## 2013-04-25 DIAGNOSIS — I2510 Atherosclerotic heart disease of native coronary artery without angina pectoris: Secondary | ICD-10-CM | POA: Insufficient documentation

## 2013-04-25 DIAGNOSIS — I10 Essential (primary) hypertension: Secondary | ICD-10-CM | POA: Insufficient documentation

## 2013-04-25 DIAGNOSIS — E785 Hyperlipidemia, unspecified: Secondary | ICD-10-CM | POA: Insufficient documentation

## 2013-04-25 LAB — COMPLETE METABOLIC PANEL WITH GFR
ALT: 19 U/L (ref 0–53)
AST: 21 U/L (ref 0–37)
Albumin: 4.3 g/dL (ref 3.5–5.2)
Alkaline Phosphatase: 61 U/L (ref 39–117)
BUN: 13 mg/dL (ref 6–23)
CO2: 27 mEq/L (ref 19–32)
Calcium: 9.2 mg/dL (ref 8.4–10.5)
Chloride: 105 mEq/L (ref 96–112)
Creat: 0.95 mg/dL (ref 0.50–1.35)
GFR, Est African American: >89 mL/min
GFR, Est Non African American: >89 mL/min
Glucose, Bld: 89 mg/dL (ref 70–99)
Potassium: 4.4 mEq/L (ref 3.5–5.3)
Sodium: 137 mEq/L (ref 135–145)
Total Bilirubin: 0.5 mg/dL (ref 0.2–1.2)
Total Protein: 7 g/dL (ref 6.0–8.3)

## 2013-04-25 LAB — CBC WITH DIFFERENTIAL/PLATELET
Basophils Absolute: 0 10*3/uL (ref 0.0–0.1)
Basophils Relative: 0 % (ref 0–1)
Eosinophils Absolute: 0.2 10*3/uL (ref 0.0–0.7)
Eosinophils Relative: 2 % (ref 0–5)
HCT: 46.6 % (ref 39.0–52.0)
Hemoglobin: 16.3 g/dL (ref 13.0–17.0)
Lymphocytes Relative: 37 % (ref 12–46)
Lymphs Abs: 2.8 10*3/uL (ref 0.7–4.0)
MCH: 35.1 pg — ABNORMAL HIGH (ref 26.0–34.0)
MCHC: 35 g/dL (ref 30.0–36.0)
MCV: 100.4 fL — ABNORMAL HIGH (ref 78.0–100.0)
Monocytes Absolute: 0.5 10*3/uL (ref 0.1–1.0)
Monocytes Relative: 6 % (ref 3–12)
Neutro Abs: 4.1 10*3/uL (ref 1.7–7.7)
Neutrophils Relative %: 55 % (ref 43–77)
Platelets: 260 10*3/uL (ref 150–400)
RBC: 4.64 MIL/uL (ref 4.22–5.81)
RDW: 12.8 % (ref 11.5–15.5)
WBC: 7.5 10*3/uL (ref 4.0–10.5)

## 2013-04-25 LAB — LIPID PANEL
Cholesterol: 222 mg/dL — ABNORMAL HIGH (ref 0–200)
HDL: 58 mg/dL (ref >39)
LDL Cholesterol: 138 mg/dL — ABNORMAL HIGH (ref 0–99)
Total CHOL/HDL Ratio: 3.8 Ratio
Triglycerides: 130 mg/dL (ref <150)
VLDL: 26 mg/dL (ref 0–40)

## 2013-04-25 LAB — RPR

## 2013-04-26 ENCOUNTER — Other Ambulatory Visit: Payer: BC Managed Care – PPO

## 2013-04-26 LAB — T-HELPER CELL (CD4) - (RCID CLINIC ONLY)
CD4 % Helper T Cell: 37 % (ref 33–55)
CD4 T Cell Abs: 1080 /uL (ref 400–2700)

## 2013-04-27 LAB — HIV-1 RNA QUANT-NO REFLEX-BLD
HIV 1 RNA Quant: <20 copies/mL (ref <20)
HIV-1 RNA Quant, Log: <1.3 {Log} (ref <1.30)

## 2013-05-02 ENCOUNTER — Other Ambulatory Visit (HOSPITAL_COMMUNITY): Payer: BC Managed Care – PPO

## 2013-05-06 ENCOUNTER — Other Ambulatory Visit: Payer: Self-pay | Admitting: Internal Medicine

## 2013-05-09 ENCOUNTER — Ambulatory Visit: Payer: BC Managed Care – PPO | Admitting: Internal Medicine

## 2013-05-12 ENCOUNTER — Ambulatory Visit: Payer: BC Managed Care – PPO

## 2013-05-12 ENCOUNTER — Encounter: Payer: Self-pay | Admitting: Family Medicine

## 2013-05-12 ENCOUNTER — Ambulatory Visit (INDEPENDENT_AMBULATORY_CARE_PROVIDER_SITE_OTHER): Payer: BC Managed Care – PPO | Admitting: Family Medicine

## 2013-05-12 VITALS — BP 118/68 | HR 74 | Temp 98.0°F | Resp 16 | Ht 68.5 in | Wt 154.0 lb

## 2013-05-12 DIAGNOSIS — Z202 Contact with and (suspected) exposure to infections with a predominantly sexual mode of transmission: Secondary | ICD-10-CM

## 2013-05-12 DIAGNOSIS — S39012A Strain of muscle, fascia and tendon of lower back, initial encounter: Secondary | ICD-10-CM

## 2013-05-12 DIAGNOSIS — M5418 Radiculopathy, sacral and sacrococcygeal region: Secondary | ICD-10-CM

## 2013-05-12 DIAGNOSIS — IMO0002 Reserved for concepts with insufficient information to code with codable children: Secondary | ICD-10-CM

## 2013-05-12 MED ORDER — AZITHROMYCIN 250 MG PO TABS: ORAL_TABLET | ORAL | Status: DC

## 2013-05-12 MED ORDER — TRAMADOL HCL 50 MG PO TABS: 50.0000 mg | ORAL_TABLET | Freq: Three times a day (TID) | ORAL | Status: DC | PRN

## 2013-05-12 MED ORDER — METHOCARBAMOL 500 MG PO TABS: ORAL_TABLET | ORAL | Status: DC

## 2013-05-12 NOTE — Patient Instructions (Addendum)
Take tramadol one every 6-8 hours as needed for pain  Take methocarbamol one in the morning, one in the afternoon, and 2 at bedtime for muscle relaxants as needed   take the azithromycin 4 tablets all together in a single dose today for infection  Will let you know in a few days the results of the tests. Call if worse

## 2013-05-12 NOTE — Progress Notes (Signed)
Subjective: 53 year old man who has a history of falling last year and fracturing his coccyx. He slipped and fell on the ice today is going is hurting in the sacrococcyx area and in his low back especially on the right. No radiculopathy.  He has had some unprotected male sex, and was told that he might have been exposed to chlamydia. He gets seasonal STD testing done regularly at the ID clinic. No symptoms.  Objective: Tender in the right paraspinous muscles. Spine nontender in today to get down to the sacrum and coccyx which are pretty tender.  [facility] reading (PRIMARY) by  Dr. Alwyn RenHopper Normal xray  Assessment: Low back pain and coccyx pain  Chlamydia exposure HIV history  Plan: Zithromax 1 g single dose URiprobe is pending  Robaxin and tramadol for back  Return if problems persist.

## 2013-05-13 LAB — GC/CHLAMYDIA PROBE AMP
CT Probe RNA: NEGATIVE
GC Probe RNA: NEGATIVE

## 2013-05-17 ENCOUNTER — Encounter: Payer: Self-pay | Admitting: Cardiovascular Disease

## 2013-05-17 ENCOUNTER — Encounter: Payer: Self-pay | Admitting: Internal Medicine

## 2013-05-17 ENCOUNTER — Ambulatory Visit (HOSPITAL_COMMUNITY): Payer: BC Managed Care – PPO | Attending: Cardiovascular Disease | Admitting: Radiology

## 2013-05-17 ENCOUNTER — Ambulatory Visit (INDEPENDENT_AMBULATORY_CARE_PROVIDER_SITE_OTHER): Payer: BC Managed Care – PPO | Admitting: Internal Medicine

## 2013-05-17 VITALS — BP 177/81 | HR 95 | Temp 97.8°F | Wt 154.0 lb

## 2013-05-17 DIAGNOSIS — B20 Human immunodeficiency virus [HIV] disease: Secondary | ICD-10-CM

## 2013-05-17 DIAGNOSIS — J441 Chronic obstructive pulmonary disease with (acute) exacerbation: Secondary | ICD-10-CM

## 2013-05-17 DIAGNOSIS — R011 Cardiac murmur, unspecified: Secondary | ICD-10-CM | POA: Insufficient documentation

## 2013-05-17 DIAGNOSIS — I350 Nonrheumatic aortic (valve) stenosis: Secondary | ICD-10-CM

## 2013-05-17 DIAGNOSIS — I359 Nonrheumatic aortic valve disorder, unspecified: Secondary | ICD-10-CM | POA: Insufficient documentation

## 2013-05-17 MED ORDER — ALBUTEROL SULFATE (2.5 MG/3ML) 0.083% IN NEBU
2.5000 mg | INHALATION_SOLUTION | Freq: Once | RESPIRATORY_TRACT | Status: AC
  Administered 2013-05-17: 2.5 mg via RESPIRATORY_TRACT

## 2013-05-17 MED ORDER — TIOTROPIUM BROMIDE MONOHYDRATE 18 MCG IN CAPS: 18.0000 ug | ORAL_CAPSULE | Freq: Every day | RESPIRATORY_TRACT | Status: DC

## 2013-05-17 MED ORDER — ALBUTEROL SULFATE HFA 108 (90 BASE) MCG/ACT IN AERS: 2.0000 | INHALATION_SPRAY | Freq: Four times a day (QID) | RESPIRATORY_TRACT | Status: DC | PRN

## 2013-05-17 NOTE — Progress Notes (Signed)
Subjective:    Patient ID: [name] Smith, male    DOB: [birth year], 53 y.o.   MRN: [id number]  HPI52yo M with HIV, AR, CAD s/p CABG, COPD, cd 4 count of 1080/VL<20, on atripla who reports having worsening shortness of breath, and wheezing. Still maintains 1 PPD. He states he has COPD takes as needed albuterol inhaler. Previously had PFT (unclear when/where, not found in epic). He had an echo done today.  Current Outpatient Prescriptions on File Prior to Visit  Medication Sig Dispense Refill  . ALPRAZolam (XANAX) 0.25 MG tablet Take 1 tablet (0.25 mg total) by mouth at bedtime as needed.  30 tablet  4  . ARIPiprazole (ABILIFY) 15 MG tablet Take 15 mg by mouth daily.      Marland Kitchen. aspirin 325 MG EC tablet Take 325 mg by mouth daily.        Marland Kitchen. buPROPion (WELLBUTRIN) 100 MG tablet Take 1 tablet (100 mg total) by mouth daily.  30 tablet  3  . clopidogrel (PLAVIX) 75 MG tablet Take 1 tablet (75 mg total) by mouth daily.  90 tablet  3  . efavirenz-emtricitabine-tenofovir (ATRIPLA) 600-200-300 MG per tablet Take 1 tablet by mouth daily.  30 tablet  3  . FLUoxetine (PROZAC) 10 MG capsule TAKE ONE CAPSULE BY MOUTH EVERY DAY  90 capsule  0  . folic acid (FOLVITE) 1 MG tablet TAKE 1 TABLET BY MOUTH EVERY DAY  30 tablet  11  . HYDROcodone-acetaminophen (NORCO/VICODIN) 5-325 MG per tablet Take 1 tablet by mouth every 6 (six) hours as needed.  30 tablet  0  . ibuprofen (ADVIL,MOTRIN) 600 MG tablet Take 1 tablet (600 mg total) by mouth every 6 (six) hours as needed for pain.  30 tablet  0  . methocarbamol (ROBAXIN) 500 MG tablet Take one in the morning, one in the afternoon, and 2 at bedtime for muscle relaxant  40 tablet  0  . metoprolol succinate (TOPROL-XL) 50 MG 24 hr tablet TAKE 1 TABLET BY MOUTH EVERY DAY  90 tablet  0  . nicotine (NICOTROL) 10 MG inhaler Inhale 1 puff into the lungs as needed for smoking cessation.  42 each  5  . nitroGLYCERIN (NITROSTAT) 0.4 MG SL tablet Place 1 tablet (0.4 mg total) under  the tongue every 5 (five) minutes as needed. For chest pain -- may repeat times three  25 tablet  11  . sildenafil (VIAGRA) 50 MG tablet Take 1 tablet (50 mg total) by mouth daily as needed.  30 tablet  0  . simvastatin (ZOCOR) 80 MG tablet TAKE 1.5 TABLETS (120 MG TOTAL) BY MOUTH AT BEDTIME.  45 tablet  5  . traMADol (ULTRAM) 50 MG tablet Take 1 tablet (50 mg total) by mouth every 8 (eight) hours as needed.  20 tablet  0  . triamcinolone cream (KENALOG) 0.1 % Apply topically 2 (two) times daily.  30 g  1  . valACYclovir (VALTREX) 1000 MG tablet Take 1 tablet (1,000 mg total) by mouth 3 (three) times daily.  21 tablet  1   No current facility-administered medications on file prior to visit.   Active Ambulatory Problems    Diagnosis Date Noted  . HIV INFECTION 03/20/2006  . HYPERCHOLESTEROLEMIA 11/03/2008  . ANXIETY 11/03/2008  . SMOKER 11/06/2008  . HYPERTENSION 11/03/2008  . CORONARY ARTERY DISEASE 03/20/2006  . GERD 11/03/2008  . NEPHROLITHIASIS 11/03/2008  . COUGH 11/06/2008  . CORONARY ARTERY BYPASS GRAFT, THREE VESSEL, HX OF 03/20/2006  .  Bruit 10/28/2010  . URI (upper respiratory infection) 04/29/2011  . Murmur 10/14/2011   Resolved Ambulatory Problems    Diagnosis Date Noted  . No Resolved Ambulatory Problems   Past Medical History  Diagnosis Date  . Hypertension   . Need for prophylactic vaccination and inoculation against influenza          Review of Systems     Objective:   Physical Exam BP 177/81  Pulse 95  Temp(Src) 97.8 F (36.6 C) (Oral)  Wt 154 lb (69.854 kg)  SpO2 97%. Physical Exam  Constitutional: He is oriented to person, place, and time. He appears well-developed and well-nourished. No distress.  HENT:  Mouth/Throat: Oropharynx is clear and moist. No oropharyngeal exudate.  Cardiovascular: Normal rate, regular rhythm and normal heart sounds. Exam reveals no gallop and no friction rub.  No murmur heard.  Pulmonary/Chest: Effort normal and  Diffuse inspiratory/exp wheezing in all lung fields, no acc. m use Abdominal: Soft. Bowel sounds are normal. He exhibits no distension. There is no tenderness.  Lymphadenopathy:  He has no cervical adenopathy.  Neurological: He is alert and oriented to person, place, and time.  Skin: Skin is warm and dry. No rash noted. No erythema.  Psychiatric: He has a normal mood and affect. His behavior is normal.       Assessment & Plan:  HIV= well controlled, continue on atripla  COPD = will get repeat PFT, also albuterol PRN, will add spiriva. Refer to pulm Novice to help with management. Will do alb nebs in clinic to see if it helps him.  SOB = had echo to see if element of worsening AS/AR, in part related to copd, but will follow up on echo results  Health maintenance = uptodate on vaccines

## 2013-05-17 NOTE — Progress Notes (Signed)
Echocardiogram performed.  

## 2013-05-23 ENCOUNTER — Telehealth: Payer: Self-pay | Admitting: *Deleted

## 2013-05-23 NOTE — Telephone Encounter (Signed)
Patient called regarding the status of his pulmonology referral. Pt will go to reapply [facility]. Andree CossHowell, Michelle M, RN

## 2013-05-31 ENCOUNTER — Ambulatory Visit: Payer: BC Managed Care – PPO

## 2013-05-31 DIAGNOSIS — F319 Bipolar disorder, unspecified: Secondary | ICD-10-CM

## 2013-05-31 NOTE — Progress Notes (Signed)
[name] Smith was in a pleasant mood today, but did report that he was denied disability.  He said he is appealing this decision and is doing this on his own (without an attorney).  I recommended he contact Legal Aid for assistance and he agreed to try this.  He continues to struggle with some mood swings and says the Abilify doesn't seem to do much, if anything.  He talked about some of his past - how difficult it was being diagnosed HIV+ in 1992, when it was a "death sentence".  He said he watched most of his friends die back then.  Plan to meet in one month.

## 2013-06-08 ENCOUNTER — Encounter: Payer: Self-pay | Admitting: Cardiovascular Disease

## 2013-06-08 ENCOUNTER — Telehealth: Payer: Self-pay | Admitting: *Deleted

## 2013-06-08 ENCOUNTER — Ambulatory Visit (INDEPENDENT_AMBULATORY_CARE_PROVIDER_SITE_OTHER): Payer: BC Managed Care – PPO | Admitting: Cardiovascular Disease

## 2013-06-08 VITALS — BP 150/88 | HR 100 | Ht 68.0 in | Wt 155.0 lb

## 2013-06-08 DIAGNOSIS — F411 Generalized anxiety disorder: Secondary | ICD-10-CM

## 2013-06-08 DIAGNOSIS — I10 Essential (primary) hypertension: Secondary | ICD-10-CM

## 2013-06-08 DIAGNOSIS — B20 Human immunodeficiency virus [HIV] disease: Secondary | ICD-10-CM

## 2013-06-08 DIAGNOSIS — E7800 Pure hypercholesterolemia, unspecified: Secondary | ICD-10-CM

## 2013-06-08 DIAGNOSIS — Z0271 Encounter for disability determination: Secondary | ICD-10-CM

## 2013-06-08 DIAGNOSIS — F17200 Nicotine dependence, unspecified, uncomplicated: Secondary | ICD-10-CM

## 2013-06-08 DIAGNOSIS — I2510 Atherosclerotic heart disease of native coronary artery without angina pectoris: Secondary | ICD-10-CM

## 2013-06-08 MED ORDER — CLOPIDOGREL BISULFATE 75 MG PO TABS: 75.0000 mg | ORAL_TABLET | Freq: Every day | ORAL | Status: DC

## 2013-06-08 NOTE — Assessment & Plan Note (Signed)
Bipolar with more agitated depression centering around multiple medical problems and inability to get disability F/U psych and primary continue current meds.

## 2013-06-08 NOTE — Assessment & Plan Note (Signed)
Trying to use E cig  His depressive disease makes quitting difficult

## 2013-06-08 NOTE — Assessment & Plan Note (Signed)
Well controlled.  Continue current medications and low sodium Dash type diet.    

## 2013-06-08 NOTE — Telephone Encounter (Signed)
PT  AWARE  FORM  FILLED  OUT  AND   A LETTER   FROM DR Eden EmmsNISHAN  LEFT  AT  FRONT  DESK  FOR  PICK UP .[name] Smith/CY

## 2013-06-08 NOTE — Assessment & Plan Note (Signed)
Stable with no angina and good activity level.  Continue medical Rx  

## 2013-06-08 NOTE — Assessment & Plan Note (Signed)
Spent over 2 hours discussing and reviewing all of [name] Smith papers regarding disability.  I believe the cumalative disability of his CAD with two previous CABGls , COPD, HIV and bipolar disease should qualify him for disability.  Filled out physician form and and dictated letter.  Reviewed Dr Betti CruzLanes note form ID who also agrees with this assessment

## 2013-06-08 NOTE — Assessment & Plan Note (Signed)
F/U [facility]  Dr Maurice MarchLane has retired and will likely see Dr Daiva EvesVan Dam

## 2013-06-08 NOTE — Progress Notes (Signed)
Patient ID: [name] Smith, male   DOB: September 03, 1960, 53 y.o.   MRN: [id number] [name] Smith is seen today for F/U of dyspnea, CAD S/P CABG x2 With redo 10/11. EF has been mildly reduced in the 45% range. No clinical history of CHF. No arryhthmia and no indication for AICD. Still smoking. Clinical COPD. Recent virus and still wheezing. Did not want to spend money on inhaler. Called pulmonary to get him a Xopenex sample. No SSCP, dyspnea from smoking. NO palpitatons or edema. Lots of stress regarding work and possible disability application. HIV stable and F/U with Dr Maurice March. LDL still 144 on high dose zocor which interacts with atripla and his antiretroviral Rx the least Zocor increased by Dr Maurice March 4/13 to 80 mg daily.  LFTls Normal 3/13  Having issues with depression ? Bipolar Discussed the fact that he should not take viagra or chantix Counseled at length about smoking cessation   Recent echo with mild AS Carotids with 1-39% bilateral disease   Ardit has been doing poorly lately  The consellation of his HIV, COPD, CAD with two previous bypass surgeries and bipolar disease has disabled him   ROS: Denies fever, malais, weight loss, blurry vision, decreased visual acuity, cough, sputum, SOB, hemoptysis, pleuritic pain, palpitaitons, heartburn, abdominal pain, melena, lower extremity edema, claudication, or rash.  All other systems reviewed and negative  General: Affect agitated depression  Chronically ill  HEENT: normal Neck supple with no adenopathy JVP normal left  bruits no thyromegaly Lungs clear with no wheezing and good diaphragmatic motion Heart:  S1/S2 AS  murmur, no rub, gallop or click PMI normal Abdomen: benighn, BS positve, no tenderness, no AAA no bruit.  No HSM or HJR Distal pulses intact with no bruits No edema Neuro non-focal Skin warm and dry No muscular weakness   Current Outpatient Prescriptions  Medication Sig Dispense Refill  . albuterol (PROVENTIL HFA;VENTOLIN HFA) 108  (90 BASE) MCG/ACT inhaler Inhale 2 puffs into the lungs every 6 (six) hours as needed for wheezing or shortness of breath.  1 Inhaler  2  . ALPRAZolam (XANAX) 0.25 MG tablet Take 1 tablet (0.25 mg total) by mouth at bedtime as needed.  30 tablet  4  . ARIPiprazole (ABILIFY) 15 MG tablet Take 15 mg by mouth daily.      Marland Kitchen aspirin 325 MG EC tablet Take 325 mg by mouth daily.        Marland Kitchen buPROPion (WELLBUTRIN) 100 MG tablet Take 1 tablet (100 mg total) by mouth daily.  30 tablet  3  . clopidogrel (PLAVIX) 75 MG tablet Take 1 tablet (75 mg total) by mouth daily.  90 tablet  3  . efavirenz-emtricitabine-tenofovir (ATRIPLA) 600-200-300 MG per tablet Take 1 tablet by mouth daily.  30 tablet  3  . FLUoxetine (PROZAC) 10 MG capsule TAKE ONE CAPSULE BY MOUTH EVERY DAY  90 capsule  0  . folic acid (FOLVITE) 1 MG tablet TAKE 1 TABLET BY MOUTH EVERY DAY  30 tablet  11  . HYDROcodone-acetaminophen (NORCO/VICODIN) 5-325 MG per tablet Take 1 tablet by mouth every 6 (six) hours as needed.  30 tablet  0  . ibuprofen (ADVIL,MOTRIN) 600 MG tablet Take 1 tablet (600 mg total) by mouth every 6 (six) hours as needed for pain.  30 tablet  0  . methocarbamol (ROBAXIN) 500 MG tablet Take one in the morning, one in the afternoon, and 2 at bedtime for muscle relaxant  40 tablet  0  . metoprolol succinate (TOPROL-XL)  50 MG 24 hr tablet TAKE 1 TABLET BY MOUTH EVERY DAY  90 tablet  0  . nicotine (NICOTROL) 10 MG inhaler Inhale 1 puff into the lungs as needed for smoking cessation.  42 each  5  . nitroGLYCERIN (NITROSTAT) 0.4 MG SL tablet Place 1 tablet (0.4 mg total) under the tongue every 5 (five) minutes as needed. For chest pain -- may repeat times three  25 tablet  11  . sildenafil (VIAGRA) 50 MG tablet Take 1 tablet (50 mg total) by mouth daily as needed.  30 tablet  0  . simvastatin (ZOCOR) 80 MG tablet TAKE 1.5 TABLETS (120 MG TOTAL) BY MOUTH AT BEDTIME.  45 tablet  5  . tiotropium (SPIRIVA HANDIHALER) 18 MCG inhalation  capsule Place 1 capsule (18 mcg total) into inhaler and inhale daily.  30 capsule  12  . traMADol (ULTRAM) 50 MG tablet Take 1 tablet (50 mg total) by mouth every 8 (eight) hours as needed.  20 tablet  0  . triamcinolone cream (KENALOG) 0.1 % Apply topically 2 (two) times daily.  30 g  1  . valACYclovir (VALTREX) 1000 MG tablet Take 1 tablet (1,000 mg total) by mouth 3 (three) times daily.  21 tablet  1   No current facility-administered medications for this visit.    Allergies  Review of patient's allergies indicates no known allergies.  Electrocardiogram:   SR rate 102 LVH   Assessment and Plan

## 2013-06-08 NOTE — Patient Instructions (Signed)
Your physician wants you to follow-up in:  6 MONTHS WITH DR NISHAN  You will receive a reminder letter in the mail two months in advance. If you don't receive a letter, please call our office to schedule the follow-up appointment. Your physician recommends that you continue on your current medications as directed. Please refer to the Current Medication list given to you today. 

## 2013-06-14 ENCOUNTER — Ambulatory Visit (INDEPENDENT_AMBULATORY_CARE_PROVIDER_SITE_OTHER): Payer: BC Managed Care – PPO | Admitting: Internal Medicine

## 2013-06-14 ENCOUNTER — Encounter: Payer: Self-pay | Admitting: Internal Medicine

## 2013-06-14 VITALS — BP 177/100 | HR 105 | Temp 97.7°F | Wt 156.0 lb

## 2013-06-14 DIAGNOSIS — IMO0001 Reserved for inherently not codable concepts without codable children: Secondary | ICD-10-CM

## 2013-06-14 DIAGNOSIS — M7918 Myalgia, other site: Secondary | ICD-10-CM

## 2013-06-14 MED ORDER — TRAMADOL HCL 50 MG PO TABS: 50.0000 mg | ORAL_TABLET | Freq: Three times a day (TID) | ORAL | Status: DC | PRN

## 2013-06-14 NOTE — Progress Notes (Signed)
   Subjective:    Patient ID: [name] DiamondMichael K Melrose, male    DOB: 1960-04-06, 53 y.o.   MRN: [id number]  HPI short of breath walking to [place]. Still has ongoing msk aches.    Review of Systems     Objective:   Physical Exam        Assessment & Plan:  hiv = well controlled but concern for efv causing worsening hyperlipidemia and worsening bipolar/ depression. Will aim to switch to stribild  hld = having aches and myalgias that could be related to high dose zocor  msk arthralgia = do a trial of tramadol

## 2013-06-15 NOTE — Progress Notes (Signed)
  [facility] - Pharmacist    HPI: [name] Smith is a 53 y.o. male with well controlled HIV presents for follow up. Patient also has PMH with CAD s/p CABG x2, on several medicine outpatient.   Allergies: No Known Allergies  Vitals:    Past Medical History: Past Medical History  Diagnosis Date  . Hypertension   . Coronary atherosclerosis of unspecified type of vessel, native or graft   . Pure hypercholesterolemia   . Need for prophylactic vaccination and inoculation against influenza   . Human immunodeficiency virus (HIV) disease   . Calculus of kidney   . Esophageal reflux   . Anxiety state, unspecified     Social History: History   Social History  . Marital Status: Single    Spouse Name: N/A    Number of Children: N/A  . Years of Education: N/A   Social History Main Topics  . Smoking status: Current Every Day Smoker -- 1.00 packs/day for 36 years    Types: Cigarettes  . Smokeless tobacco: Never Used  . Alcohol Use: 3.5 oz/week    7 drink(s) per week     Comment: wine  . Drug Use: No  . Sexual Activity: No     Comment: accepted condoms   Other Topics Concern  . None   Social History Narrative  . None    Current Regimen: Atripla  Labs: HIV 1 RNA Quant (copies/mL)  Date Value  04/25/2013 <20   08/23/2012 <20   05/17/2012 64*     CD4 T Cell Abs (/uL)  Date Value  04/25/2013 1080   08/23/2012 670   05/17/2012 590      Hep B S Ab (no units)  Date Value  05/04/2006 Yes      Hepatitis B Surface Ag (no units)  Date Value  05/04/2006 No      HCV Ab (no units)  Date Value  05/04/2006 No     CrCl: The CrCl is unknown because both a height and weight (above a minimum accepted value) are required for this calculation.  Lipids:    Component Value Date/Time   CHOL 222* 04/25/2013 1027   TRIG 130 04/25/2013 1027   HDL 58 04/25/2013 1027   CHOLHDL 3.8 04/25/2013 1027   VLDL 26 04/25/2013 1027   LDLCALC 138* 04/25/2013 1027      Assessment: 53 yo M on atripla with suppressed viral load. Spoke with patient about potentially switching his medication to alternative to help avoid interactions with antidepressants and lipid medications. Patient expresses concern with muscle pain that he has been dealing with for several years. [name] Smith seems hesitant about switching medication because of cost and comfort with current regimen. Patient wants to read up and talk to his insurance company for coverage of new medications. Recommendations: Follow-up with patient to see if he is willing to switch current ARV therapy and statin therapy. Potential switch to Stribild and crestor.   Dolphus Jennyadha M Jamin Panther, Pharm.D. Clinical PGY1 Pharmacy Resident [facility] 06/15/2013, 9:44 AM

## 2013-06-28 ENCOUNTER — Ambulatory Visit: Payer: BC Managed Care – PPO

## 2013-06-28 DIAGNOSIS — F314 Bipolar disorder, current episode depressed, severe, without psychotic features: Secondary | ICD-10-CM

## 2013-06-28 DIAGNOSIS — F411 Generalized anxiety disorder: Secondary | ICD-10-CM

## 2013-06-28 NOTE — Progress Notes (Signed)
I met with [name] Smith today and helped him with some paperwork for his disability application.  He reports that he has started the Abilify, 15 mg, but said that he hasn't really noticed much of a difference.  He did add that he hasn't "gone off on anybody" lately.  He appears to be stable at this time, but does report that he tends to isolate, just to avoid dealing with people.  Plan to meet in 5 weeks due to scheduling issues.

## 2013-07-12 ENCOUNTER — Ambulatory Visit: Payer: BC Managed Care – PPO | Admitting: Internal Medicine

## 2013-07-15 ENCOUNTER — Other Ambulatory Visit: Payer: Self-pay | Admitting: Internal Medicine

## 2013-07-19 ENCOUNTER — Ambulatory Visit (INDEPENDENT_AMBULATORY_CARE_PROVIDER_SITE_OTHER): Payer: BC Managed Care – PPO | Admitting: Internal Medicine

## 2013-07-19 ENCOUNTER — Encounter: Payer: Self-pay | Admitting: Internal Medicine

## 2013-07-19 VITALS — BP 135/85 | HR 103 | Temp 97.4°F | Wt 154.0 lb

## 2013-07-19 DIAGNOSIS — N529 Male erectile dysfunction, unspecified: Secondary | ICD-10-CM

## 2013-07-19 DIAGNOSIS — M25569 Pain in unspecified knee: Secondary | ICD-10-CM

## 2013-07-19 DIAGNOSIS — E785 Hyperlipidemia, unspecified: Secondary | ICD-10-CM

## 2013-07-19 DIAGNOSIS — B20 Human immunodeficiency virus [HIV] disease: Secondary | ICD-10-CM

## 2013-07-19 MED ORDER — TRAMADOL HCL 50 MG PO TABS: 50.0000 mg | ORAL_TABLET | Freq: Four times a day (QID) | ORAL | Status: DC | PRN

## 2013-07-19 MED ORDER — SILDENAFIL CITRATE 50 MG PO TABS: 50.0000 mg | ORAL_TABLET | Freq: Every day | ORAL | Status: DC | PRN

## 2013-07-19 MED ORDER — HYDROCODONE-ACETAMINOPHEN 5-325 MG PO TABS: 1.0000 | ORAL_TABLET | Freq: Two times a day (BID) | ORAL | Status: DC | PRN

## 2013-07-19 MED ORDER — ROSUVASTATIN CALCIUM 20 MG PO TABS: 20.0000 mg | ORAL_TABLET | Freq: Every day | ORAL | Status: DC

## 2013-07-19 MED ORDER — ELVITEG-COBIC-EMTRICIT-TENOFDF 150-150-200-300 MG PO TABS: 1.0000 | ORAL_TABLET | Freq: Every day | ORAL | Status: DC

## 2013-07-19 NOTE — Progress Notes (Signed)
Subjective:    Patient ID: [name] Smith, male    DOB: [birth year], 53 y.o.   MRN: [id number]  HPI 53yo M with HIV, CD 4 coutn of 1080/VL<20 on atripla. Great adherence. Applying for disability. Has self weaned from opioids but having difficulty managing pain. Mainly complains of arthralgias in legs, currently taking some tramadol with relief but still occ has worse pain requiring other agents. He is open to switching medications. Brings in disability paperwork  Current Outpatient Prescriptions on File Prior to Visit  Medication Sig Dispense Refill  . albuterol (PROVENTIL HFA;VENTOLIN HFA) 108 (90 BASE) MCG/ACT inhaler Inhale 2 puffs into the lungs every 6 (six) hours as needed for wheezing or shortness of breath.  1 Inhaler  2  . ARIPiprazole (ABILIFY) 15 MG tablet Take 15 mg by mouth daily.      Marland Kitchen. aspirin 325 MG EC tablet Take 325 mg by mouth daily.        Marland Kitchen. buPROPion (WELLBUTRIN) 100 MG tablet Take 1 tablet (100 mg total) by mouth daily.  30 tablet  3  . clopidogrel (PLAVIX) 75 MG tablet Take 1 tablet (75 mg total) by mouth daily.  90 tablet  3  . FLUoxetine (PROZAC) 10 MG capsule TAKE ONE CAPSULE BY MOUTH EVERY DAY  90 capsule  0  . folic acid (FOLVITE) 1 MG tablet TAKE 1 TABLET BY MOUTH EVERY DAY  30 tablet  11  . ibuprofen (ADVIL,MOTRIN) 600 MG tablet Take 1 tablet (600 mg total) by mouth every 6 (six) hours as needed for pain.  30 tablet  0  . methocarbamol (ROBAXIN) 500 MG tablet Take one in the morning, one in the afternoon, and 2 at bedtime for muscle relaxant  40 tablet  0  . metoprolol succinate (TOPROL-XL) 50 MG 24 hr tablet TAKE 1 TABLET BY MOUTH EVERY DAY  90 tablet  0  . nicotine (NICOTROL) 10 MG inhaler Inhale 1 puff into the lungs as needed for smoking cessation.  42 each  5  . nitroGLYCERIN (NITROSTAT) 0.4 MG SL tablet Place 1 tablet (0.4 mg total) under the tongue every 5 (five) minutes as needed. For chest pain -- may repeat times three  25 tablet  11  . tiotropium  (SPIRIVA HANDIHALER) 18 MCG inhalation capsule Place 1 capsule (18 mcg total) into inhaler and inhale daily.  30 capsule  12  . triamcinolone cream (KENALOG) 0.1 % Apply topically 2 (two) times daily.  30 g  1  . valACYclovir (VALTREX) 1000 MG tablet Take 1 tablet (1,000 mg total) by mouth 3 (three) times daily.  21 tablet  1   No current facility-administered medications on file prior to visit.   Active Ambulatory Problems    Diagnosis Date Noted  . HIV INFECTION 03/20/2006  . HYPERCHOLESTEROLEMIA 11/03/2008  . ANXIETY 11/03/2008  . SMOKER 11/06/2008  . HYPERTENSION 11/03/2008  . CORONARY ARTERY DISEASE 03/20/2006  . GERD 11/03/2008  . NEPHROLITHIASIS 11/03/2008  . COUGH 11/06/2008  . CORONARY ARTERY BYPASS GRAFT, THREE VESSEL, HX OF 03/20/2006  . Bruit 10/28/2010  . URI (upper respiratory infection) 04/29/2011  . Murmur 10/14/2011  . Encounter for disability assessment 06/08/2013   Resolved Ambulatory Problems    Diagnosis Date Noted  . No Resolved Ambulatory Problems   Past Medical History  Diagnosis Date  . Hypertension   . Need for prophylactic vaccination and inoculation against influenza       Review of Systems 10 point ros is negative except for  pain    Objective:   Physical Exam BP 135/85  Pulse 103  Temp(Src) 97.4 F (36.3 C) (Oral)  Wt 154 lb (69.854 kg) Physical Exam  Constitutional: He is oriented to person, place, and time. He appears well-developed and well-nourished. No distress.  HENT:  Mouth/Throat: Oropharynx is clear and moist. No oropharyngeal exudate.  Cardiovascular: Normal rate, regular rhythm and normal heart sounds. Exam reveals no gallop and no friction rub.  No murmur heard.  Pulmonary/Chest: Effort normal and breath sounds normal. No respiratory distress. He has no wheezes.  Abdominal: Soft. Bowel sounds are normal. He exhibits no distension. There is no tenderness.  Lymphadenopathy:  He has no cervical adenopathy.  Neurological: He  is alert and oriented to person, place, and time.  Skin: Skin is warm and dry. No rash noted. No erythema.  Psychiatric: He has a normal mood and affect. His behavior is normal.       Assessment & Plan:  hiv = will switch to stribild, gave co pay card  Hyperlipidemia = will take him off of simvastatin 80mg  and switch to crestor 20mg  , equivalent  Arthralgia = discussed potential for referral to pain management. Will continue with tramadol for now and gave rx for vicodin 5/325 #30 NR and mention that will need to refer him if he has excessive use.  rtc in 2 months

## 2013-08-09 ENCOUNTER — Ambulatory Visit: Payer: BC Managed Care – PPO

## 2013-08-29 ENCOUNTER — Telehealth: Payer: Self-pay

## 2013-08-29 ENCOUNTER — Other Ambulatory Visit: Payer: Self-pay

## 2013-08-29 MED ORDER — HYDROCODONE-ACETAMINOPHEN 5-325 MG PO TABS: 1.0000 | ORAL_TABLET | Freq: Two times a day (BID) | ORAL | Status: DC | PRN

## 2013-08-29 NOTE — Telephone Encounter (Signed)
Sure

## 2013-08-29 NOTE — Telephone Encounter (Signed)
Patient is requesting refill of hydrocodone . He has not requested medication since last office visit.  Is it ok to refill?   Laurell Josephsammy K King, RN

## 2013-09-18 ENCOUNTER — Ambulatory Visit (INDEPENDENT_AMBULATORY_CARE_PROVIDER_SITE_OTHER): Payer: BC Managed Care – PPO | Admitting: Family Medicine

## 2013-09-18 VITALS — BP 112/58 | HR 83 | Temp 97.7°F | Resp 15 | Ht 67.5 in | Wt 152.4 lb

## 2013-09-18 DIAGNOSIS — M722 Plantar fascial fibromatosis: Secondary | ICD-10-CM

## 2013-09-18 MED ORDER — HYDROCODONE-ACETAMINOPHEN 5-325 MG PO TABS: 1.0000 | ORAL_TABLET | Freq: Two times a day (BID) | ORAL | Status: DC | PRN

## 2013-09-18 MED ORDER — MELOXICAM 7.5 MG PO TABS: 7.5000 mg | ORAL_TABLET | Freq: Every day | ORAL | Status: DC

## 2013-09-18 NOTE — Patient Instructions (Signed)
Plantar Fasciitis Plantar fasciitis is a common condition that causes foot pain. It is soreness (inflammation) of the band of tough fibrous tissue on the bottom of the foot that runs from the heel bone (calcaneus) to the ball of the foot. The cause of this soreness may be from excessive standing, poor fitting shoes, running on hard surfaces, being overweight, having an abnormal walk, or overuse (this is common in runners) of the painful foot or feet. It is also common in aerobic exercise dancers and ballet dancers. SYMPTOMS  Most people with plantar fasciitis complain of:  Severe pain in the morning on the bottom of their foot especially when taking the first steps out of bed. This pain recedes after a few minutes of walking.  Severe pain is experienced also during walking following a long period of inactivity.  Pain is worse when walking barefoot or up stairs DIAGNOSIS   Your caregiver will diagnose this condition by examining and feeling your foot.  Special tests such as X-rays of your foot, are usually not needed. PREVENTION   Consult a sports medicine professional before beginning a new exercise program.  Walking programs offer a good workout. With walking there is a lower chance of overuse injuries common to runners. There is less impact and less jarring of the joints.  Begin all new exercise programs slowly. If problems or pain develop, decrease the amount of time or distance until you are at a comfortable level.  Wear good shoes and replace them regularly.  Stretch your foot and the heel cords at the back of the ankle (Achilles tendon) both before and after exercise.  Run or exercise on even surfaces that are not hard. For example, asphalt is better than pavement.  Do not run barefoot on hard surfaces.  If using a treadmill, vary the incline.  Do not continue to workout if you have foot or joint problems. Seek professional help if they do not improve. HOME CARE INSTRUCTIONS     Avoid activities that cause you pain until you recover.  Use ice or cold packs on the problem or painful areas after working out.  Only take over-the-counter or prescription medicines for pain, discomfort, or fever as directed by your caregiver.  Soft shoe inserts or athletic shoes with air or gel sole cushions may be helpful.  If problems continue or become more severe, consult a sports medicine caregiver or your own health care provider. Cortisone is a potent anti-inflammatory medication that may be injected into the painful area. You can discuss this treatment with your caregiver. MAKE SURE YOU:   Understand these instructions.  Will watch your condition.  Will get help right away if you are not doing well or get worse. Document Released: 11/19/2000 Document Revised: 05/19/2011 Document Reviewed: 01/19/2008 Exit[employer] Patient Information 2015 [employer]. This information is not intended to replace advice given to you by your health care provider. Make sure you discuss any questions you have with your health care provider.   Plantar Fasciitis (Heel Spur Syndrome) with Rehab The plantar fascia is a fibrous, ligament-like, soft-tissue structure that spans the bottom of the foot. Plantar fasciitis is a condition that causes pain in the foot due to inflammation of the tissue. SYMPTOMS   Pain and tenderness on the underneath side of the foot.  Pain that worsens with standing or walking. CAUSES  Plantar fasciitis is caused by irritation and injury to the plantar fascia on the underneath side of the foot. Common mechanisms of injury include:    Direct trauma to bottom of the foot.  Damage to a small nerve that runs under the foot where the main fascia attaches to the heel bone.  Stress placed on the plantar fascia due to bone spurs. RISK INCREASES WITH:   Activities that place stress on the plantar fascia (running, jumping, pivoting, or cutting).  Poor strength and  flexibility.  Improperly fitted shoes.  Tight calf muscles.  Flat feet.  Failure to warm-up properly before activity.  Obesity. PREVENTION  Warm up and stretch properly before activity.  Allow for adequate recovery between workouts.  Maintain physical fitness:  Strength, flexibility, and endurance.  Cardiovascular fitness.  Maintain a health body weight.  Avoid stress on the plantar fascia.  Wear properly fitted shoes, including arch supports for individuals who have flat feet. PROGNOSIS  If treated properly, then the symptoms of plantar fasciitis usually resolve without surgery. However, occasionally surgery is necessary. RELATED COMPLICATIONS   Recurrent symptoms that may result in a chronic condition.  Problems of the lower back that are caused by compensating for the injury, such as limping.  Pain or weakness of the foot during push-off following surgery.  Chronic inflammation, scarring, and partial or complete fascia tear, occurring more often from repeated injections. TREATMENT  Treatment initially involves the use of ice and medication to help reduce pain and inflammation. The use of strengthening and stretching exercises may help reduce pain with activity, especially stretches of the Achilles tendon. These exercises may be performed at home or with a therapist. Your caregiver may recommend that you use heel cups of arch supports to help reduce stress on the plantar fascia. Occasionally, corticosteroid injections are given to reduce inflammation. If symptoms persist for greater than 6 months despite non-surgical (conservative), then surgery may be recommended.  MEDICATION   If pain medication is necessary, then nonsteroidal anti-inflammatory medications, such as aspirin and ibuprofen, or other minor pain relievers, such as acetaminophen, are often recommended.  Do not take pain medication within 7 days before surgery.  Prescription pain relievers may be given if  deemed necessary by your caregiver. Use only as directed and only as much as you need.  Corticosteroid injections may be given by your caregiver. These injections should be reserved for the most serious cases, because they may only be given a certain number of times. HEAT AND COLD  Cold treatment (icing) relieves pain and reduces inflammation. Cold treatment should be applied for 10 to 15 minutes every 2 to 3 hours for inflammation and pain and immediately after any activity that aggravates your symptoms. Use ice packs or massage the area with a piece of ice (ice massage).  Heat treatment may be used prior to performing the stretching and strengthening activities prescribed by your caregiver, physical therapist, or athletic trainer. Use a heat pack or soak the injury in warm water. SEEK IMMEDIATE MEDICAL CARE IF:  Treatment seems to offer no benefit, or the condition worsens.  Any medications produce adverse side effects. EXERCISES RANGE OF MOTION (ROM) AND STRETCHING EXERCISES - Plantar Fasciitis (Heel Spur Syndrome) These exercises may help you when beginning to rehabilitate your injury. Your symptoms may resolve with or without further involvement from your physician, physical therapist or athletic trainer. While completing these exercises, remember:   Restoring tissue flexibility helps normal motion to return to the joints. This allows healthier, less painful movement and activity.  An effective stretch should be held for at least 30 seconds.  A stretch should never be painful. You should   only feel a gentle lengthening or release in the stretched tissue. RANGE OF MOTION - Toe Extension, Flexion  Sit with your right / left leg crossed over your opposite knee.  Grasp your toes and gently pull them back toward the top of your foot. You should feel a stretch on the bottom of your toes and/or foot.  Hold this stretch for __________ seconds.  Now, gently pull your toes toward the bottom  of your foot. You should feel a stretch on the top of your toes and or foot.  Hold this stretch for __________ seconds. Repeat __________ times. Complete this stretch __________ times per day.  RANGE OF MOTION - Ankle Dorsiflexion, Active Assisted  Remove shoes and sit on a chair that is preferably not on a carpeted surface.  Place right / left foot under knee. Extend your opposite leg for support.  Keeping your heel down, slide your right / left foot back toward the chair until you feel a stretch at your ankle or calf. If you do not feel a stretch, slide your bottom forward to the edge of the chair, while still keeping your heel down.  Hold this stretch for __________ seconds. Repeat __________ times. Complete this stretch __________ times per day.  STRETCH - Gastroc, Standing  Place hands on wall.  Extend right / left leg, keeping the front knee somewhat bent.  Slightly point your toes inward on your back foot.  Keeping your right / left heel on the floor and your knee straight, shift your weight toward the wall, not allowing your back to arch.  You should feel a gentle stretch in the right / left calf. Hold this position for __________ seconds. Repeat __________ times. Complete this stretch __________ times per day. STRETCH - Soleus, Standing  Place hands on wall.  Extend right / left leg, keeping the other knee somewhat bent.  Slightly point your toes inward on your back foot.  Keep your right / left heel on the floor, bend your back knee, and slightly shift your weight over the back leg so that you feel a gentle stretch deep in your back calf.  Hold this position for __________ seconds. Repeat __________ times. Complete this stretch __________ times per day. STRETCH - Gastrocsoleus, Standing  Note: This exercise can place a lot of stress on your foot and ankle. Please complete this exercise only if specifically instructed by your caregiver.   Place the ball of your right  / left foot on a step, keeping your other foot firmly on the same step.  Hold on to the wall or a rail for balance.  Slowly lift your other foot, allowing your body weight to press your heel down over the edge of the step.  You should feel a stretch in your right / left calf.  Hold this position for __________ seconds.  Repeat this exercise with a slight bend in your right / left knee. Repeat __________ times. Complete this stretch __________ times per day.  STRENGTHENING EXERCISES - Plantar Fasciitis (Heel Spur Syndrome)  These exercises may help you when beginning to rehabilitate your injury. They may resolve your symptoms with or without further involvement from your physician, physical therapist or athletic trainer. While completing these exercises, remember:   Muscles can gain both the endurance and the strength needed for everyday activities through controlled exercises.  Complete these exercises as instructed by your physician, physical therapist or athletic trainer. Progress the resistance and repetitions only as guided. STRENGTH - Towel   Curls  Sit in a chair positioned on a non-carpeted surface.  Place your foot on a towel, keeping your heel on the floor.  Pull the towel toward your heel by only curling your toes. Keep your heel on the floor.  If instructed by your physician, physical therapist or athletic trainer, add ____________________ at the end of the towel. Repeat __________ times. Complete this exercise __________ times per day. STRENGTH - Ankle Inversion  Secure one end of a rubber exercise band/tubing to a fixed object (table, pole). Loop the other end around your foot just before your toes.  Place your fists between your knees. This will focus your strengthening at your ankle.  Slowly, pull your big toe up and in, making sure the band/tubing is positioned to resist the entire motion.  Hold this position for __________ seconds.  Have your muscles resist the  band/tubing as it slowly pulls your foot back to the starting position. Repeat __________ times. Complete this exercises __________ times per day.  Document Released: 02/24/2005 Document Revised: 05/19/2011 Document Reviewed: 06/08/2008 [employer] Patient Information 2015 [employer]. This information is not intended to replace advice given to you by your health care provider. Make sure you discuss any questions you have with your health care provider.   

## 2013-09-18 NOTE — Progress Notes (Signed)
Subjective:   This chart was scribed for Norberto Sorenson MD  by Arlan Organ, [facility] Scribe. This patient was seen in room 10 and the patient's care was started 8:39 AM.    Patient ID: [name] Smith, male    DOB: 1960/10/08, 54 y.o.   MRN: [id number]  Chief Complaint  Patient presents with  . Plantar Fasciitis    since this am    HPI  HPI Comments: [name] Smith is a 53 y.o. Male with a PMHx of HTN, Hypercholesterolemia, and HIV who presents to [facility] complaining of possible intermittent, moderate plantar fasciitis to the R foot onset 2 months that has gradually worsened this morning around 3 AM after waking from sleep to use the restroom. No recent injury or trauma. He has not tried any medications for relief as he states he is currently out of his prescriptions. He has been evaluated by a Podiatrist and was given a lift for his shoe. Pt admits to not wearing the lift regularly as he wears flip flops every day. He denies any previous injections to foot. Pt has been non-compliant with directed routine stretches and icing from Podiatrist to improve symptoms. No fever or chills at this time. He has no other pertinent past medical history. No other concerns this visit.   Past Medical History  Diagnosis Date  . Hypertension   . Coronary atherosclerosis of unspecified type of vessel, native or graft   . Pure hypercholesterolemia   . Need for prophylactic vaccination and inoculation against influenza   . Human immunodeficiency virus (HIV) disease   . Calculus of kidney   . Esophageal reflux   . Anxiety state, unspecified     Current Outpatient Prescriptions on File Prior to Visit  Medication Sig Dispense Refill  . albuterol (PROVENTIL HFA;VENTOLIN HFA) 108 (90 BASE) MCG/ACT inhaler Inhale 2 puffs into the lungs every 6 (six) hours as needed for wheezing or shortness of breath.  1 Inhaler  2  . ARIPiprazole (ABILIFY) 15 MG tablet Take 15 mg by  mouth daily.      Marland Kitchen aspirin 325 MG EC tablet Take 325 mg by mouth daily.        Marland Kitchen buPROPion (WELLBUTRIN) 100 MG tablet Take 1 tablet (100 mg total) by mouth daily.  30 tablet  3  . clopidogrel (PLAVIX) 75 MG tablet Take 1 tablet (75 mg total) by mouth daily.  90 tablet  3  . elvitegravir-cobicistat-emtricitabine-tenofovir (STRIBILD) 150-150-200-300 MG TABS tablet Take 1 tablet by mouth daily with breakfast.  30 tablet  11  . FLUoxetine (PROZAC) 10 MG capsule TAKE ONE CAPSULE BY MOUTH EVERY DAY  90 capsule  0  . folic acid (FOLVITE) 1 MG tablet TAKE 1 TABLET BY MOUTH EVERY DAY  30 tablet  11  . HYDROcodone-acetaminophen (NORCO/VICODIN) 5-325 MG per tablet Take 1 tablet by mouth 2 (two) times daily as needed for moderate pain.  30 tablet  0  . ibuprofen (ADVIL,MOTRIN) 600 MG tablet Take 1 tablet (600 mg total) by mouth every 6 (six) hours as needed for pain.  30 tablet  0  . methocarbamol (ROBAXIN) 500 MG tablet Take one in the morning, one in the afternoon, and 2 at bedtime for muscle relaxant  40 tablet  0  . metoprolol succinate (TOPROL-XL) 50 MG 24 hr tablet TAKE 1 TABLET BY MOUTH EVERY DAY  90 tablet  0  . nicotine (NICOTROL) 10 MG inhaler Inhale 1  puff into the lungs as needed for smoking cessation.  42 each  5  . nitroGLYCERIN (NITROSTAT) 0.4 MG SL tablet Place 1 tablet (0.4 mg total) under the tongue every 5 (five) minutes as needed. For chest pain -- may repeat times three  25 tablet  11  . rosuvastatin (CRESTOR) 20 MG tablet Take 1 tablet (20 mg total) by mouth daily.  30 tablet  11  . sildenafil (VIAGRA) 50 MG tablet Take 1 tablet (50 mg total) by mouth daily as needed.  30 tablet  0  . tiotropium (SPIRIVA HANDIHALER) 18 MCG inhalation capsule Place 1 capsule (18 mcg total) into inhaler and inhale daily.  30 capsule  12  . traMADol (ULTRAM) 50 MG tablet Take 1 tablet (50 mg total) by mouth every 6 (six) hours as needed.  45 tablet  2  . triamcinolone cream (KENALOG) 0.1 % Apply topically 2  (two) times daily.  30 g  1  . valACYclovir (VALTREX) 1000 MG tablet Take 1 tablet (1,000 mg total) by mouth 3 (three) times daily.  21 tablet  1   No current facility-administered medications on file prior to visit.    No Known Allergies     Review of Systems  Constitutional: Negative for fever and chills.  Musculoskeletal: Positive for arthralgias (R foot).  Skin: Negative for rash.  Neurological: Negative for weakness and numbness.    Triage Vitals: BP 112/58  Pulse 83  Temp(Src) 97.7 F (36.5 C) (Oral)  Resp 15  Ht 5' 7.5" (1.715 m)  Wt 152 lb 6.4 oz (69.128 kg)  BMI 23.50 kg/m2  SpO2 95%   Objective:  Physical Exam  Nursing note and vitals reviewed. Constitutional: He is oriented to person, place, and time. He appears well-developed and well-nourished.  HENT:  Head: Normocephalic.  Eyes: EOM are normal.  Neck: Normal range of motion.  Cardiovascular:  Pulses:      Dorsalis pedis pulses are 2+ on the right side.       Posterior tibial pulses are 2+ on the right side.  Pulmonary/Chest: Effort normal.  Abdominal: He exhibits no distension.  Musculoskeletal: Normal range of motion.  Point tenderness to center of calcaneous on plantar aspect  Neurological: He is alert and oriented to person, place, and time.  4/5 strength with plantar and dorstal flexion  Psychiatric: He has a normal mood and affect.    Assessment & Plan:   Pt admits to not wearing a lift as often as we should due to shoe choice. Will provide a prescription for a boot to wear at night time to help improve symptoms.  Plantar fasciitis of right foot  Meds ordered this encounter  Medications  . HYDROcodone-acetaminophen (NORCO/VICODIN) 5-325 MG per tablet    Sig: Take 1 tablet by mouth 2 (two) times daily as needed for moderate pain.    Dispense:  30 tablet    Refill:  0  . meloxicam (MOBIC) 7.5 MG tablet    Sig: Take 1 tablet (7.5 mg total) by mouth daily. Prn pain    Dispense:  30 tablet     Refill:  0    I personally performed the services described in this documentation, which was scribed in my presence. The recorded information has been reviewed and considered, and addended by me as needed.  Norberto SorensonEva [name], MD MPH

## 2013-10-03 ENCOUNTER — Other Ambulatory Visit: Payer: BC Managed Care – PPO

## 2013-10-03 DIAGNOSIS — B20 Human immunodeficiency virus [HIV] disease: Secondary | ICD-10-CM

## 2013-10-03 LAB — CBC WITH DIFFERENTIAL/PLATELET
Basophils Absolute: 0 10*3/uL (ref 0.0–0.1)
Basophils Relative: 0 % (ref 0–1)
Eosinophils Absolute: 0.1 10*3/uL (ref 0.0–0.7)
Eosinophils Relative: 1 % (ref 0–5)
HCT: 49.3 % (ref 39.0–52.0)
Hemoglobin: 17.1 g/dL — ABNORMAL HIGH (ref 13.0–17.0)
Lymphocytes Relative: 29 % (ref 12–46)
Lymphs Abs: 1.9 10*3/uL (ref 0.7–4.0)
MCH: 34 pg (ref 26.0–34.0)
MCHC: 34.7 g/dL (ref 30.0–36.0)
MCV: 98 fL (ref 78.0–100.0)
Monocytes Absolute: 0.3 10*3/uL (ref 0.1–1.0)
Monocytes Relative: 5 % (ref 3–12)
Neutro Abs: 4.2 10*3/uL (ref 1.7–7.7)
Neutrophils Relative %: 65 % (ref 43–77)
Platelets: 220 10*3/uL (ref 150–400)
RBC: 5.03 MIL/uL (ref 4.22–5.81)
RDW: 13 % (ref 11.5–15.5)
WBC: 6.4 10*3/uL (ref 4.0–10.5)

## 2013-10-03 LAB — COMPLETE METABOLIC PANEL WITHOUT GFR
ALT: 20 U/L (ref 0–53)
AST: 19 U/L (ref 0–37)
Albumin: 4 g/dL (ref 3.5–5.2)
Alkaline Phosphatase: 59 U/L (ref 39–117)
BUN: 12 mg/dL (ref 6–23)
CO2: 28 meq/L (ref 19–32)
Calcium: 9.4 mg/dL (ref 8.4–10.5)
Chloride: 97 meq/L (ref 96–112)
Creat: 1 mg/dL (ref 0.50–1.35)
GFR, Est African American: >89 mL/min
GFR, Est Non African American: 86 mL/min
Glucose, Bld: 140 mg/dL — ABNORMAL HIGH (ref 70–99)
Potassium: 4.2 meq/L (ref 3.5–5.3)
Sodium: 132 meq/L — ABNORMAL LOW (ref 135–145)
Total Bilirubin: 0.6 mg/dL (ref 0.2–1.2)
Total Protein: 7 g/dL (ref 6.0–8.3)

## 2013-10-04 ENCOUNTER — Telehealth: Payer: Self-pay | Admitting: Family Medicine

## 2013-10-04 LAB — T-HELPER CELL (CD4) - (RCID CLINIC ONLY)
CD4 % Helper T Cell: 43 % (ref 33–55)
CD4 T Cell Abs: 860 /uL (ref 400–2700)

## 2013-10-04 NOTE — Telephone Encounter (Signed)
PATIENT NEEDS REFILL OF HYDROCODONE

## 2013-10-05 LAB — HIV-1 RNA QUANT-NO REFLEX-BLD
HIV 1 RNA Quant: <20 copies/mL (ref <20)
HIV-1 RNA Quant, Log: <1.3 {Log} (ref <1.30)

## 2013-10-05 NOTE — Telephone Encounter (Signed)
I do not refill controlled pain medicines. Either he needs to come in for an OV so we can figure out how else to help him or I would be happy to refer him to podiatry for his plantar fasciitis

## 2013-10-06 NOTE — Telephone Encounter (Signed)
lmom for pt to cb

## 2013-10-07 ENCOUNTER — Ambulatory Visit (INDEPENDENT_AMBULATORY_CARE_PROVIDER_SITE_OTHER): Payer: BC Managed Care – PPO | Admitting: Family Medicine

## 2013-10-07 ENCOUNTER — Encounter: Payer: Self-pay | Admitting: Family Medicine

## 2013-10-07 VITALS — BP 120/64 | HR 81 | Temp 98.4°F | Resp 16 | Ht 67.5 in | Wt 150.8 lb

## 2013-10-07 DIAGNOSIS — M722 Plantar fascial fibromatosis: Secondary | ICD-10-CM

## 2013-10-07 MED ORDER — HYDROCODONE-ACETAMINOPHEN 5-325 MG PO TABS: 1.0000 | ORAL_TABLET | Freq: Two times a day (BID) | ORAL | Status: DC | PRN

## 2013-10-07 NOTE — Telephone Encounter (Signed)
Pt wanted to schedule an appt- transferred to billing.

## 2013-10-07 NOTE — Patient Instructions (Signed)
Plantar Fasciitis (Heel Spur Syndrome) with Rehab The plantar fascia is a fibrous, ligament-like, soft-tissue structure that spans the bottom of the foot. Plantar fasciitis is a condition that causes pain in the foot due to inflammation of the tissue. SYMPTOMS   Pain and tenderness on the underneath side of the foot.  Pain that worsens with standing or walking. CAUSES  Plantar fasciitis is caused by irritation and injury to the plantar fascia on the underneath side of the foot. Common mechanisms of injury include:  Direct trauma to bottom of the foot.  Damage to a small nerve that runs under the foot where the main fascia attaches to the heel bone.  Stress placed on the plantar fascia due to bone spurs. RISK INCREASES WITH:   Activities that place stress on the plantar fascia (running, jumping, pivoting, or cutting).  Poor strength and flexibility.  Improperly fitted shoes.  Tight calf muscles.  Flat feet.  Failure to warm-up properly before activity.  Obesity. PREVENTION  Warm up and stretch properly before activity.  Allow for adequate recovery between workouts.  Maintain physical fitness:  Strength, flexibility, and endurance.  Cardiovascular fitness.  Maintain a health body weight.  Avoid stress on the plantar fascia.  Wear properly fitted shoes, including arch supports for individuals who have flat feet. PROGNOSIS  If treated properly, then the symptoms of plantar fasciitis usually resolve without surgery. However, occasionally surgery is necessary. RELATED COMPLICATIONS   Recurrent symptoms that may result in a chronic condition.  Problems of the lower back that are caused by compensating for the injury, such as limping.  Pain or weakness of the foot during push-off following surgery.  Chronic inflammation, scarring, and partial or complete fascia tear, occurring more often from repeated injections. TREATMENT  Treatment initially involves the use of  ice and medication to help reduce pain and inflammation. The use of strengthening and stretching exercises may help reduce pain with activity, especially stretches of the Achilles tendon. These exercises may be performed at home or with a therapist. Your caregiver may recommend that you use heel cups of arch supports to help reduce stress on the plantar fascia. Occasionally, corticosteroid injections are given to reduce inflammation. If symptoms persist for greater than 6 months despite non-surgical (conservative), then surgery may be recommended.  MEDICATION   If pain medication is necessary, then nonsteroidal anti-inflammatory medications, such as aspirin and ibuprofen, or other minor pain relievers, such as acetaminophen, are often recommended.  Do not take pain medication within 7 days before surgery.  Prescription pain relievers may be given if deemed necessary by your caregiver. Use only as directed and only as much as you need.  Corticosteroid injections may be given by your caregiver. These injections should be reserved for the most serious cases, because they may only be given a certain number of times. HEAT AND COLD  Cold treatment (icing) relieves pain and reduces inflammation. Cold treatment should be applied for 10 to 15 minutes every 2 to 3 hours for inflammation and pain and immediately after any activity that aggravates your symptoms. Use ice packs or massage the area with a piece of ice (ice massage).  Heat treatment may be used prior to performing the stretching and strengthening activities prescribed by your caregiver, physical therapist, or athletic trainer. Use a heat pack or soak the injury in warm water. SEEK IMMEDIATE MEDICAL CARE IF:  Treatment seems to offer no benefit, or the condition worsens.  Any medications produce adverse side effects. EXERCISES RANGE   OF MOTION (ROM) AND STRETCHING EXERCISES - Plantar Fasciitis (Heel Spur Syndrome) These exercises may help you  when beginning to rehabilitate your injury. Your symptoms may resolve with or without further involvement from your physician, physical therapist or athletic trainer. While completing these exercises, remember:   Restoring tissue flexibility helps normal motion to return to the joints. This allows healthier, less painful movement and activity.  An effective stretch should be held for at least 30 seconds.  A stretch should never be painful. You should only feel a gentle lengthening or release in the stretched tissue. RANGE OF MOTION - Toe Extension, Flexion  Sit with your right / left leg crossed over your opposite knee.  Grasp your toes and gently pull them back toward the top of your foot. You should feel a stretch on the bottom of your toes and/or foot.  Hold this stretch for __________ seconds.  Now, gently pull your toes toward the bottom of your foot. You should feel a stretch on the top of your toes and or foot.  Hold this stretch for __________ seconds. Repeat __________ times. Complete this stretch __________ times per day.  RANGE OF MOTION - Ankle Dorsiflexion, Active Assisted  Remove shoes and sit on a chair that is preferably not on a carpeted surface.  Place right / left foot under knee. Extend your opposite leg for support.  Keeping your heel down, slide your right / left foot back toward the chair until you feel a stretch at your ankle or calf. If you do not feel a stretch, slide your bottom forward to the edge of the chair, while still keeping your heel down.  Hold this stretch for __________ seconds. Repeat __________ times. Complete this stretch __________ times per day.  STRETCH - Gastroc, Standing  Place hands on wall.  Extend right / left leg, keeping the front knee somewhat bent.  Slightly point your toes inward on your back foot.  Keeping your right / left heel on the floor and your knee straight, shift your weight toward the wall, not allowing your back to  arch.  You should feel a gentle stretch in the right / left calf. Hold this position for __________ seconds. Repeat __________ times. Complete this stretch __________ times per day. STRETCH - Soleus, Standing  Place hands on wall.  Extend right / left leg, keeping the other knee somewhat bent.  Slightly point your toes inward on your back foot.  Keep your right / left heel on the floor, bend your back knee, and slightly shift your weight over the back leg so that you feel a gentle stretch deep in your back calf.  Hold this position for __________ seconds. Repeat __________ times. Complete this stretch __________ times per day. STRETCH - Gastrocsoleus, Standing  Note: This exercise can place a lot of stress on your foot and ankle. Please complete this exercise only if specifically instructed by your caregiver.   Place the ball of your right / left foot on a step, keeping your other foot firmly on the same step.  Hold on to the wall or a rail for balance.  Slowly lift your other foot, allowing your body weight to press your heel down over the edge of the step.  You should feel a stretch in your right / left calf.  Hold this position for __________ seconds.  Repeat this exercise with a slight bend in your right / left knee. Repeat __________ times. Complete this stretch __________ times per day.    STRENGTHENING EXERCISES - Plantar Fasciitis (Heel Spur Syndrome)  These exercises may help you when beginning to rehabilitate your injury. They may resolve your symptoms with or without further involvement from your physician, physical therapist or athletic trainer. While completing these exercises, remember:   Muscles can gain both the endurance and the strength needed for everyday activities through controlled exercises.  Complete these exercises as instructed by your physician, physical therapist or athletic trainer. Progress the resistance and repetitions only as guided. STRENGTH -  Towel Curls  Sit in a chair positioned on a non-carpeted surface.  Place your foot on a towel, keeping your heel on the floor.  Pull the towel toward your heel by only curling your toes. Keep your heel on the floor.  If instructed by your physician, physical therapist or athletic trainer, add ____________________ at the end of the towel. Repeat __________ times. Complete this exercise __________ times per day. STRENGTH - Ankle Inversion  Secure one end of a rubber exercise band/tubing to a fixed object (table, pole). Loop the other end around your foot just before your toes.  Place your fists between your knees. This will focus your strengthening at your ankle.  Slowly, pull your big toe up and in, making sure the band/tubing is positioned to resist the entire motion.  Hold this position for __________ seconds.  Have your muscles resist the band/tubing as it slowly pulls your foot back to the starting position. Repeat __________ times. Complete this exercises __________ times per day.  Document Released: 02/24/2005 Document Revised: 05/19/2011 Document Reviewed: 06/08/2008 [employer] Patient Information 2015 [employer]. This information is not intended to replace advice given to you by your health care provider. Make sure you discuss any questions you have with your health care provider.  

## 2013-10-07 NOTE — Progress Notes (Signed)
Subjective:    Patient ID: [name] Smith, male    DOB: 1960/10/12, 53 y.o.   MRN: [id number] This chart was scribed for Sherren MochaEva N Dalyce Renne, MD by Julian HyMorgan Graham, ED Scribe. The patient was seen in Room 22. The patient's care was started at 4:43 PM.   Chief Complaint  Patient presents with  . Foot Pain    10/06/2013   Past Medical History  Diagnosis Date  . Hypertension   . Coronary atherosclerosis of unspecified type of vessel, native or graft   . Pure hypercholesterolemia   . Need for prophylactic vaccination and inoculation against influenza   . Human immunodeficiency virus (HIV) disease   . Calculus of kidney   . Esophageal reflux   . Anxiety state, unspecified    Current Outpatient Prescriptions on File Prior to Visit  Medication Sig Dispense Refill  . albuterol (PROVENTIL HFA;VENTOLIN HFA) 108 (90 BASE) MCG/ACT inhaler Inhale 2 puffs into the lungs every 6 (six) hours as needed for wheezing or shortness of breath.  1 Inhaler  2  . ARIPiprazole (ABILIFY) 15 MG tablet Take 15 mg by mouth daily.      Marland Kitchen. aspirin 325 MG EC tablet Take 325 mg by mouth daily.        Marland Kitchen. buPROPion (WELLBUTRIN) 100 MG tablet Take 1 tablet (100 mg total) by mouth daily.  30 tablet  3  . clopidogrel (PLAVIX) 75 MG tablet Take 1 tablet (75 mg total) by mouth daily.  90 tablet  3  . elvitegravir-cobicistat-emtricitabine-tenofovir (STRIBILD) 150-150-200-300 MG TABS tablet Take 1 tablet by mouth daily with breakfast.  30 tablet  11  . FLUoxetine (PROZAC) 10 MG capsule TAKE ONE CAPSULE BY MOUTH EVERY DAY  90 capsule  0  . folic acid (FOLVITE) 1 MG tablet TAKE 1 TABLET BY MOUTH EVERY DAY  30 tablet  11  . HYDROcodone-acetaminophen (NORCO/VICODIN) 5-325 MG per tablet Take 1 tablet by mouth 2 (two) times daily as needed for moderate pain.  30 tablet  0  . ibuprofen (ADVIL,MOTRIN) 600 MG tablet Take 1 tablet (600 mg total) by mouth every 6 (six) hours as needed for pain.  30 tablet  0  . meloxicam (MOBIC) 7.5 MG tablet  Take 1 tablet (7.5 mg total) by mouth daily. Prn pain  30 tablet  0  . methocarbamol (ROBAXIN) 500 MG tablet Take one in the morning, one in the afternoon, and 2 at bedtime for muscle relaxant  40 tablet  0  . metoprolol succinate (TOPROL-XL) 50 MG 24 hr tablet TAKE 1 TABLET BY MOUTH EVERY DAY  90 tablet  0  . nicotine (NICOTROL) 10 MG inhaler Inhale 1 puff into the lungs as needed for smoking cessation.  42 each  5  . nitroGLYCERIN (NITROSTAT) 0.4 MG SL tablet Place 1 tablet (0.4 mg total) under the tongue every 5 (five) minutes as needed. For chest pain -- may repeat times three  25 tablet  11  . rosuvastatin (CRESTOR) 20 MG tablet Take 1 tablet (20 mg total) by mouth daily.  30 tablet  11  . sildenafil (VIAGRA) 50 MG tablet Take 1 tablet (50 mg total) by mouth daily as needed.  30 tablet  0  . tiotropium (SPIRIVA HANDIHALER) 18 MCG inhalation capsule Place 1 capsule (18 mcg total) into inhaler and inhale daily.  30 capsule  12  . traMADol (ULTRAM) 50 MG tablet Take 1 tablet (50 mg total) by mouth every 6 (six) hours as needed.  45  tablet  2  . triamcinolone cream (KENALOG) 0.1 % Apply topically 2 (two) times daily.  30 g  1  . valACYclovir (VALTREX) 1000 MG tablet Take 1 tablet (1,000 mg total) by mouth 3 (three) times daily.  21 tablet  1   No current facility-administered medications on file prior to visit.   No Known Allergies   HPI HPI Comments: [name] Smith is a 53 y.o. male who presents to the [facility] complaining of planatar fasciitis in his right foot. Pt reports he has been wearing his boot at night, approximately half the time. Pt states he has some heel lifts from the podiatrist but denies using them for relief. Pt reports using ibuprofen. Pt denies using meloxicam or methocarbamol. Pt reports he has seen podiatrist.   Review of Systems  Constitutional: Negative for fever and chills.  HENT: Negative for congestion and drooling.   Respiratory: Negative  for cough and shortness of breath.   Gastrointestinal: Negative for nausea and vomiting.  Skin: Negative for color change.  All other systems reviewed and are negative.  Objective:   Physical Exam  Constitutional: He is oriented to person, place, and time. He appears well-developed.  HENT:  Head: Normocephalic and atraumatic.  Mouth/Throat: Oropharynx is clear and moist.  Eyes: Conjunctivae and EOM are normal. No scleral icterus.  Neck: Neck supple.  Cardiovascular: Normal rate.   Pulmonary/Chest: Effort normal and breath sounds normal. No respiratory distress. He has no wheezes.  Abdominal: He exhibits no distension.  Musculoskeletal: Normal range of motion. He exhibits no edema.  Right foot normal appearance.  Neurological: He is oriented to person, place, and time. Coordination normal.  Skin: Skin is warm and dry.  Psychiatric: He has a normal mood and affect. His behavior is normal.   Triage Vitals: BP 120/64  Pulse 81  Temp(Src) 98.4 F (36.9 C) (Oral)  Resp 16  Ht 5' 7.5" (1.715 m)  Wt 150 lb 12.8 oz (68.402 kg)  BMI 23.26 kg/m2  SpO2 94%  Assessment & Plan:  4:48 PM- Pt encouraged to wear heel lifts in his shoes to relieve his symptoms. Patient informed of current plan for treatment and evaluation and agrees with plan at this time.  Plantar fasciitis of right foot  Meds ordered this encounter  Medications  . HYDROcodone-acetaminophen (NORCO/VICODIN) 5-325 MG per tablet    Sig: Take 1 tablet by mouth 2 (two) times daily as needed for moderate pain.    Dispense:  60 tablet    Refill:  0    I personally performed the services described in this documentation, which was scribed in my presence. The recorded information has been reviewed and considered, and addended by me as needed.  Norberto Sorenson, MD MPH

## 2013-10-20 ENCOUNTER — Encounter: Payer: Self-pay | Admitting: Internal Medicine

## 2013-10-20 ENCOUNTER — Ambulatory Visit (INDEPENDENT_AMBULATORY_CARE_PROVIDER_SITE_OTHER): Payer: BC Managed Care – PPO | Admitting: Internal Medicine

## 2013-10-20 VITALS — BP 133/77 | HR 75 | Temp 98.4°F | Wt 152.0 lb

## 2013-10-20 DIAGNOSIS — M25569 Pain in unspecified knee: Secondary | ICD-10-CM | POA: Diagnosis not present

## 2013-10-20 DIAGNOSIS — F319 Bipolar disorder, unspecified: Secondary | ICD-10-CM

## 2013-10-20 MED ORDER — TRAMADOL HCL 50 MG PO TABS: 50.0000 mg | ORAL_TABLET | Freq: Four times a day (QID) | ORAL | Status: DC | PRN

## 2013-10-20 MED ORDER — ARIPIPRAZOLE 15 MG PO TABS: 15.0000 mg | ORAL_TABLET | Freq: Every day | ORAL | Status: DC

## 2013-10-20 NOTE — Progress Notes (Signed)
Patient ID: [name] Smith, male   DOB: [birth year], 53 y.o.   MRN: [id number]       Patient ID: [name] Smith, male   DOB: 05/17/1960, 53 y.o.   MRN: [id number]  HPI  53yo M with HIV, CD 4 count of 860/VL<20, on stribild. He states that he is doing well but  Needs refill on ability, tramadol, and hydrocodone. He mentions that the last prescription only last 1/2 the month. He did have high tolerance in the past but also received #120 tabs per month from prior provider,  but now improved not using as much pain medication. He has no insurance to go to pain clinic.   Outpatient Encounter Prescriptions as of 10/20/2013  Medication Sig  . albuterol (PROVENTIL HFA;VENTOLIN HFA) 108 (90 BASE) MCG/ACT inhaler Inhale 2 puffs into the lungs every 6 (six) hours as needed for wheezing or shortness of breath.  . ARIPiprazole (ABILIFY) 15 MG tablet Take 15 mg by mouth daily.  Marland Kitchen aspirin 325 MG EC tablet Take 325 mg by mouth daily.    Marland Kitchen buPROPion (WELLBUTRIN) 100 MG tablet Take 1 tablet (100 mg total) by mouth daily.  . clopidogrel (PLAVIX) 75 MG tablet Take 1 tablet (75 mg total) by mouth daily.  Marland Kitchen elvitegravir-cobicistat-emtricitabine-tenofovir (STRIBILD) 150-150-200-300 MG TABS tablet Take 1 tablet by mouth daily with breakfast.  . FLUoxetine (PROZAC) 10 MG capsule TAKE ONE CAPSULE BY MOUTH EVERY DAY  . folic acid (FOLVITE) 1 MG tablet TAKE 1 TABLET BY MOUTH EVERY DAY  . HYDROcodone-acetaminophen (NORCO/VICODIN) 5-325 MG per tablet Take 1 tablet by mouth 2 (two) times daily as needed for moderate pain.  Marland Kitchen ibuprofen (ADVIL,MOTRIN) 600 MG tablet Take 1 tablet (600 mg total) by mouth every 6 (six) hours as needed for pain.  . meloxicam (MOBIC) 7.5 MG tablet Take 1 tablet (7.5 mg total) by mouth daily. Prn pain  . methocarbamol (ROBAXIN) 500 MG tablet Take one in the morning, one in the afternoon, and 2 at bedtime for muscle relaxant  . metoprolol succinate (TOPROL-XL) 50 MG 24 hr tablet TAKE 1 TABLET BY  MOUTH EVERY DAY  . nicotine (NICOTROL) 10 MG inhaler Inhale 1 puff into the lungs as needed for smoking cessation.  . nitroGLYCERIN (NITROSTAT) 0.4 MG SL tablet Place 1 tablet (0.4 mg total) under the tongue every 5 (five) minutes as needed. For chest pain -- may repeat times three  . rosuvastatin (CRESTOR) 20 MG tablet Take 1 tablet (20 mg total) by mouth daily.  . sildenafil (VIAGRA) 50 MG tablet Take 1 tablet (50 mg total) by mouth daily as needed.  . tiotropium (SPIRIVA HANDIHALER) 18 MCG inhalation capsule Place 1 capsule (18 mcg total) into inhaler and inhale daily.  . traMADol (ULTRAM) 50 MG tablet Take 1 tablet (50 mg total) by mouth every 6 (six) hours as needed.  . triamcinolone cream (KENALOG) 0.1 % Apply topically 2 (two) times daily.  . valACYclovir (VALTREX) 1000 MG tablet Take 1 tablet (1,000 mg total) by mouth 3 (three) times daily.     Patient Active Problem List   Diagnosis Date Noted  . Encounter for disability assessment 06/08/2013  . Murmur 10/14/2011  . URI (upper respiratory infection) 04/29/2011  . Bruit 10/28/2010  . SMOKER 11/06/2008  . COUGH 11/06/2008  . HYPERCHOLESTEROLEMIA 11/03/2008  . ANXIETY 11/03/2008  . HYPERTENSION 11/03/2008  . GERD 11/03/2008  . NEPHROLITHIASIS 11/03/2008  . HIV INFECTION 03/20/2006  . CORONARY ARTERY DISEASE 03/20/2006  . CORONARY ARTERY BYPASS  GRAFT, THREE VESSEL, HX OF 03/20/2006     Health Maintenance Due  Topic Date Due  . Tetanus/tdap  03/04/1980  . Colonoscopy  03/05/2011  . Influenza Vaccine  10/08/2013     Review of Systems  Physical Exam   BP 133/77  Pulse 75  Temp(Src) 98.4 F (36.9 C) (Oral)  Wt 152 lb (68.947 kg)  Lab Results  Component Value Date   CD4TCELL 43 10/03/2013   Lab Results  Component Value Date   CD4TABS 860 10/03/2013   CD4TABS 1080 04/25/2013   CD4TABS 670 08/23/2012   Lab Results  Component Value Date   HIV1RNAQUANT <20 10/03/2013   Lab Results  Component Value Date   HEPBSAB  Yes 05/04/2006   No results found for this basename: RPR    CBC Lab Results  Component Value Date   WBC 6.4 10/03/2013   RBC 5.03 10/03/2013   HGB 17.1* 10/03/2013   HCT 49.3 10/03/2013   PLT 220 10/03/2013   MCV 98.0 10/03/2013   MCH 34.0 10/03/2013   MCHC 34.7 10/03/2013   RDW 13.0 10/03/2013   LYMPHSABS 1.9 10/03/2013   MONOABS 0.3 10/03/2013   EOSABS 0.1 10/03/2013   BASOSABS 0.0 10/03/2013   BMET Lab Results  Component Value Date   NA 132* 10/03/2013   K 4.2 10/03/2013   CL 97 10/03/2013   CO2 28 10/03/2013   GLUCOSE 140* 10/03/2013   BUN 12 10/03/2013   CREATININE 1.00 10/03/2013   CALCIUM 9.4 10/03/2013   GFRNONAA 86 10/03/2013   GFRAA >89 10/03/2013     Assessment and Plan  Pain management = appears that he recently received RX from his pcp. We will refill tramadol  Bipolar disease = will give him refill for abilify  hiv = well controlled  Cad/hyperlipidemia = on rosuvastatin

## 2013-11-07 ENCOUNTER — Ambulatory Visit: Payer: BC Managed Care – PPO

## 2013-11-15 ENCOUNTER — Telehealth: Payer: Self-pay | Admitting: *Deleted

## 2013-11-15 NOTE — Telephone Encounter (Signed)
Patient called and wants to get a Rx for HYDROcodone-acetaminophen (NORCO/VICODIN) 5-325 MG per tablet. Advised the patient that the provider is unavailable at this time but that will send her a message and once she responds will give him a call. Also advised the patient that it may be 11/21/13 before I get a response and he understands and advised he will call back if he does not hear from me by then.

## 2013-11-21 NOTE — Telephone Encounter (Signed)
It looks like he got it from another doctor last time. So see if he can continue with that provider

## 2013-11-29 ENCOUNTER — Ambulatory Visit: Payer: BC Managed Care – PPO

## 2013-12-13 ENCOUNTER — Telehealth: Payer: Self-pay | Admitting: *Deleted

## 2013-12-13 NOTE — Telephone Encounter (Signed)
Patient called and advised that he is having increased joint and leg pain. He advised he has not had any Hydrocodone since 10/2013 and was fine but since the change in temp he has experienced increased pain. He also complaines of pain that radiates down his back and then down his leg. I asked the patient if he has ever seen a neurologist and if he would be fine seeing a pain clinic and he advised he has never seen a neurologist but will and no he will never see a pain clinic he is not an addict and those places treat you like one. Advised the patient will have to send the doctor a note but reminded him the last time he called she advised him she will not refill and that he should continue seeing the doctor who prescribed him last at the urgent care as they also see patients as PCP's. He advised that was a one time emergency visit and does not feel comfortable. Advised him will send a message to the doctor but not sure she will fill the medication and that I will give him a call once I get an answer. Reminded him it may be next week (12/19/13) as she is in meetings until that time. He advised he is fine to wait.

## 2013-12-14 NOTE — Telephone Encounter (Signed)
That is correct and we have already phased him out. We stepped him down and introduced Tramadol in May 2015. He has asked at every visit and every time he comes in for counseling yet claims to not have a problem. I will let him know you will deal with this next week. He does not have an upcoming appt.

## 2013-12-14 NOTE — Telephone Encounter (Signed)
i will have to speak with him on Monday since we are phasing out giving narcotics to folks, right?

## 2013-12-16 ENCOUNTER — Ambulatory Visit (INDEPENDENT_AMBULATORY_CARE_PROVIDER_SITE_OTHER): Payer: BC Managed Care – PPO | Admitting: Family Medicine

## 2013-12-16 VITALS — BP 122/64 | HR 88 | Temp 98.9°F | Resp 16 | Ht 68.5 in | Wt 155.0 lb

## 2013-12-16 DIAGNOSIS — M79671 Pain in right foot: Secondary | ICD-10-CM

## 2013-12-16 DIAGNOSIS — M722 Plantar fascial fibromatosis: Secondary | ICD-10-CM

## 2013-12-16 MED ORDER — HYDROCODONE-ACETAMINOPHEN 5-325 MG PO TABS: 1.0000 | ORAL_TABLET | Freq: Every day | ORAL | Status: DC | PRN

## 2013-12-16 NOTE — Progress Notes (Signed)
Reviewed documentation and agree w/ assessment and plan. Eva [name], MD MPH 

## 2013-12-16 NOTE — Progress Notes (Signed)
Subjective:    Patient ID: [name] Smith, male    DOB: 03-Sep-1960, 53 y.o.   MRN: [id number]  Foot Injury     [name],[name], MD  Chief Complaint  Patient presents with  . Foot Injury   Patient Active Problem List   Diagnosis Date Noted  . Encounter for disability assessment 06/08/2013  . Murmur 10/14/2011  . URI (upper respiratory infection) 04/29/2011  . Bruit 10/28/2010  . SMOKER 11/06/2008  . COUGH 11/06/2008  . HYPERCHOLESTEROLEMIA 11/03/2008  . ANXIETY 11/03/2008  . HYPERTENSION 11/03/2008  . GERD 11/03/2008  . NEPHROLITHIASIS 11/03/2008  . HIV INFECTION 03/20/2006  . CORONARY ARTERY DISEASE 03/20/2006  . CORONARY ARTERY BYPASS GRAFT, THREE VESSEL, HX OF 03/20/2006   Prior to Admission medications   Medication Sig Start Date End Date Taking? Authorizing Provider  albuterol (PROVENTIL HFA;VENTOLIN HFA) 108 (90 BASE) MCG/ACT inhaler Inhale 2 puffs into the lungs every 6 (six) hours as needed for wheezing or shortness of breath. 05/17/13  Yes Judyann Munsonynthia Snider, MD  ARIPiprazole (ABILIFY) 15 MG tablet Take 1 tablet (15 mg total) by mouth daily. 10/20/13  Yes Judyann Munsonynthia Snider, MD  aspirin 325 MG EC tablet Take 325 mg by mouth daily.     Yes Historical Provider, MD  buPROPion (WELLBUTRIN) 100 MG tablet Take 1 tablet (100 mg total) by mouth daily. 06/04/12  Yes Lina Sayreimothy Lane, MD  clopidogrel (PLAVIX) 75 MG tablet Take 1 tablet (75 mg total) by mouth daily. 06/08/13  Yes Wendall StadePeter C Nishan, MD  elvitegravir-cobicistat-emtricitabine-tenofovir (STRIBILD) 150-150-200-300 MG TABS tablet Take 1 tablet by mouth daily with breakfast. 07/19/13  Yes Judyann Munsonynthia Snider, MD  ibuprofen (ADVIL,MOTRIN) 600 MG tablet Take 1 tablet (600 mg total) by mouth every 6 (six) hours as needed for pain. 05/31/12  Yes Roxy Horsemanobert Browning, PA-C  meloxicam (MOBIC) 7.5 MG tablet Take 1 tablet (7.5 mg total) by mouth daily. Prn pain 09/18/13  No Sherren MochaEva N [name], MD  metoprolol succinate (TOPROL-XL) 50 MG 24 hr tablet TAKE 1 TABLET  BY MOUTH EVERY DAY 05/06/13  Yes Judyann Munsonynthia Snider, MD  nitroGLYCERIN (NITROSTAT) 0.4 MG SL tablet Place 1 tablet (0.4 mg total) under the tongue every 5 (five) minutes as needed. For chest pain -- may repeat times three 08/11/11  Yes Lina Sayreimothy Lane, MD  rosuvastatin (CRESTOR) 20 MG tablet Take 1 tablet (20 mg total) by mouth daily. 07/19/13  Yes Judyann Munsonynthia Snider, MD  sildenafil (VIAGRA) 50 MG tablet Take 1 tablet (50 mg total) by mouth daily as needed. 07/19/13  Yes Judyann Munsonynthia Snider, MD  tiotropium (SPIRIVA HANDIHALER) 18 MCG inhalation capsule Place 1 capsule (18 mcg total) into inhaler and inhale daily. 05/17/13  Yes Judyann Munsonynthia Snider, MD  traMADol (ULTRAM) 50 MG tablet Take 1 tablet (50 mg total) by mouth every 6 (six) hours as needed. 10/20/13  Yes Judyann Munsonynthia Snider, MD  triamcinolone cream (KENALOG) 0.1 % Apply topically 2 (two) times daily. 04/12/13  Yes Judyann Munsonynthia Snider, MD  valACYclovir (VALTREX) 1000 MG tablet Take 1 tablet (1,000 mg total) by mouth 3 (three) times daily. 11/12/12  Yes Judyann Munsonynthia Snider, MD  methocarbamol (ROBAXIN) 500 MG tablet Take one in the morning, one in the afternoon, and 2 at bedtime for muscle relaxant 05/12/13   Peyton Najjaravid H Hopper, MD  nicotine (NICOTROL) 10 MG inhaler Inhale 1 puff into the lungs as needed for smoking cessation. 12/05/11   Lina Sayreimothy Lane, MD   Presents with right foot pain. He has a history of plantar fascitis in his right foot for  about the past year, and feels that this pain today is the same as his usual plantar fascitis flares. He has been seen by several providers for this, including [facility]. He has been prescribed stretching exercises, heel lifts, night splints, and different nsaids/pain medication for this in the past.  Yesterday when he awoke from sleep he stepped down to the floor and felt instant pain, specifically at the mid-bottom of his foot. He has not been using his night splint but has been wearing his heel lifts daily. He has also not  been doing his stretches. He feels that the night splint and stretches help, but he just doesn't get around to using them when the pain is not bothering him. He is currently taking his ultram 50 mg every 6 hours and ibuprofen 600 mg twice daily. He does not think he is taking his mobic. He states he has run out of his vicodin and would like some more  for the pain.    Review of Systems    No chest pain, SOB, HA, dizziness, vision change, N/V, diarrhea, constipation, dysuria, urinary urgency or frequency, myalgias, arthralgias or rash.  Objective:   Physical Exam  Constitutional: Vital signs are normal. He appears well-developed and well-nourished. He does not appear ill. No distress.  Cardiovascular: Normal rate, regular rhythm, S1 normal and S2 normal.  Exam reveals no gallop.   Murmur heard.  Systolic murmur is present with a grade of 2/6  SEM loudest at lower left sternal border.  Pulmonary/Chest: Effort normal and breath sounds normal. He has no wheezes. He has no rhonchi. He has no rales.  Musculoskeletal:       Right ankle: Normal. He exhibits normal range of motion and normal pulse. No tenderness. Achilles tendon normal. Achilles tendon exhibits no pain.       Right foot: He exhibits tenderness. He exhibits normal range of motion, no crepitus, no deformity and no laceration.  Point tenderness at center of calcaneous on plantar aspect. 4/5 strength both plantar and dorsiflexion right ankle, limited due to discomfort with movements.       Assessment & Plan:   89 yom with known plantar fascitis who has been treated here in the past presents with acute flare starting yesterday morning.   Foot pain - right  Presentation most likely plantar fascitis with history and with presenting symptoms of pain mid foot plantar aspect upon stepping down from bed in the morning. Home exercises/stretches reviewed, encouraged. Encouraged starting night splint again. Encouraged to continue using  daytime heel lift in shoes.  Continue with home regimen of ibuprofen. Use tramadol as needed for pain.  [place] app[place]iat[place] in [place] on 11/18. [place] Controlled Substances Database [place]. He has received two hydrocodone prescriptions, 30 tabs on 09/18/13 and 60 tabs on 10/09/13. Both of these were written from our office for his right foot plantar fascitis.  He is also receiving tramadol 50 mg, 45 tabs per month, from a different provider.  Giving prescription for 30 tabs vicodin for the pain. This will need to last him until his appt with podiatry on 01/25/14. Reviewed with patient that we are an urgent care clinic to use for acute needs. If the pain continues to bother him in his right foot he needs to follow up with his PCP/podiatrist for a long-term plan for pain control. He expresses understanding and is agreeable with this plan. Patient instructions given.

## 2013-12-16 NOTE — Patient Instructions (Addendum)
Plantar Fasciitis (Heel Spur Syndrome) with Rehab The plantar fascia is a fibrous, ligament-like, soft-tissue structure that spans the bottom of the foot. Plantar fasciitis is a condition that causes pain in the foot due to inflammation of the tissue. SYMPTOMS   Pain and tenderness on the underneath side of the foot.  Pain that worsens with standing or walking. CAUSES  Plantar fasciitis is caused by irritation and injury to the plantar fascia on the underneath side of the foot. Common mechanisms of injury include:  Direct trauma to bottom of the foot.  Damage to a small nerve that runs under the foot where the main fascia attaches to the heel bone.  Stress placed on the plantar fascia due to bone spurs. RISK INCREASES WITH:   Activities that place stress on the plantar fascia (running, jumping, pivoting, or cutting).  Poor strength and flexibility.  Improperly fitted shoes.  Tight calf muscles.  Flat feet.  Failure to warm-up properly before activity.  Obesity. PREVENTION  Warm up and stretch properly before activity.  Allow for adequate recovery between workouts.  Maintain physical fitness:  Strength, flexibility, and endurance.  Cardiovascular fitness.  Maintain a health body weight.  Avoid stress on the plantar fascia.  Wear properly fitted shoes, including arch supports for individuals who have flat feet. PROGNOSIS  If treated properly, then the symptoms of plantar fasciitis usually resolve without surgery. However, occasionally surgery is necessary. RELATED COMPLICATIONS   Recurrent symptoms that may result in a chronic condition.  Problems of the lower back that are caused by compensating for the injury, such as limping.  Pain or weakness of the foot during push-off following surgery.  Chronic inflammation, scarring, and partial or complete fascia tear, occurring more often from repeated injections. TREATMENT  Treatment initially involves the use of  ice and medication to help reduce pain and inflammation. The use of strengthening and stretching exercises may help reduce pain with activity, especially stretches of the Achilles tendon. These exercises may be performed at home or with a therapist. Your caregiver may recommend that you use heel cups of arch supports to help reduce stress on the plantar fascia. Occasionally, corticosteroid injections are given to reduce inflammation. If symptoms persist for greater than 6 months despite non-surgical (conservative), then surgery may be recommended.  MEDICATION   If pain medication is necessary, then nonsteroidal anti-inflammatory medications, such as aspirin and ibuprofen, or other minor pain relievers, such as acetaminophen, are often recommended.  Do not take pain medication within 7 days before surgery.  Prescription pain relievers may be given if deemed necessary by your caregiver. Use only as directed and only as much as you need.  Corticosteroid injections may be given by your caregiver. These injections should be reserved for the most serious cases, because they may only be given a certain number of times. HEAT AND COLD  Cold treatment (icing) relieves pain and reduces inflammation. Cold treatment should be applied for 10 to 15 minutes every 2 to 3 hours for inflammation and pain and immediately after any activity that aggravates your symptoms. Use ice packs or massage the area with a piece of ice (ice massage).  Heat treatment may be used prior to performing the stretching and strengthening activities prescribed by your caregiver, physical therapist, or athletic trainer. Use a heat pack or soak the injury in warm water. SEEK IMMEDIATE MEDICAL CARE IF:  Treatment seems to offer no benefit, or the condition worsens.  Any medications produce adverse side effects. EXERCISES RANGE   OF MOTION (ROM) AND STRETCHING EXERCISES - Plantar Fasciitis (Heel Spur Syndrome) These exercises may help you  when beginning to rehabilitate your injury. Your symptoms may resolve with or without further involvement from your physician, physical therapist or athletic trainer. While completing these exercises, remember:   Restoring tissue flexibility helps normal motion to return to the joints. This allows healthier, less painful movement and activity.  An effective stretch should be held for at least 30 seconds.  A stretch should never be painful. You should only feel a gentle lengthening or release in the stretched tissue. RANGE OF MOTION - Toe Extension, Flexion  Sit with your right / left leg crossed over your opposite knee.  Grasp your toes and gently pull them back toward the top of your foot. You should feel a stretch on the bottom of your toes and/or foot.  Hold this stretch for _____15_____ seconds.  Now, gently pull your toes toward the bottom of your foot. You should feel a stretch on the top of your toes and or foot.  Hold this stretch for ____15______ seconds. Repeat _____2_____ times. Complete this stretch _____2_____ times per day.  RANGE OF MOTION - Ankle Dorsiflexion, Active Assisted  Remove shoes and sit on a chair that is preferably not on a carpeted surface.  Place right / left foot under knee. Extend your opposite leg for support.  Keeping your heel down, slide your right / left foot back toward the chair until you feel a stretch at your ankle or calf. If you do not feel a stretch, slide your bottom forward to the edge of the chair, while still keeping your heel down.  Hold this stretch for ____15______ seconds. Repeat __2________ times. Complete this stretch _____2_____ times per day.  STRETCH - Gastroc, Standing  Place hands on wall.  Extend right / left leg, keeping the front knee somewhat bent.  Slightly point your toes inward on your back foot.  Keeping your right / left heel on the floor and your knee straight, shift your weight toward the wall, not allowing  your back to arch.  You should feel a gentle stretch in the right / left calf. Hold this position for ____15______ seconds. Repeat ___2_______ times. Complete this stretch ______2____ times per day. STRETCH - Soleus, Standing  Place hands on wall.  Extend right / left leg, keeping the other knee somewhat bent.  Slightly point your toes inward on your back foot.  Keep your right / left heel on the floor, bend your back knee, and slightly shift your weight over the back leg so that you feel a gentle stretch deep in your back calf.  Hold this position for ____15______ seconds. Repeat _____2_____ times. Complete this stretch _____2_____ times per day. STRETCH - Gastrocsoleus, Standing  Note: This exercise can place a lot of stress on your foot and ankle. Please complete this exercise only if specifically instructed by your caregiver.   Place the ball of your right / left foot on a step, keeping your other foot firmly on the same step.  Hold on to the wall or a rail for balance.  Slowly lift your other foot, allowing your body weight to press your heel down over the edge of the step.  You should feel a stretch in your right / left calf.  Hold this position for ____15______ seconds.  Repeat this exercise with a slight bend in your right / left knee. Repeat ___2_______ times. Complete this stretch ____2______ times per day.  STRENGTHENING EXERCISES - Plantar Fasciitis (Heel Spur Syndrome)  These exercises may help you when beginning to rehabilitate your injury. They may resolve your symptoms with or without further involvement from your physician, physical therapist or athletic trainer. While completing these exercises, remember:   Muscles can gain both the endurance and the strength needed for everyday activities through controlled exercises.  Complete these exercises as instructed by your physician, physical therapist or athletic trainer. Progress the resistance and repetitions only as  guided. STRENGTH - Towel Curls  Sit in a chair positioned on a non-carpeted surface.  Place your foot on a towel, keeping your heel on the floor.  Pull the towel toward your heel by only curling your toes. Keep your heel on the floor.  If instructed by your physician, physical therapist or athletic trainer, add ____________________ at the end of the towel. Repeat ____2______ times. Complete this exercise _____2_____ times per day. STRENGTH - Ankle Inversion  Secure one end of a rubber exercise band/tubing to a fixed object (table, pole). Loop the other end around your foot just before your toes.  Place your fists between your knees. This will focus your strengthening at your ankle.  Slowly, pull your big toe up and in, making sure the band/tubing is positioned to resist the entire motion.  Hold this position for ____15______ seconds.  Have your muscles resist the band/tubing as it slowly pulls your foot back to the starting position. Repeat _____2_____ times. Complete this exercises _____2_____ times per day.  Document Released: 02/24/2005 Document Revised: 05/19/2011 Document Reviewed: 06/08/2008 [facility] Patient Information 2015 [place]. This information is not intended to replace advice given to you by your health care provider. Make sure you discuss any questions you have with your health care provider.  Please use your night splint every night to help prevent the flares. Please continue to wear your heel lifts daily for support.  Please do these stretches daily to help prevent these flares.  Please continue to use your ibuprofen 600 mg every 6 hours for the next week. This will help to keep the inflammation down.  I have written you a prescription for vicodin for the pain for 30 days. This will have to last you until you see the podiatrist on 01/25/14. You will need to work out a long-term plan with your PCP/podiatrist moving forward for pain if your foot continues to  bother you.

## 2013-12-19 ENCOUNTER — Ambulatory Visit: Payer: BC Managed Care – PPO | Admitting: Podiatry

## 2013-12-21 ENCOUNTER — Other Ambulatory Visit: Payer: Self-pay | Admitting: Family Medicine

## 2013-12-23 ENCOUNTER — Other Ambulatory Visit: Payer: Self-pay

## 2014-01-03 ENCOUNTER — Ambulatory Visit: Payer: BC Managed Care – PPO

## 2014-01-03 ENCOUNTER — Ambulatory Visit (INDEPENDENT_AMBULATORY_CARE_PROVIDER_SITE_OTHER): Payer: BC Managed Care – PPO | Admitting: Internal Medicine

## 2014-01-03 VITALS — BP 134/84 | HR 99 | Temp 98.3°F | Wt 155.0 lb

## 2014-01-03 DIAGNOSIS — J45901 Unspecified asthma with (acute) exacerbation: Secondary | ICD-10-CM | POA: Diagnosis not present

## 2014-01-03 DIAGNOSIS — E785 Hyperlipidemia, unspecified: Secondary | ICD-10-CM | POA: Diagnosis not present

## 2014-01-03 DIAGNOSIS — B20 Human immunodeficiency virus [HIV] disease: Secondary | ICD-10-CM | POA: Diagnosis not present

## 2014-01-03 MED ORDER — ROSUVASTATIN CALCIUM 20 MG PO TABS: 20.0000 mg | ORAL_TABLET | Freq: Every day | ORAL | Status: DC

## 2014-01-03 MED ORDER — AZITHROMYCIN 250 MG PO TABS: ORAL_TABLET | ORAL | Status: DC

## 2014-01-03 MED ORDER — ELVITEG-COBIC-EMTRICIT-TENOFDF 150-150-200-300 MG PO TABS: 1.0000 | ORAL_TABLET | Freq: Every day | ORAL | Status: DC

## 2014-01-03 NOTE — Progress Notes (Signed)
Patient ID: [name] Smith, male   DOB: 1960/04/13, 53 y.o.   MRN: [id number]          Patient Active Problem List   Diagnosis Date Noted  . Asthmatic bronchitis with acute exacerbation 01/03/2014  . Encounter for disability assessment 06/08/2013  . Murmur 10/14/2011  . URI (upper respiratory infection) 04/29/2011  . Bruit 10/28/2010  . SMOKER 11/06/2008  . COUGH 11/06/2008  . HYPERCHOLESTEROLEMIA 11/03/2008  . ANXIETY 11/03/2008  . HYPERTENSION 11/03/2008  . GERD 11/03/2008  . NEPHROLITHIASIS 11/03/2008  . HIV INFECTION 03/20/2006  . CORONARY ARTERY DISEASE 03/20/2006  . CORONARY ARTERY BYPASS GRAFT, THREE VESSEL, HX OF 03/20/2006    Patient's Medications  New Prescriptions   AZITHROMYCIN (ZITHROMAX Z-PAK) 250 MG TABLET    Take by mouth as directed.  Previous Medications   ALBUTEROL (PROVENTIL HFA;VENTOLIN HFA) 108 (90 BASE) MCG/ACT INHALER    Inhale 2 puffs into the lungs every 6 (six) hours as needed for wheezing or shortness of breath.   ARIPIPRAZOLE (ABILIFY) 15 MG TABLET    Take 1 tablet (15 mg total) by mouth daily.   ASPIRIN 325 MG EC TABLET    Take 325 mg by mouth daily.     BUPROPION (WELLBUTRIN) 100 MG TABLET    Take 1 tablet (100 mg total) by mouth daily.   CLOPIDOGREL (PLAVIX) 75 MG TABLET    Take 1 tablet (75 mg total) by mouth daily.   HYDROCODONE-ACETAMINOPHEN (NORCO/VICODIN) 5-325 MG PER TABLET    Take 1 tablet by mouth daily as needed for moderate pain.   IBUPROFEN (ADVIL,MOTRIN) 600 MG TABLET    Take 1 tablet (600 mg total) by mouth every 6 (six) hours as needed for pain.   MELOXICAM (MOBIC) 7.5 MG TABLET    Take 1 tablet (7.5 mg total) by mouth daily. Prn pain   METHOCARBAMOL (ROBAXIN) 500 MG TABLET    Take one in the morning, one in the afternoon, and 2 at bedtime for muscle relaxant   METOPROLOL SUCCINATE (TOPROL-XL) 50 MG 24 HR TABLET    TAKE 1 TABLET BY MOUTH EVERY DAY   NICOTINE (NICOTROL) 10 MG INHALER    Inhale 1 puff into the lungs as needed for  smoking cessation.   NITROGLYCERIN (NITROSTAT) 0.4 MG SL TABLET    Place 1 tablet (0.4 mg total) under the tongue every 5 (five) minutes as needed. For chest pain -- may repeat times three   SILDENAFIL (VIAGRA) 50 MG TABLET    Take 1 tablet (50 mg total) by mouth daily as needed.   TIOTROPIUM (SPIRIVA HANDIHALER) 18 MCG INHALATION CAPSULE    Place 1 capsule (18 mcg total) into inhaler and inhale daily.   TRAMADOL (ULTRAM) 50 MG TABLET    Take 1 tablet (50 mg total) by mouth every 6 (six) hours as needed.   TRIAMCINOLONE CREAM (KENALOG) 0.1 %    Apply topically 2 (two) times daily.   VALACYCLOVIR (VALTREX) 1000 MG TABLET    Take 1 tablet (1,000 mg total) by mouth 3 (three) times daily.  Modified Medications   Modified Medication Previous Medication   ELVITEGRAVIR-COBICISTAT-EMTRICITABINE-TENOFOVIR (STRIBILD) 150-150-200-300 MG TABS TABLET elvitegravir-cobicistat-emtricitabine-tenofovir (STRIBILD) 150-150-200-300 MG TABS tablet      Take 1 tablet by mouth daily with breakfast.    Take 1 tablet by mouth daily with breakfast.   ROSUVASTATIN (CRESTOR) 20 MG TABLET rosuvastatin (CRESTOR) 20 MG tablet      Take 1 tablet (20 mg total) by mouth daily.  Take 1 tablet (20 mg total) by mouth daily.  Discontinued Medications   No medications on file    Subjective: [name] Smith was seen on a work in basis. He recently developed sinus congestion about 2 weeks ago. Initially, he thought it was due to ragweed allergies but he did not get any relief from Sudafed, using his Nettie pot, humidifier or eucalyptus. He's had some worsening of his chronic smoker's cough with sputum that is yellow-green. His chronic dyspnea on exertion is a little worse. He has had some subjective fevers with mild chills and sweats. He recently ran out of his Stribild but started taking his old supply of Atripla. He has not missed any doses. He has no current plan to quit smoking cigarettes. Review of Systems: Pertinent items are noted in  HPI.  Past Medical History  Diagnosis Date  . Hypertension   . Coronary atherosclerosis of unspecified type of vessel, native or graft   . Pure hypercholesterolemia   . Need for prophylactic vaccination and inoculation against influenza   . Human immunodeficiency virus (HIV) disease   . Calculus of kidney   . Esophageal reflux   . Anxiety state, unspecified     History  Substance Use Topics  . Smoking status: Current Every Day Smoker -- 1.00 packs/day for 36 years    Types: Cigarettes  . Smokeless tobacco: Never Used  . Alcohol Use: 3.5 oz/week    7 drink(s) per week     Comment: wine    Family History  Problem Relation Age of Onset  . Arthritis Mother     No Known Allergies  Objective: Temp: 98.3 F (36.8 C) (10/27 1433) Temp Source: Oral (10/27 1433) BP: 134/84 mmHg (10/27 1433) Pulse Rate: 99 (10/27 1433) Body mass index is 23.22 kg/(m^2).  General: He sounds congested Oral: No oropharyngeal lesions Skin: No rash Lungs: He wheezy cough but otherwise his lungs are clear when he is not coughing Cor: Regular S1 and S2 with an early 1/6 systolic murmur  Lab Results Lab Results  Component Value Date   WBC 6.4 10/03/2013   HGB 17.1* 10/03/2013   HCT 49.3 10/03/2013   MCV 98.0 10/03/2013   PLT 220 10/03/2013    Lab Results  Component Value Date   CREATININE 1.00 10/03/2013   BUN 12 10/03/2013   NA 132* 10/03/2013   K 4.2 10/03/2013   CL 97 10/03/2013   CO2 28 10/03/2013    Lab Results  Component Value Date   ALT 20 10/03/2013   AST 19 10/03/2013   ALKPHOS 59 10/03/2013   BILITOT 0.6 10/03/2013    Lab Results  Component Value Date   CHOL 222* 04/25/2013   HDL 58 04/25/2013   LDLCALC 138* 04/25/2013   LDLDIRECT 143.2 04/23/2010   TRIG 130 04/25/2013   CHOLHDL 3.8 04/25/2013    Lab Results HIV 1 RNA Quant (copies/mL)  Date Value  10/03/2013 <20   04/25/2013 <20   08/23/2012 <20      CD4 T Cell Abs (/uL)  Date Value  10/03/2013 860   04/25/2013 1080     08/23/2012 670      Assessment: He has an acute exacerbation of COPD with asthmatic bronchitis and possible sinusitis. He does not have clinical evidence of pneumonia. I will treat him with Afrin nasal spray and a Z-Pak. I have encouraged him to strongly consider quitting cigarettes.  We have refilled his Stribild.  Plan: 1. Change Atripla back to Stribild 2. Cigarette cessation  counseling provided 3. Afrin nasal spray 2 puffs in each nostril twice daily for 4-5 days 4. Z-Pak 5. Follow-up with Dr. Drue Second on November 10   Cliffton Asters, MD [facility] [phone] pager   [phone] cell 01/03/2014, 3:06 PM

## 2014-01-04 ENCOUNTER — Telehealth: Payer: Self-pay | Admitting: *Deleted

## 2014-01-04 NOTE — Telephone Encounter (Signed)
Patient called and advised that he started the Zpack yesterday and this morning he woke with a red blotchy rash from his neck to his knees, front and back. I asked the patient if he is having any shortness of breath, tightening of the throat or trouble swallowing or tongue swelling and he denies any of these symptoms. The patient asked if he should take a Benadryl and I advised to do so and that I will notify the doctor and call him back once I get an answer. I did also advise him that if in the mean time he gets worse or starts having any chest tightness, throat or tongue swelling, or shortness of breath to call 911 and get to the ED asap.

## 2014-01-05 NOTE — Telephone Encounter (Addendum)
Per Dr Luciana Axeomer called the patient and advised him to not take the Z pack anylonger and use Benadryl to control the itching. Also advised the patient rash can take anywhere from 3 days to 2 weeks to clear up and may get worse before it gets better. The patient advised he is going to see his PCP on tomorrow 01/05/14 and I advised him probably a good idea.   Per Dr Luciana Axeomer will add Azithromycin to the patient Allergy list.

## 2014-01-05 NOTE — Addendum Note (Signed)
Addended by: Lurlean LeydenPOOLE, TRAVIS F on: 01/05/2014 08:55 AM   Modules accepted: Orders

## 2014-01-09 ENCOUNTER — Encounter: Payer: Self-pay | Admitting: Podiatry

## 2014-01-09 ENCOUNTER — Ambulatory Visit (INDEPENDENT_AMBULATORY_CARE_PROVIDER_SITE_OTHER): Payer: BC Managed Care – PPO

## 2014-01-09 ENCOUNTER — Ambulatory Visit (INDEPENDENT_AMBULATORY_CARE_PROVIDER_SITE_OTHER): Payer: BC Managed Care – PPO | Admitting: Podiatry

## 2014-01-09 VITALS — BP 126/79 | HR 99 | Resp 16

## 2014-01-09 DIAGNOSIS — L600 Ingrowing nail: Secondary | ICD-10-CM

## 2014-01-09 DIAGNOSIS — M779 Enthesopathy, unspecified: Secondary | ICD-10-CM

## 2014-01-09 DIAGNOSIS — L84 Corns and callosities: Secondary | ICD-10-CM

## 2014-01-09 NOTE — Patient Instructions (Signed)

## 2014-01-09 NOTE — Progress Notes (Signed)
   Subjective:    Patient ID: [name] Smith, male    DOB: December 05, 1960, 53 y.o.   MRN: [id number]  HPI Comments: "I have all these calluses"  Patient c/o tender callused areas plantar bilateral for several years. He trims occasionally. They get really painful when walking. Wants a long term solution.  Also, tenderness along the medial border of 1st toe left.     Review of Systems  HENT: Positive for sinus pressure and sneezing.   Respiratory: Positive for apnea and wheezing.   Genitourinary: Positive for frequency.  Musculoskeletal: Positive for back pain and gait problem.  Neurological: Positive for weakness.  Hematological: Bruises/bleeds easily.  All other systems reviewed and are negative.      Objective:   Physical Exam        Assessment & Plan:

## 2014-01-10 ENCOUNTER — Other Ambulatory Visit: Payer: BC Managed Care – PPO

## 2014-01-10 DIAGNOSIS — E785 Hyperlipidemia, unspecified: Secondary | ICD-10-CM

## 2014-01-10 DIAGNOSIS — B20 Human immunodeficiency virus [HIV] disease: Secondary | ICD-10-CM

## 2014-01-10 LAB — COMPLETE METABOLIC PANEL WITH GFR
ALT: 54 U/L — ABNORMAL HIGH (ref 0–53)
AST: 43 U/L — ABNORMAL HIGH (ref 0–37)
Albumin: 3.7 g/dL (ref 3.5–5.2)
Alkaline Phosphatase: 269 U/L — ABNORMAL HIGH (ref 39–117)
BUN: 13 mg/dL (ref 6–23)
CO2: 28 mEq/L (ref 19–32)
Calcium: 9 mg/dL (ref 8.4–10.5)
Chloride: 102 mEq/L (ref 96–112)
Creat: 0.9 mg/dL (ref 0.50–1.35)
GFR, Est African American: >89 mL/min
GFR, Est Non African American: >89 mL/min
Glucose, Bld: 140 mg/dL — ABNORMAL HIGH (ref 70–99)
Potassium: 4.9 mEq/L (ref 3.5–5.3)
Sodium: 137 mEq/L (ref 135–145)
Total Bilirubin: 0.4 mg/dL (ref 0.2–1.2)
Total Protein: 7 g/dL (ref 6.0–8.3)

## 2014-01-10 LAB — CBC WITH DIFFERENTIAL/PLATELET
Basophils Absolute: 0 10*3/uL (ref 0.0–0.1)
Basophils Relative: 0 % (ref 0–1)
Eosinophils Absolute: 0.1 10*3/uL (ref 0.0–0.7)
Eosinophils Relative: 1 % (ref 0–5)
HCT: 40.6 % (ref 39.0–52.0)
Hemoglobin: 13.9 g/dL (ref 13.0–17.0)
Lymphocytes Relative: 27 % (ref 12–46)
Lymphs Abs: 2.1 10*3/uL (ref 0.7–4.0)
MCH: 32.9 pg (ref 26.0–34.0)
MCHC: 34.2 g/dL (ref 30.0–36.0)
MCV: 96 fL (ref 78.0–100.0)
Monocytes Absolute: 0.5 10*3/uL (ref 0.1–1.0)
Monocytes Relative: 6 % (ref 3–12)
Neutro Abs: 5.1 10*3/uL (ref 1.7–7.7)
Neutrophils Relative %: 66 % (ref 43–77)
Platelets: 467 10*3/uL — ABNORMAL HIGH (ref 150–400)
RBC: 4.23 MIL/uL (ref 4.22–5.81)
RDW: 12.8 % (ref 11.5–15.5)
WBC: 7.8 10*3/uL (ref 4.0–10.5)

## 2014-01-10 LAB — LIPID PANEL
Cholesterol: 155 mg/dL (ref 0–200)
HDL: 39 mg/dL — ABNORMAL LOW (ref >39)
LDL Cholesterol: 88 mg/dL (ref 0–99)
Total CHOL/HDL Ratio: 4 Ratio
Triglycerides: 140 mg/dL (ref <150)
VLDL: 28 mg/dL (ref 0–40)

## 2014-01-10 NOTE — Progress Notes (Signed)
Subjective:     Patient ID: [name] Smith, male   DOB: 04-09-1960, 53 y.o.   MRN: [id number]  HPIpatient presents with painful ingrown toenail deformity left big toe severe lesions on the plantar aspect of both feet that he cannot cut and generalized foot structural pain. States that he needs to be on his feet and that he needs to be able to walk and is not able to do it appropriately   Review of Systems  All other systems reviewed and are negative.      Objective:   Physical Exam  Constitutional: He is oriented to person, place, and time.  Cardiovascular: Intact distal pulses.   Musculoskeletal: Normal range of motion.  Neurological: He is oriented to person, place, and time.  Skin: Skin is warm and dry.  Nursing note and vitals reviewed. neurovascular status intact with muscle strength adequate and range of motion subtalar and midtarsal joint within normal limits. Patient is noted to have good digital perfusion and is well oriented 3 with mild equinus condition noted and is noted to have incurvated hallux nail medial border that's painful when pressed. Patient is noted to have severe lesion sub-first fourth tarsals of both feet that are very painful when pressed with nucleated-type core is noted     Assessment:     Severe skin type issues with keratotic lesion sub-14 of both feet and heels and incurvated nailbed left hallux medial border that's painful when pressed    Plan:     H&P and conditions discussed with patient. Along with x-rays. Today deep debridement of lesions on both feet was accomplished and I did scanned for custom orthotics to try to reduce the pressure against the area. I then went ahead and I discussed correction of the ingrown explaining risk and he wants procedure. I infiltrated 60 mg Xylocaine Marcaine mixture remove the medial border exposed the matrix and applied phenol 3 applications 30 seconds followed by alcohol lavaged and sterile dressing. Gave  instructions on soaks and reappoint when orthotics are ready or earlier if necessary

## 2014-01-11 LAB — T-HELPER CELL (CD4) - (RCID CLINIC ONLY)
CD4 % Helper T Cell: 44 % (ref 33–55)
CD4 T Cell Abs: 880 /uL (ref 400–2700)

## 2014-01-13 LAB — HIV-1 RNA QUANT-NO REFLEX-BLD
HIV 1 RNA Quant: <20 copies/mL (ref <20)
HIV-1 RNA Quant, Log: <1.3 {Log} (ref <1.30)

## 2014-01-17 ENCOUNTER — Encounter: Payer: Self-pay | Admitting: Internal Medicine

## 2014-01-17 ENCOUNTER — Ambulatory Visit (INDEPENDENT_AMBULATORY_CARE_PROVIDER_SITE_OTHER): Payer: BC Managed Care – PPO | Admitting: Internal Medicine

## 2014-01-17 ENCOUNTER — Other Ambulatory Visit (HOSPITAL_COMMUNITY): Payer: Self-pay | Admitting: Cardiology

## 2014-01-17 VITALS — BP 133/84 | HR 99 | Temp 97.3°F | Wt 156.0 lb

## 2014-01-17 DIAGNOSIS — B20 Human immunodeficiency virus [HIV] disease: Secondary | ICD-10-CM | POA: Diagnosis not present

## 2014-01-17 DIAGNOSIS — M722 Plantar fascial fibromatosis: Secondary | ICD-10-CM

## 2014-01-17 DIAGNOSIS — M5440 Lumbago with sciatica, unspecified side: Secondary | ICD-10-CM | POA: Diagnosis not present

## 2014-01-17 DIAGNOSIS — I6523 Occlusion and stenosis of bilateral carotid arteries: Secondary | ICD-10-CM

## 2014-01-17 MED ORDER — HYDROCODONE-ACETAMINOPHEN 5-325 MG PO TABS: 1.0000 | ORAL_TABLET | Freq: Every day | ORAL | Status: DC | PRN

## 2014-01-17 NOTE — Progress Notes (Signed)
Patient ID: [name] DiamondMichael K Luck, male   DOB: 1961-01-31, 53 y.o.   MRN: [id number]       Patient ID: [name] Smith, male   DOB: 1961-01-31, 53 y.o.   MRN: [id number]  HPI 53yo M with long standing hiv disease, CD 4 count of 880/VL<20 on stribild. Doing well with medication adherence. He did have recent URI treated with azithromycin, but developed pruritic macular-papular rash. He also was seen by podiatry for plantar fasciitis, foot lesions, and ingrown toenail. He feels much improve but still has discomfort to soles of feet and is waiting to get shoe inserts.   He is still has long standing back pain, sciatica like? Radiating down back bilaterally down both legs. 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  He previously received pain medications from his prior HIV provider, and reported that he weaned himself down from high doses. Now receives them PRN, however, he has had them dispensed in the month of aug, sept and oct. He had received pain medications up until last prescribption of vicodin was 10/9 for #30. He is now out of his medications. We did do a trial of tramadol which he felt was ineffective in sept.   Outpatient Encounter Prescriptions as of 01/17/2014  Medication Sig  . albuterol (PROVENTIL HFA;VENTOLIN HFA) 108 (90 BASE) MCG/ACT inhaler Inhale 2 puffs into the lungs every 6 (six) hours as needed for wheezing or shortness of breath.  . ARIPiprazole (ABILIFY) 15 MG tablet Take 1 tablet (15 mg total) by mouth daily.  Marland Kitchen. aspirin 325 MG EC tablet Take 325 mg by mouth daily.    Marland Kitchen. buPROPion (WELLBUTRIN) 100 MG tablet Take 1 tablet (100 mg total) by mouth daily.  . clopidogrel (PLAVIX) 75 MG tablet Take 1 tablet (75 mg total) by mouth daily.  Marland Kitchen. elvitegravir-cobicistat-emtricitabine-tenofovir (STRIBILD) 150-150-200-300 MG TABS tablet Take 1 tablet by mouth daily with breakfast.  . HYDROcodone-acetaminophen  (NORCO/VICODIN) 5-325 MG per tablet Take 1 tablet by mouth daily as needed for moderate pain.  Marland Kitchen. ibuprofen (ADVIL,MOTRIN) 600 MG tablet Take 1 tablet (600 mg total) by mouth every 6 (six) hours as needed for pain.  . meloxicam (MOBIC) 7.5 MG tablet Take 1 tablet (7.5 mg total) by mouth daily. Prn pain  . methocarbamol (ROBAXIN) 500 MG tablet Take one in the morning, one in the afternoon, and 2 at bedtime for muscle relaxant  . metoprolol succinate (TOPROL-XL) 50 MG 24 hr tablet TAKE 1 TABLET BY MOUTH EVERY DAY  . nicotine (NICOTROL) 10 MG inhaler Inhale 1 puff into the lungs as needed for smoking cessation.  . nitroGLYCERIN (NITROSTAT) 0.4 MG SL tablet Place 1 tablet (0.4 mg total) under the tongue every 5 (five) minutes as needed. For chest pain -- may repeat times three  . rosuvastatin (CRESTOR) 20 MG tablet Take 1 tablet (20 mg total) by mouth daily.  . sildenafil (VIAGRA) 50 MG tablet Take 1 tablet (50 mg total) by mouth daily as needed.  . tiotropium (SPIRIVA HANDIHALER) 18 MCG inhalation capsule Place 1 capsule (18 mcg total) into inhaler and inhale daily.  Marland Kitchen. triamcinolone cream (KENALOG) 0.1 % Apply topically 2 (two) times daily.  . valACYclovir (VALTREX) 1000 MG tablet Take 1 tablet (1,000 mg total) by mouth 3 (three) times daily.  . [DISCONTINUED] traMADol (ULTRAM) 50 MG tablet Take 1 tablet (50 mg total) by mouth every 6 (six) hours as needed.  Patient Active Problem List   Diagnosis Date Noted  . Asthmatic bronchitis with acute exacerbation 01/03/2014  . Encounter for disability assessment 06/08/2013  . Murmur 10/14/2011  . URI (upper respiratory infection) 04/29/2011  . Bruit 10/28/2010  . SMOKER 11/06/2008  . COUGH 11/06/2008  . HYPERCHOLESTEROLEMIA 11/03/2008  . ANXIETY 11/03/2008  . HYPERTENSION 11/03/2008  . GERD 11/03/2008  . NEPHROLITHIASIS 11/03/2008  . HIV INFECTION 03/20/2006  . CORONARY ARTERY DISEASE 03/20/2006  . CORONARY ARTERY BYPASS GRAFT, THREE VESSEL, HX  OF 03/20/2006     Health Maintenance Due  Topic Date Due  . TETANUS/TDAP  03/04/1980  . COLONOSCOPY  03/05/2011  . INFLUENZA VACCINE  10/08/2013     Review of Systems + back pain and left foot pain, resolved shortness of breath from recent uri Physical Exam   BP 133/84 mmHg  Pulse 99  Temp(Src) 97.3 F (36.3 C) (Oral)  Wt 156 lb (70.761 kg)  SpO2 97% Physical Exam  Constitutional: He is oriented to person, place, and time. He appears well-developed and well-nourished. No distress.  HENT:  Mouth/Throat: Oropharynx is clear and moist. No oropharyngeal exudate.  Cardiovascular: Normal rate, regular rhythm and normal heart sounds. Exam reveals no gallop and no friction rub.  No murmur heard.  Pulmonary/Chest: Effort normal and breath sounds normal. No respiratory distress. He has no wheezes.  Abdominal: Soft. Bowel sounds are normal. He exhibits no distension. There is no tenderness.  Lymphadenopathy:  He has no cervical adenopathy.  Neurological: He is alert and oriented to person, place, and time.  Skin: Skin is warm and dry. No rash noted. No erythema.  Psychiatric: He has a normal mood and affect. His behavior is normal.    Lab Results  Component Value Date   CD4TCELL 44 01/10/2014   Lab Results  Component Value Date   CD4TABS 880 01/10/2014   CD4TABS 860 10/03/2013   CD4TABS 1080 04/25/2013   Lab Results  Component Value Date   HIV1RNAQUANT <20 01/10/2014   Lab Results  Component Value Date   HEPBSAB Yes 05/04/2006   No results found for: RPR  CBC Lab Results  Component Value Date   WBC 7.8 01/10/2014   RBC 4.23 01/10/2014   HGB 13.9 01/10/2014   HCT 40.6 01/10/2014   PLT 467* 01/10/2014   MCV 96.0 01/10/2014   MCH 32.9 01/10/2014   MCHC 34.2 01/10/2014   RDW 12.8 01/10/2014   LYMPHSABS 2.1 01/10/2014   MONOABS 0.5 01/10/2014   EOSABS 0.1 01/10/2014   BASOSABS 0.0 01/10/2014   BMET Lab Results  Component Value Date   NA 137 01/10/2014   K  4.9 01/10/2014   CL 102 01/10/2014   CO2 28 01/10/2014   GLUCOSE 140* 01/10/2014   BUN 13 01/10/2014   CREATININE 0.90 01/10/2014   CALCIUM 9.0 01/10/2014   GFRNONAA >89 01/10/2014   GFRAA >89 01/10/2014     Assessment and Plan   Low back pain = will give rx for # 30 vicodin. Will have him see pain management, refer to dr. Loraine LericheMark phillips at [facility] for assistance  hiv = well controlled, continue on curren regimen   copd = appears to decreasing smoking with using nicotine patch but still occ using vapor inhalers.needs refill. Consider sending to pulmonary for repeat pft  Cad = seeing cardiology in early dec  Health maintenance = will need to call to have him come in for flu and pneumococcal

## 2014-01-18 ENCOUNTER — Telehealth: Payer: Self-pay | Admitting: *Deleted

## 2014-01-18 NOTE — Telephone Encounter (Signed)
Per Dr Drue SecondSnider [facility] and called to let them know it was coming. I was advised they will review the information and call the patient and/or us if appropriate.

## 2014-01-19 ENCOUNTER — Telehealth: Payer: Self-pay | Admitting: *Deleted

## 2014-01-19 NOTE — Telephone Encounter (Signed)
Called the patient and advised that Dr Drue SecondSnider suggest he have the Flu and Pneumococcal vaccines. He advised he does not want them because he does not want to get sick. Advised him it is recommended and he should have them and he advised he will think about it and call us back if he changes his mind.

## 2014-01-19 NOTE — Telephone Encounter (Signed)
-----   Message from Judyann Munsonynthia Snider, MD sent at 01/18/2014  1:28 PM EST ----- Can we call him to get flu and pneumococcal vaccine

## 2014-01-23 ENCOUNTER — Ambulatory Visit (HOSPITAL_COMMUNITY): Payer: BC Managed Care – PPO

## 2014-01-24 ENCOUNTER — Ambulatory Visit: Payer: BC Managed Care – PPO | Admitting: Internal Medicine

## 2014-02-08 ENCOUNTER — Ambulatory Visit: Payer: BC Managed Care – PPO | Admitting: Cardiovascular Disease

## 2014-02-10 ENCOUNTER — Other Ambulatory Visit: Payer: BC Managed Care – PPO

## 2014-02-22 ENCOUNTER — Ambulatory Visit: Payer: BC Managed Care – PPO

## 2014-02-26 ENCOUNTER — Other Ambulatory Visit: Payer: Self-pay | Admitting: Internal Medicine

## 2014-02-27 ENCOUNTER — Other Ambulatory Visit: Payer: Self-pay | Admitting: *Deleted

## 2014-02-27 MED ORDER — TRAMADOL HCL 50 MG PO TABS: 50.0000 mg | ORAL_TABLET | Freq: Four times a day (QID) | ORAL | Status: DC | PRN

## 2014-03-06 ENCOUNTER — Ambulatory Visit: Payer: BC Managed Care – PPO | Admitting: Cardiovascular Disease

## 2014-03-09 ENCOUNTER — Ambulatory Visit: Payer: BC Managed Care – PPO

## 2014-03-28 ENCOUNTER — Ambulatory Visit: Payer: Self-pay

## 2014-04-03 ENCOUNTER — Ambulatory Visit: Payer: Self-pay | Admitting: Cardiovascular Disease

## 2014-04-06 ENCOUNTER — Ambulatory Visit: Payer: Self-pay

## 2014-04-06 ENCOUNTER — Encounter: Payer: Self-pay | Admitting: Internal Medicine

## 2014-04-06 ENCOUNTER — Other Ambulatory Visit: Payer: BC Managed Care – PPO

## 2014-04-06 ENCOUNTER — Ambulatory Visit (INDEPENDENT_AMBULATORY_CARE_PROVIDER_SITE_OTHER): Payer: BLUE CROSS/BLUE SHIELD | Admitting: Internal Medicine

## 2014-04-06 VITALS — BP 162/75 | HR 91 | Temp 98.4°F | Ht 68.0 in | Wt 164.0 lb

## 2014-04-06 DIAGNOSIS — J441 Chronic obstructive pulmonary disease with (acute) exacerbation: Secondary | ICD-10-CM | POA: Diagnosis not present

## 2014-04-06 DIAGNOSIS — J209 Acute bronchitis, unspecified: Secondary | ICD-10-CM

## 2014-04-06 MED ORDER — LEVOFLOXACIN 500 MG PO TABS: 500.0000 mg | ORAL_TABLET | Freq: Every day | ORAL | Status: DC

## 2014-04-06 NOTE — Progress Notes (Signed)
Patient ID: [name] Smith, male   DOB: 03-29-1960, 54 y.o.   MRN: [id number]       Patient ID: [name] Smith, male   DOB: 21-Apr-1960, 54 y.o.   MRN: [id number]  HPI [name] Smith is a 53yo M with HIV disease, CD 4 count of 880/VL<20, on stribild. Also has hx of CAD s/p CABG, HLD, HTN, presents with nasal congestion, productive cough, sinusitis, poor sleep. Has been trying otc without relief. No sick contacts  Outpatient Encounter Prescriptions as of 04/06/2014  Medication Sig  . albuterol (PROVENTIL HFA;VENTOLIN HFA) 108 (90 BASE) MCG/ACT inhaler Inhale 2 puffs into the lungs every 6 (six) hours as needed for wheezing or shortness of breath.  . ARIPiprazole (ABILIFY) 15 MG tablet Take 1 tablet (15 mg total) by mouth daily.  Marland Kitchen aspirin 325 MG EC tablet Take 325 mg by mouth daily.    Marland Kitchen buPROPion (WELLBUTRIN) 100 MG tablet Take 1 tablet (100 mg total) by mouth daily.  . clopidogrel (PLAVIX) 75 MG tablet Take 1 tablet (75 mg total) by mouth daily.  Marland Kitchen elvitegravir-cobicistat-emtricitabine-tenofovir (STRIBILD) 150-150-200-300 MG TABS tablet Take 1 tablet by mouth daily with breakfast.  . ibuprofen (ADVIL,MOTRIN) 600 MG tablet Take 1 tablet (600 mg total) by mouth every 6 (six) hours as needed for pain.  . meloxicam (MOBIC) 7.5 MG tablet Take 1 tablet (7.5 mg total) by mouth daily. Prn pain  . methocarbamol (ROBAXIN) 500 MG tablet Take one in the morning, one in the afternoon, and 2 at bedtime for muscle relaxant  . metoprolol succinate (TOPROL-XL) 50 MG 24 hr tablet TAKE 1 TABLET BY MOUTH EVERY DAY  . nitroGLYCERIN (NITROSTAT) 0.4 MG SL tablet Place 1 tablet (0.4 mg total) under the tongue every 5 (five) minutes as needed. For chest pain -- may repeat times three  . rosuvastatin (CRESTOR) 20 MG tablet Take 1 tablet (20 mg total) by mouth daily.  . sildenafil (VIAGRA) 50 MG tablet Take 1 tablet (50 mg total) by mouth daily as needed.  . tiotropium (SPIRIVA HANDIHALER) 18 MCG inhalation capsule Place  1 capsule (18 mcg total) into inhaler and inhale daily.  . traMADol (ULTRAM) 50 MG tablet Take 1 tablet (50 mg total) by mouth every 6 (six) hours as needed.  . triamcinolone cream (KENALOG) 0.1 % Apply topically 2 (two) times daily.  . valACYclovir (VALTREX) 1000 MG tablet Take 1 tablet (1,000 mg total) by mouth 3 (three) times daily.  . [DISCONTINUED] HYDROcodone-acetaminophen (NORCO/VICODIN) 5-325 MG per tablet Take 1 tablet by mouth daily as needed for moderate pain. (Patient not taking: Reported on 04/06/2014)  . [DISCONTINUED] nicotine (NICOTROL) 10 MG inhaler Inhale 1 puff into the lungs as needed for smoking cessation. (Patient not taking: Reported on 04/06/2014)     Patient Active Problem List   Diagnosis Date Noted  . Asthmatic bronchitis with acute exacerbation 01/03/2014  . Encounter for disability assessment 06/08/2013  . Murmur 10/14/2011  . URI (upper respiratory infection) 04/29/2011  . Bruit 10/28/2010  . SMOKER 11/06/2008  . COUGH 11/06/2008  . HYPERCHOLESTEROLEMIA 11/03/2008  . ANXIETY 11/03/2008  . HYPERTENSION 11/03/2008  . GERD 11/03/2008  . NEPHROLITHIASIS 11/03/2008  . HIV INFECTION 03/20/2006  . CORONARY ARTERY DISEASE 03/20/2006  . CORONARY ARTERY BYPASS GRAFT, THREE VESSEL, HX OF 03/20/2006     Health Maintenance Due  Topic Date Due  . TETANUS/TDAP  03/04/1980  . COLONOSCOPY  03/05/2011  . INFLUENZA VACCINE  10/08/2013     Review of Systems  Physical Exam   BP 150/83 mmHg  Pulse 91  Temp(Src) 98.4 F (36.9 C) (Oral)  Ht 5\' 8"  (1.727 m)  Wt 164 lb (74.39 kg)  BMI 24.94 kg/m2  Lab Results  Component Value Date   CD4TCELL 44 01/10/2014   Lab Results  Component Value Date   CD4TABS 880 01/10/2014   CD4TABS 860 10/03/2013   CD4TABS 1080 04/25/2013   Lab Results  Component Value Date   HIV1RNAQUANT <20 01/10/2014   Lab Results  Component Value Date   HEPBSAB Yes 05/04/2006   No results found for: RPR  CBC Lab Results  Component  Value Date   WBC 7.8 01/10/2014   RBC 4.23 01/10/2014   HGB 13.9 01/10/2014   HCT 40.6 01/10/2014   PLT 467* 01/10/2014   MCV 96.0 01/10/2014   MCH 32.9 01/10/2014   MCHC 34.2 01/10/2014   RDW 12.8 01/10/2014   LYMPHSABS 2.1 01/10/2014   MONOABS 0.5 01/10/2014   EOSABS 0.1 01/10/2014   BASOSABS 0.0 01/10/2014   BMET Lab Results  Component Value Date   NA 137 01/10/2014   K 4.9 01/10/2014   CL 102 01/10/2014   CO2 28 01/10/2014   GLUCOSE 140* 01/10/2014   BUN 13 01/10/2014   CREATININE 0.90 01/10/2014   CALCIUM 9.0 01/10/2014   GFRNONAA >89 01/10/2014   GFRAA >89 01/10/2014     Assessment and Plan  Bacterial sinusitis/bronchitis = will do a course of levofloxacin  Hypertension = recheck sbp but still elevated. Not previously elevated in the past records. He is taking sudafed and caffeine that could be contributing to isolated elevation. Will check at next visit. Also seeing cardiology next month for evaluation and follow up on CAD  hiv disease = continue on current regimen

## 2014-04-07 ENCOUNTER — Ambulatory Visit: Payer: Self-pay | Admitting: Cardiovascular Disease

## 2014-04-11 ENCOUNTER — Other Ambulatory Visit: Payer: Self-pay | Admitting: *Deleted

## 2014-04-11 DIAGNOSIS — B20 Human immunodeficiency virus [HIV] disease: Secondary | ICD-10-CM

## 2014-04-11 MED ORDER — ELVITEG-COBIC-EMTRICIT-TENOFDF 150-150-200-300 MG PO TABS: 1.0000 | ORAL_TABLET | Freq: Every day | ORAL | Status: DC

## 2014-04-11 NOTE — Telephone Encounter (Signed)
ADAP Application 

## 2014-04-13 ENCOUNTER — Telehealth: Payer: Self-pay | Admitting: *Deleted

## 2014-04-13 NOTE — Telephone Encounter (Signed)
Advised pt to try to obtain f/u visit with his PCP's office, Dr. Lupita RaiderKimberlee [name].  No appt available at [facility] until Monday, Feb. 8.  Pt stated that he continues to take Mucinex.  Nasal drainage is greenish in color.  No Fever.  Pt stated he does not feel that there is anything in his chest at this time.

## 2014-04-20 ENCOUNTER — Ambulatory Visit: Payer: BC Managed Care – PPO | Admitting: Internal Medicine

## 2014-04-24 ENCOUNTER — Ambulatory Visit (HOSPITAL_COMMUNITY)
Admission: RE | Admit: 2014-04-24 | Discharge: 2014-04-24 | Disposition: A | Discharge status: Home / Self Care | Payer: BLUE CROSS/BLUE SHIELD | Source: Ambulatory Visit | Attending: Internal Medicine | Admitting: Internal Medicine

## 2014-04-24 DIAGNOSIS — J441 Chronic obstructive pulmonary disease with (acute) exacerbation: Secondary | ICD-10-CM | POA: Diagnosis not present

## 2014-04-24 LAB — PULMONARY FUNCTION TEST
DL/VA % pred: 126 %
DL/VA: 5.72 ml/min/mmHg/L
DLCO unc % pred: 89 %
DLCO unc: 26.52 ml/min/mmHg
FEF 25-75 Pre: 2.56 L/sec
FEF2575-%Pred-Pre: 81 %
FEV1-%Pred-Pre: 74 %
FEV1-Pre: 2.66 L
FEV1FVC-%Pred-Pre: 102 %
FEV6-%Pred-Pre: 75 %
FEV6-Pre: 3.36 L
FEV6FVC-%Pred-Pre: 104 %
FVC-%Pred-Pre: 72 %
FVC-Pre: 3.36 L
Pre FEV1/FVC ratio: 79 %
Pre FEV6/FVC Ratio: 100 %
RV % pred: 96 %
RV: 1.92 L
TLC % pred: 81 %
TLC: 5.37 L

## 2014-04-25 ENCOUNTER — Ambulatory Visit: Payer: BLUE CROSS/BLUE SHIELD

## 2014-04-25 DIAGNOSIS — F3160 Bipolar disorder, current episode mixed, unspecified: Secondary | ICD-10-CM

## 2014-04-25 NOTE — BH Specialist Note (Signed)
[name] Smith was in good spirits today.  He reports he recently hCasimiro Needlead flu-like symptoms, but is better now.  He talked about his attention problems again today, so I downloaded an ADHD questionnaire and he filled it out.  He scored very high on the checklist for ADHD and I agreed to talk to his doctor about this.  He states that he hasn't been [facility] since he first went there, since his doctor here prescribes Abilify for him.  He said he didn't like going to [facility] and would prefer not to return.  He appears to be fairly stable at this time with no major complaints.  Plan to meet again in 4 weeks. Franne FortsKenny Clarnce Homan, LCSW  Whodas: 21

## 2014-05-01 NOTE — Progress Notes (Signed)
Patient ID: [name] DiamondMichael K Smith, male   DOB: [birth year], 54 y.o.   MRN: [id number] [name] DanceKeith is seen today for F/U of dyspnea, CAD S/P CABG x2 With redo 10/11. EF has been mildly reduced in the 45% range. No clinical history of CHF. No arryhthmia and no indication for AICD. Still smoking. Clinical COPD. Recent sinus infection with course of cipro.  Marland Kitchen. No SSCP, dyspnea from smoking. NO palpitatons or edema. Lots of stress regarding work and possible disability application. HIV stable and F/U with Dr Maurice MarchLane.  11/15  LDL still 88 on high dose zocor which interacts with atripla and his antiretroviral Rx the least Zocor increased by Dr Maurice MarchLane 4/13 to 80 mg daily.  LFTls Normal 2015  Having issues with depression ? Bipolar Discussed the fact that he should not take viagra or chantix Counseled at length about smoking cessation   Recent echo 3.15  with mild AS mean gradient 19 mmHg and peak 32 mmHg Carotids with 1-39% bilateral disease  2/15 needs f/u   [name] Smith has been doing poorly lately  The consellation of his HIV, COPD, CAD with two previous bypass surgeries and bipolar disease has disabled him   ROS: Denies fever, malais, weight loss, blurry vision, decreased visual acuity, cough, sputum, SOB, hemoptysis, pleuritic pain, palpitaitons, heartburn, abdominal pain, melena, lower extremity edema, claudication, or rash.  All other systems reviewed and negative  General: Affect agitated depression  Chronically ill  HEENT: normal Neck supple with no adenopathy JVP normal bilateral  bruits no thyromegaly Lungs clear with no wheezing and good diaphragmatic motion Heart:  S1/S2 AS  murmur, no rub, gallop or click PMI normal Abdomen: benighn, BS positve, no tenderness, no AAA no bruit.  No HSM or HJR Distal pulses intact with no bruits No edema Neuro non-focal Skin warm and dry No muscular weakness   Current Outpatient Prescriptions  Medication Sig Dispense Refill  . albuterol (PROVENTIL HFA;VENTOLIN HFA)  108 (90 BASE) MCG/ACT inhaler Inhale 2 puffs into the lungs every 6 (six) hours as needed for wheezing or shortness of breath. 1 Inhaler 2  . ARIPiprazole (ABILIFY) 15 MG tablet Take 1 tablet (15 mg total) by mouth daily. 30 tablet 11  . aspirin 325 MG EC tablet Take 325 mg by mouth daily.      Marland Kitchen. buPROPion (WELLBUTRIN) 100 MG tablet Take 1 tablet (100 mg total) by mouth daily. 30 tablet 3  . clopidogrel (PLAVIX) 75 MG tablet Take 1 tablet (75 mg total) by mouth daily. 90 tablet 3  . elvitegravir-cobicistat-emtricitabine-tenofovir (STRIBILD) 150-150-200-300 MG TABS tablet Take 1 tablet by mouth daily with breakfast. 30 tablet 5  . ibuprofen (ADVIL,MOTRIN) 600 MG tablet Take 1 tablet (600 mg total) by mouth every 6 (six) hours as needed for pain. 30 tablet 0  . levofloxacin (LEVAQUIN) 500 MG tablet Take 1 tablet (500 mg total) by mouth daily. 5 tablet 0  . meloxicam (MOBIC) 7.5 MG tablet Take 1 tablet (7.5 mg total) by mouth daily. Prn pain 30 tablet 0  . methocarbamol (ROBAXIN) 500 MG tablet Take one in the morning, one in the afternoon, and 2 at bedtime for muscle relaxant 40 tablet 0  . metoprolol succinate (TOPROL-XL) 50 MG 24 hr tablet TAKE 1 TABLET BY MOUTH EVERY DAY 90 tablet 0  . nitroGLYCERIN (NITROSTAT) 0.4 MG SL tablet Place 1 tablet (0.4 mg total) under the tongue every 5 (five) minutes as needed. For chest pain -- may repeat times three 25 tablet 11  . rosuvastatin (  CRESTOR) 20 MG tablet Take 1 tablet (20 mg total) by mouth daily. 30 tablet 11  . sildenafil (VIAGRA) 50 MG tablet Take 1 tablet (50 mg total) by mouth daily as needed. 30 tablet 0  . tiotropium (SPIRIVA HANDIHALER) 18 MCG inhalation capsule Place 1 capsule (18 mcg total) into inhaler and inhale daily. 30 capsule 12  . traMADol (ULTRAM) 50 MG tablet Take 1 tablet (50 mg total) by mouth every 6 (six) hours as needed. 45 tablet 2  . triamcinolone cream (KENALOG) 0.1 % Apply topically 2 (two) times daily. 30 g 1  . valACYclovir  (VALTREX) 1000 MG tablet Take 1 tablet (1,000 mg total) by mouth 3 (three) times daily. 21 tablet 1   No current facility-administered medications for this visit.    Allergies  Azithromycin  Electrocardiogram:   SR rate 102 LVH  03/25/13  05/02/14  SR rate 84 LVH no sig change   Assessment and Plan

## 2014-05-02 ENCOUNTER — Ambulatory Visit (INDEPENDENT_AMBULATORY_CARE_PROVIDER_SITE_OTHER): Payer: BLUE CROSS/BLUE SHIELD | Admitting: Cardiovascular Disease

## 2014-05-02 ENCOUNTER — Encounter: Payer: Self-pay | Admitting: Cardiovascular Disease

## 2014-05-02 VITALS — BP 148/76 | HR 84 | Ht 68.0 in | Wt 162.1 lb

## 2014-05-02 DIAGNOSIS — J069 Acute upper respiratory infection, unspecified: Secondary | ICD-10-CM

## 2014-05-02 DIAGNOSIS — J329 Chronic sinusitis, unspecified: Secondary | ICD-10-CM

## 2014-05-02 DIAGNOSIS — I159 Secondary hypertension, unspecified: Secondary | ICD-10-CM

## 2014-05-02 DIAGNOSIS — R011 Cardiac murmur, unspecified: Secondary | ICD-10-CM

## 2014-05-02 DIAGNOSIS — R0989 Other specified symptoms and signs involving the circulatory and respiratory systems: Secondary | ICD-10-CM

## 2014-05-02 NOTE — Assessment & Plan Note (Signed)
F/U carotid duplex  1-39% bilateral disease 2/15  On ASA and Plavix

## 2014-05-02 NOTE — Patient Instructions (Signed)
Your physician wants you to follow-up in:   6 MONTHS  WITH  DR Haywood FillerNISHAN  You will receive a reminder letter in the mail two months in advance. If you don't receive a letter, please call our office to schedule the follow-up appointment. Your physician recommends that you continue on your current medications as directed. Please refer to the Current Medication list given to you today. Your physician has requested that you have a carotid duplex. This test is an ultrasound of the carotid arteries in your neck. It looks at blood flow through these arteries that supply the brain with blood. Allow one hour for this exam. There are no restrictions or special instructions. You have been referred to ENT    DR   BYERS   OR  DR  Annalee GentaSHOEMAKER

## 2014-05-02 NOTE — Assessment & Plan Note (Signed)
Stable with no angina and good activity level.  Continue medical Rx  

## 2014-05-02 NOTE — Assessment & Plan Note (Signed)
Mild AS  Consider f/u echo in a year

## 2014-05-02 NOTE — Assessment & Plan Note (Signed)
Well controlled.  Continue current medications and low sodium Dash type diet.    

## 2014-05-02 NOTE — Assessment & Plan Note (Signed)
Dr Ilsa IhaSnyder ordered PFT's  Alternating between E cig and smoking  Wheezing on baseline exam worse and lots of sinus issues.  Inhalers Refer to ENT Given results of PFTls would also refer to Pulmonary

## 2014-05-15 ENCOUNTER — Encounter (HOSPITAL_COMMUNITY): Payer: BLUE CROSS/BLUE SHIELD

## 2014-05-23 ENCOUNTER — Ambulatory Visit: Payer: BLUE CROSS/BLUE SHIELD

## 2014-05-23 ENCOUNTER — Ambulatory Visit (HOSPITAL_COMMUNITY): Payer: BLUE CROSS/BLUE SHIELD | Attending: Cardiovascular Disease | Admitting: Cardiology

## 2014-05-23 DIAGNOSIS — I6523 Occlusion and stenosis of bilateral carotid arteries: Secondary | ICD-10-CM

## 2014-05-23 DIAGNOSIS — R0989 Other specified symptoms and signs involving the circulatory and respiratory systems: Secondary | ICD-10-CM | POA: Diagnosis not present

## 2014-05-23 NOTE — Progress Notes (Signed)
Carotid duplex performed 

## 2014-05-29 ENCOUNTER — Telehealth: Payer: Self-pay | Admitting: Cardiovascular Disease

## 2014-05-29 NOTE — Telephone Encounter (Signed)
PT AWARE OF CAROTID RESULTS ./CY 

## 2014-05-29 NOTE — Telephone Encounter (Signed)
New problem   Pt is returning your call from last week and stated if he don't answer please leave him a message.

## 2014-05-30 ENCOUNTER — Ambulatory Visit: Payer: BLUE CROSS/BLUE SHIELD

## 2014-05-30 DIAGNOSIS — F3160 Bipolar disorder, current episode mixed, unspecified: Secondary | ICD-10-CM

## 2014-05-30 NOTE — BH Specialist Note (Signed)
[name] Smith was in a pleasant mood today.  He reports that he has recently been to the ENT due to multiple sinus infections and that he found out he has a deviated septum and may need surgery for this.  He also talked about his ongoing appeal for Social Security Disability and is frustrated that it has dragged on for several years.  Overall, he seems to be handling things well at this time and says he is taking his medication consistently.  Plan to meet next month. [name] FortsKenny Ayriana Wix, LCSW  Whodas: 30

## 2014-06-23 ENCOUNTER — Other Ambulatory Visit: Payer: Self-pay | Admitting: *Deleted

## 2014-06-23 DIAGNOSIS — Z113 Encounter for screening for infections with a predominantly sexual mode of transmission: Secondary | ICD-10-CM

## 2014-06-23 DIAGNOSIS — Z79899 Other long term (current) drug therapy: Secondary | ICD-10-CM

## 2014-06-23 DIAGNOSIS — B20 Human immunodeficiency virus [HIV] disease: Secondary | ICD-10-CM

## 2014-06-26 ENCOUNTER — Other Ambulatory Visit: Payer: Self-pay | Admitting: Cardiovascular Disease

## 2014-06-27 ENCOUNTER — Ambulatory Visit: Payer: BLUE CROSS/BLUE SHIELD

## 2014-06-27 ENCOUNTER — Other Ambulatory Visit: Payer: BLUE CROSS/BLUE SHIELD

## 2014-06-27 DIAGNOSIS — F319 Bipolar disorder, unspecified: Secondary | ICD-10-CM

## 2014-06-27 DIAGNOSIS — Z113 Encounter for screening for infections with a predominantly sexual mode of transmission: Secondary | ICD-10-CM

## 2014-06-27 DIAGNOSIS — B20 Human immunodeficiency virus [HIV] disease: Secondary | ICD-10-CM

## 2014-06-27 LAB — CBC WITH DIFFERENTIAL/PLATELET
Basophils Absolute: 0 10*3/uL (ref 0.0–0.1)
Basophils Relative: 0 % (ref 0–1)
Eosinophils Absolute: 0.1 10*3/uL (ref 0.0–0.7)
Eosinophils Relative: 2 % (ref 0–5)
HCT: 50.7 % (ref 39.0–52.0)
Hemoglobin: 17.6 g/dL — ABNORMAL HIGH (ref 13.0–17.0)
Lymphocytes Relative: 28 % (ref 12–46)
Lymphs Abs: 1.9 10*3/uL (ref 0.7–4.0)
MCH: 34.8 pg — ABNORMAL HIGH (ref 26.0–34.0)
MCHC: 34.7 g/dL (ref 30.0–36.0)
MCV: 100.2 fL — ABNORMAL HIGH (ref 78.0–100.0)
MPV: 10.4 fL (ref 8.6–12.4)
Monocytes Absolute: 0.4 10*3/uL (ref 0.1–1.0)
Monocytes Relative: 6 % (ref 3–12)
Neutro Abs: 4.3 10*3/uL (ref 1.7–7.7)
Neutrophils Relative %: 64 % (ref 43–77)
Platelets: 224 10*3/uL (ref 150–400)
RBC: 5.06 MIL/uL (ref 4.22–5.81)
RDW: 13.3 % (ref 11.5–15.5)
WBC: 6.7 10*3/uL (ref 4.0–10.5)

## 2014-06-27 LAB — COMPLETE METABOLIC PANEL WITH GFR
ALT: 28 U/L (ref 0–53)
AST: 24 U/L (ref 0–37)
Albumin: 4.3 g/dL (ref 3.5–5.2)
Alkaline Phosphatase: 65 U/L (ref 39–117)
BUN: 15 mg/dL (ref 6–23)
CO2: 28 mEq/L (ref 19–32)
Calcium: 9.6 mg/dL (ref 8.4–10.5)
Chloride: 103 mEq/L (ref 96–112)
Creat: 1.14 mg/dL (ref 0.50–1.35)
GFR, Est African American: 84 mL/min
GFR, Est Non African American: 73 mL/min
Glucose, Bld: 106 mg/dL — ABNORMAL HIGH (ref 70–99)
Potassium: 4.1 mEq/L (ref 3.5–5.3)
Sodium: 139 mEq/L (ref 135–145)
Total Bilirubin: 0.7 mg/dL (ref 0.2–1.2)
Total Protein: 7.3 g/dL (ref 6.0–8.3)

## 2014-06-27 NOTE — BH Specialist Note (Signed)
[name] Smith came in today somewhat agitated, expressing frustration over his ongoing attempt to get Social Security Disability.  He said he has used up most of his 401K and has to rely on his parents to pay his bills so that he can get by.  He lives by himself and still tends to isolate himself most of the time.  I wrote a letter to Disability for him.  Plan to meet in 3 weeks in conjunction with his doctor's appointment. [name] FortsKenny Nabilah Davoli, LCSW  Whodas: [phone]

## 2014-06-28 LAB — RPR TITER: RPR Titer: 1:8 {titer}

## 2014-06-28 LAB — RPR: RPR Ser Ql: REACTIVE — AB

## 2014-06-28 LAB — URINE CYTOLOGY ANCILLARY ONLY
Chlamydia: NEGATIVE
Neisseria Gonorrhea: NEGATIVE

## 2014-06-28 LAB — T-HELPER CELL (CD4) - (RCID CLINIC ONLY)
CD4 % Helper T Cell: 41 % (ref 33–55)
CD4 T Cell Abs: 740 /uL (ref 400–2700)

## 2014-06-28 LAB — FLUORESCENT TREPONEMAL AB(FTA)-IGG-BLD: Fluorescent Treponemal ABS: REACTIVE — AB

## 2014-06-29 LAB — HIV-1 RNA QUANT-NO REFLEX-BLD
HIV 1 RNA Quant: <20 copies/mL (ref <20)
HIV-1 RNA Quant, Log: <1.3 {Log} (ref <1.30)

## 2014-07-05 ENCOUNTER — Telehealth: Payer: Self-pay | Admitting: *Deleted

## 2014-07-05 NOTE — Telephone Encounter (Signed)
Pt being [facility].  Pt elected to go there for treatment per Ms Darel HongSongster from the [employer].

## 2014-07-05 NOTE — Telephone Encounter (Signed)
Pt has + RPR, 1:8 titre.  Pt has been contacted to [facility] to the [facility] for treatment today.

## 2014-07-05 NOTE — Telephone Encounter (Signed)
Can you have him come in for 3 weekly PCN IM shots since it has been > 1 years since last tested. We don't know if this is a recent exposure or late as of early 2015

## 2014-07-18 ENCOUNTER — Ambulatory Visit (INDEPENDENT_AMBULATORY_CARE_PROVIDER_SITE_OTHER): Payer: BLUE CROSS/BLUE SHIELD | Admitting: Internal Medicine

## 2014-07-18 ENCOUNTER — Ambulatory Visit: Payer: BLUE CROSS/BLUE SHIELD

## 2014-07-18 ENCOUNTER — Encounter: Payer: Self-pay | Admitting: Internal Medicine

## 2014-07-18 VITALS — BP 150/90 | HR 89 | Temp 98.2°F

## 2014-07-18 DIAGNOSIS — N179 Acute kidney failure, unspecified: Secondary | ICD-10-CM | POA: Diagnosis not present

## 2014-07-18 DIAGNOSIS — B20 Human immunodeficiency virus [HIV] disease: Secondary | ICD-10-CM | POA: Diagnosis not present

## 2014-07-18 DIAGNOSIS — F331 Major depressive disorder, recurrent, moderate: Secondary | ICD-10-CM

## 2014-07-18 DIAGNOSIS — F319 Bipolar disorder, unspecified: Secondary | ICD-10-CM

## 2014-07-18 NOTE — Progress Notes (Signed)
Patient ID: [name] Smith, male   DOB: 04/20/1960, 54 y.o.   MRN: [id number]       Patient ID: [name] Smith, male   DOB: 04/20/1960, 54 y.o.   MRN: [id number]  HPI  CD 4 count 740/VL<20 on stribild. He feels like he is having still bruising from pcn injection. Slow to resolve. Mood is down due to prolonged process in applying for disability. He feels more fatigue but attributes in part to mood/depression. He continues to meet with counselor on monthly basis for his mental health. Appears to be helpful.  Outpatient Encounter Prescriptions as of 07/18/2014  Medication Sig  . albuterol (PROVENTIL HFA;VENTOLIN HFA) 108 (90 BASE) MCG/ACT inhaler Inhale 2 puffs into the lungs every 6 (six) hours as needed for wheezing or shortness of breath.  . ARIPiprazole (ABILIFY) 15 MG tablet Take 1 tablet (15 mg total) by mouth daily.  Marland Kitchen. aspirin 325 MG EC tablet Take 325 mg by mouth daily.    Marland Kitchen. buPROPion (WELLBUTRIN) 100 MG tablet Take 1 tablet (100 mg total) by mouth daily.  . clopidogrel (PLAVIX) 75 MG tablet TAKE 1 TABLET (75 MG TOTAL) BY MOUTH DAILY.  Marland Kitchen. elvitegravir-cobicistat-emtricitabine-tenofovir (STRIBILD) 150-150-200-300 MG TABS tablet Take 1 tablet by mouth daily with breakfast.  . ibuprofen (ADVIL,MOTRIN) 600 MG tablet Take 1 tablet (600 mg total) by mouth every 6 (six) hours as needed for pain.  . metoprolol succinate (TOPROL-XL) 50 MG 24 hr tablet TAKE 1 TABLET BY MOUTH EVERY DAY  . nitroGLYCERIN (NITROSTAT) 0.4 MG SL tablet Place 1 tablet (0.4 mg total) under the tongue every 5 (five) minutes as needed. For chest pain -- may repeat times three  . rosuvastatin (CRESTOR) 20 MG tablet Take 1 tablet (20 mg total) by mouth daily.  . sildenafil (VIAGRA) 50 MG tablet Take 1 tablet (50 mg total) by mouth daily as needed.  . tiotropium (SPIRIVA HANDIHALER) 18 MCG inhalation capsule Place 1 capsule (18 mcg total) into inhaler and inhale daily.  . traMADol (ULTRAM) 50 MG tablet Take 1 tablet (50 mg  total) by mouth every 6 (six) hours as needed.  . triamcinolone cream (KENALOG) 0.1 % Apply topically 2 (two) times daily.  . valACYclovir (VALTREX) 1000 MG tablet Take 1 tablet (1,000 mg total) by mouth 3 (three) times daily.   No facility-administered encounter medications on file as of 07/18/2014.     Patient Active Problem List   Diagnosis Date Noted  . Asthmatic bronchitis with acute exacerbation 01/03/2014  . Encounter for disability assessment 06/08/2013  . Murmur 10/14/2011  . URI (upper respiratory infection) 04/29/2011  . Bruit 10/28/2010  . SMOKER 11/06/2008  . COUGH 11/06/2008  . HYPERCHOLESTEROLEMIA 11/03/2008  . ANXIETY 11/03/2008  . Essential hypertension 11/03/2008  . GERD 11/03/2008  . NEPHROLITHIASIS 11/03/2008  . HIV INFECTION 03/20/2006  . CORONARY ARTERY DISEASE 03/20/2006  . CORONARY ARTERY BYPASS GRAFT, THREE VESSEL, HX OF 03/20/2006     Health Maintenance Due  Topic Date Due  . TETANUS/TDAP  03/04/1980  . COLONOSCOPY  03/05/2011     Review of Systems 10 point ros is negative except for decrease mood, fatigue, + frustartion Physical Exam   BP 150/90 mmHg  Pulse 89  Temp(Src) 98.2 F (36.8 C) (Oral)  SpO2 97% Physical Exam  Constitutional: He is oriented to person, place, and time. He appears well-developed and well-nourished. No distress.  HENT:  Mouth/Throat: Oropharynx is clear and moist. No oropharyngeal exudate.  Cardiovascular: Normal rate, regular rhythm and normal  heart sounds. Exam reveals no gallop and no friction rub.  No murmur heard.  Pulmonary/Chest: Effort normal and breath sounds normal. No respiratory distress. He has no wheezes.  Abdominal: Soft. Bowel sounds are normal. He exhibits no distension. There is no tenderness.  Lymphadenopathy:  He has no cervical adenopathy.  Neurological: He is alert and oriented to person, place, and time.  Skin: Skin is warm and dry. No rash noted. No erythema.  Psychiatric: He has a normal  mood and affect. His behavior is normal.    Lab Results  Component Value Date   CD4TCELL 41 06/27/2014   Lab Results  Component Value Date   CD4TABS 740 06/27/2014   CD4TABS 880 01/10/2014   CD4TABS 860 10/03/2013   Lab Results  Component Value Date   HIV1RNAQUANT <20 06/27/2014   Lab Results  Component Value Date   HEPBSAB Yes 05/04/2006   No results found for: RPR  CBC Lab Results  Component Value Date   WBC 6.7 06/27/2014   RBC 5.06 06/27/2014   HGB 17.6* 06/27/2014   HCT 50.7 06/27/2014   PLT 224 06/27/2014   MCV 100.2* 06/27/2014   MCH 34.8* 06/27/2014   MCHC 34.7 06/27/2014   RDW 13.3 06/27/2014   LYMPHSABS 1.9 06/27/2014   MONOABS 0.4 06/27/2014   EOSABS 0.1 06/27/2014   BASOSABS 0.0 06/27/2014   BMET Lab Results  Component Value Date   NA 139 06/27/2014   K 4.1 06/27/2014   CL 103 06/27/2014   CO2 28 06/27/2014   GLUCOSE 106* 06/27/2014   BUN 15 06/27/2014   CREATININE 1.14 06/27/2014   CALCIUM 9.6 06/27/2014   GFRNONAA 73 06/27/2014   GFRAA 84 06/27/2014     Assessment and Plan  Depression, dysthemia = seeing kenny. More withdrawn.  hiv disease = will write letter for change in health. Lower CD 4 count this year than last, but still remains undetectable due to good viral suppression on stribild  aki = will need to monitor, consider to change to TAF based regimen if still elevated

## 2014-07-18 NOTE — BH Specialist Note (Signed)
[name] Smith was in a fairly good mood today, but continues to express frustration that he has to wait so long to get a hearing with Disability.  He says he has gone through all of his 401K money just paying his bills.  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  He said he gets angry sometimes thinking about it.  I gave him a handout on "things outside my control" vs "things I can control" and encouraged him to let go of worrying about when they will take his case.  Plan to meet in one month. [name] FortsKenny Jaeleigh Monaco, LCSW  Whodas: 28

## 2014-07-19 DIAGNOSIS — L989 Disorder of the skin and subcutaneous tissue, unspecified: Secondary | ICD-10-CM | POA: Insufficient documentation

## 2014-07-19 DIAGNOSIS — M545 Low back pain, unspecified: Secondary | ICD-10-CM | POA: Insufficient documentation

## 2014-07-19 DIAGNOSIS — G8929 Other chronic pain: Secondary | ICD-10-CM | POA: Insufficient documentation

## 2014-07-24 ENCOUNTER — Other Ambulatory Visit: Payer: Self-pay | Admitting: *Deleted

## 2014-07-24 ENCOUNTER — Telehealth: Payer: Self-pay | Admitting: *Deleted

## 2014-07-24 DIAGNOSIS — E785 Hyperlipidemia, unspecified: Secondary | ICD-10-CM

## 2014-07-24 DIAGNOSIS — B20 Human immunodeficiency virus [HIV] disease: Secondary | ICD-10-CM

## 2014-07-24 MED ORDER — ROSUVASTATIN CALCIUM 20 MG PO TABS: 20.0000 mg | ORAL_TABLET | Freq: Every day | ORAL | Status: DC

## 2014-07-24 MED ORDER — ELVITEG-COBIC-EMTRICIT-TENOFDF 150-150-200-300 MG PO TABS: 1.0000 | ORAL_TABLET | Freq: Every day | ORAL | Status: DC

## 2014-07-24 NOTE — Telephone Encounter (Signed)
PA for generic Crestor received.  Completed PA on CoverMyMeds website.  May take 48-72 hours for response.

## 2014-07-25 ENCOUNTER — Ambulatory Visit: Payer: BLUE CROSS/BLUE SHIELD

## 2014-07-25 NOTE — Telephone Encounter (Signed)
PA approved for generic Crestor, pharmacy notified.

## 2014-08-15 ENCOUNTER — Ambulatory Visit: Payer: BLUE CROSS/BLUE SHIELD

## 2014-08-21 ENCOUNTER — Ambulatory Visit: Payer: BLUE CROSS/BLUE SHIELD

## 2014-08-21 DIAGNOSIS — F319 Bipolar disorder, unspecified: Secondary | ICD-10-CM

## 2014-08-21 NOTE — BH Specialist Note (Signed)
I met with [name] Smith again today and he reports that he continues to struggle with depressed mood and worry about his situation.  He also reports some physical pain symptoms from his plantar fascitis and says he has shortness of breath due to his COPD.  He said he is using his inhaler more lately.  He continues to be unemployed and says that he is unable to work due to his health conditions as well as his Bipolar Disorder and I certainly agree that work would be problematic for him.  Plan to meet again in one month.  [name] Smith, [place] 08/21/2014

## 2014-08-24 ENCOUNTER — Ambulatory Visit: Payer: BLUE CROSS/BLUE SHIELD | Attending: Internal Medicine

## 2014-09-19 ENCOUNTER — Ambulatory Visit: Payer: BLUE CROSS/BLUE SHIELD | Admitting: Internal Medicine

## 2014-09-19 ENCOUNTER — Ambulatory Visit: Payer: BLUE CROSS/BLUE SHIELD

## 2014-10-03 ENCOUNTER — Ambulatory Visit (INDEPENDENT_AMBULATORY_CARE_PROVIDER_SITE_OTHER): Payer: BLUE CROSS/BLUE SHIELD | Admitting: Internal Medicine

## 2014-10-03 ENCOUNTER — Encounter: Payer: Self-pay | Admitting: Internal Medicine

## 2014-10-03 VITALS — BP 152/78 | HR 94 | Temp 97.7°F | Wt 155.0 lb

## 2014-10-03 DIAGNOSIS — I10 Essential (primary) hypertension: Secondary | ICD-10-CM

## 2014-10-03 DIAGNOSIS — E785 Hyperlipidemia, unspecified: Secondary | ICD-10-CM | POA: Diagnosis not present

## 2014-10-03 DIAGNOSIS — R5382 Chronic fatigue, unspecified: Secondary | ICD-10-CM

## 2014-10-03 DIAGNOSIS — B20 Human immunodeficiency virus [HIV] disease: Secondary | ICD-10-CM | POA: Diagnosis not present

## 2014-10-03 DIAGNOSIS — J441 Chronic obstructive pulmonary disease with (acute) exacerbation: Secondary | ICD-10-CM | POA: Diagnosis not present

## 2014-10-03 LAB — COMPLETE METABOLIC PANEL WITH GFR
ALT: 28 U/L (ref 9–46)
AST: 29 U/L (ref 10–35)
Albumin: 4.2 g/dL (ref 3.6–5.1)
Alkaline Phosphatase: 55 U/L (ref 40–115)
BUN: 16 mg/dL (ref 7–25)
CO2: 26 mEq/L (ref 20–31)
Calcium: 9.3 mg/dL (ref 8.6–10.3)
Chloride: 103 mEq/L (ref 98–110)
Creat: 1.05 mg/dL (ref 0.70–1.33)
GFR, Est African American: >89 mL/min (ref >=60)
GFR, Est Non African American: 81 mL/min (ref >=60)
Glucose, Bld: 111 mg/dL — ABNORMAL HIGH (ref 65–99)
Potassium: 4.4 mEq/L (ref 3.5–5.3)
Sodium: 138 mEq/L (ref 135–146)
Total Bilirubin: 0.7 mg/dL (ref 0.2–1.2)
Total Protein: 6.8 g/dL (ref 6.1–8.1)

## 2014-10-03 LAB — CBC WITH DIFFERENTIAL/PLATELET
Basophils Absolute: 0 10*3/uL (ref 0.0–0.1)
Basophils Relative: 0 % (ref 0–1)
Eosinophils Absolute: 0.1 10*3/uL (ref 0.0–0.7)
Eosinophils Relative: 1 % (ref 0–5)
HCT: 49.5 % (ref 39.0–52.0)
Hemoglobin: 17.2 g/dL — ABNORMAL HIGH (ref 13.0–17.0)
Lymphocytes Relative: 28 % (ref 12–46)
Lymphs Abs: 2.2 10*3/uL (ref 0.7–4.0)
MCH: 35 pg — ABNORMAL HIGH (ref 26.0–34.0)
MCHC: 34.7 g/dL (ref 30.0–36.0)
MCV: 100.6 fL — ABNORMAL HIGH (ref 78.0–100.0)
MPV: 10 fL (ref 8.6–12.4)
Monocytes Absolute: 0.4 10*3/uL (ref 0.1–1.0)
Monocytes Relative: 5 % (ref 3–12)
Neutro Abs: 5.1 10*3/uL (ref 1.7–7.7)
Neutrophils Relative %: 66 % (ref 43–77)
Platelets: 219 10*3/uL (ref 150–400)
RBC: 4.92 MIL/uL (ref 4.22–5.81)
RDW: 12.8 % (ref 11.5–15.5)
WBC: 7.7 10*3/uL (ref 4.0–10.5)

## 2014-10-03 LAB — T4, FREE: Free T4: 1 ng/dL (ref 0.80–1.80)

## 2014-10-03 LAB — TSH: TSH: 1.088 u[IU]/mL (ref 0.350–4.500)

## 2014-10-03 NOTE — Progress Notes (Signed)
Patient ID: [name] Smith, male   DOB: [birth year], 54 y.o.   MRN: [id number]       Patient ID: [name] Smith, male   DOB: 1960/05/17, 54 y.o.   MRN: [id number]  HPI [name] Smith is a 54yo M with HIV disease, CD 4 count of 740/VL<20 on stribild.he states that he is feeling like his multiple co-morbidities are worsening due to increasing fatigue. He has continued to follow up with his pcp, pulmonologist and cardiologist. He has been decreasing his smoking. Re applying for disability. He is improving for uri, from last visit.  Outpatient Encounter Prescriptions as of 10/03/2014  Medication Sig  . albuterol (PROVENTIL HFA;VENTOLIN HFA) 108 (90 BASE) MCG/ACT inhaler Inhale 2 puffs into the lungs every 6 (six) hours as needed for wheezing or shortness of breath.  . ARIPiprazole (ABILIFY) 15 MG tablet Take 1 tablet (15 mg total) by mouth daily.  Marland Kitchen aspirin 325 MG EC tablet Take 325 mg by mouth daily.    Marland Kitchen buPROPion (WELLBUTRIN) 100 MG tablet Take 1 tablet (100 mg total) by mouth daily.  . clopidogrel (PLAVIX) 75 MG tablet TAKE 1 TABLET (75 MG TOTAL) BY MOUTH DAILY.  Marland Kitchen elvitegravir-cobicistat-emtricitabine-tenofovir (STRIBILD) 150-150-200-300 MG TABS tablet Take 1 tablet by mouth daily with breakfast.  . ibuprofen (ADVIL,MOTRIN) 600 MG tablet Take 1 tablet (600 mg total) by mouth every 6 (six) hours as needed for pain.  . metoprolol succinate (TOPROL-XL) 50 MG 24 hr tablet TAKE 1 TABLET BY MOUTH EVERY DAY  . nitroGLYCERIN (NITROSTAT) 0.4 MG SL tablet Place 1 tablet (0.4 mg total) under the tongue every 5 (five) minutes as needed. For chest pain -- may repeat times three  . rosuvastatin (CRESTOR) 20 MG tablet Take 1 tablet (20 mg total) by mouth daily.  . sildenafil (VIAGRA) 50 MG tablet Take 1 tablet (50 mg total) by mouth daily as needed.  . tiotropium (SPIRIVA HANDIHALER) 18 MCG inhalation capsule Place 1 capsule (18 mcg total) into inhaler and inhale daily.  . traMADol (ULTRAM) 50 MG tablet Take 1  tablet (50 mg total) by mouth every 6 (six) hours as needed.  . triamcinolone cream (KENALOG) 0.1 % Apply topically 2 (two) times daily.  . valACYclovir (VALTREX) 1000 MG tablet Take 1 tablet (1,000 mg total) by mouth 3 (three) times daily.   No facility-administered encounter medications on file as of 10/03/2014.     Patient Active Problem List   Diagnosis Date Noted  . Asthmatic bronchitis with acute exacerbation 01/03/2014  . Encounter for disability assessment 06/08/2013  . Murmur 10/14/2011  . URI (upper respiratory infection) 04/29/2011  . Bruit 10/28/2010  . SMOKER 11/06/2008  . COUGH 11/06/2008  . HYPERCHOLESTEROLEMIA 11/03/2008  . ANXIETY 11/03/2008  . Essential hypertension 11/03/2008  . GERD 11/03/2008  . NEPHROLITHIASIS 11/03/2008  . HIV INFECTION 03/20/2006  . CORONARY ARTERY DISEASE 03/20/2006  . CORONARY ARTERY BYPASS GRAFT, THREE VESSEL, HX OF 03/20/2006     Health Maintenance Due  Topic Date Due  . TETANUS/TDAP  03/04/1980  . COLONOSCOPY  03/05/2011     Review of Systems + fatigue, no DOE worsening than baseline, no orthopnea, no chest pain. 10 point ros reviewed, are otherwise negative Physical Exam   BP 152/78 mmHg  Pulse 94  Temp(Src) 97.7 F (36.5 C) (Oral)  Wt 155 lb (70.308 kg) Physical Exam  Constitutional: He is oriented to person, place, and time. He appears well-developed and well-nourished. No distress.  HENT:  Mouth/Throat: Oropharynx is clear and  moist. No oropharyngeal exudate.  Cardiovascular: Normal rate, + 3/6 SEM BH LUSB No murmur heard.  Pulmonary/Chest: Effort normal and breath sounds normal. No respiratory distress. He has no wheezes.  Lymphadenopathy:  He has no cervical adenopathy.  Skin: Skin is warm and dry. No rash noted. No erythema.  Psychiatric: He has a normal mood and affect. His behavior is normal.    Lab Results  Component Value Date   CD4TCELL 41 06/27/2014   Lab Results  Component Value Date   CD4TABS 740  06/27/2014   CD4TABS 880 01/10/2014   CD4TABS 860 10/03/2013   Lab Results  Component Value Date   HIV1RNAQUANT <20 06/27/2014   Lab Results  Component Value Date   HEPBSAB Yes 05/04/2006   No results found for: RPR  CBC Lab Results  Component Value Date   WBC 6.7 06/27/2014   RBC 5.06 06/27/2014   HGB 17.6* 06/27/2014   HCT 50.7 06/27/2014   PLT 224 06/27/2014   MCV 100.2* 06/27/2014   MCH 34.8* 06/27/2014   MCHC 34.7 06/27/2014   RDW 13.3 06/27/2014   LYMPHSABS 1.9 06/27/2014   MONOABS 0.4 06/27/2014   EOSABS 0.1 06/27/2014   BASOSABS 0.0 06/27/2014   BMET Lab Results  Component Value Date   NA 139 06/27/2014   K 4.1 06/27/2014   CL 103 06/27/2014   CO2 28 06/27/2014   GLUCOSE 106* 06/27/2014   BUN 15 06/27/2014   CREATININE 1.14 06/27/2014   CALCIUM 9.6 06/27/2014   GFRNONAA 73 06/27/2014   GFRAA 84 06/27/2014     Assessment and Plan  hiv disesae =well controlled with treatment, will do lab work. Due to impact on kidneys, we will  Switch him to genvoya  Fatigue = will do lab work to include cbc looking for anemia as potential cause, check for thyroid function, and other vitamin deficiencies  Hyperlipidemia= continue on crestor  Hypertension = not well controlled at this visit, he states he had not taken meds yet. Will continue to follow at next visit, may need to titrate his meds  Smoking cessation = encouraged to decrease smoking  Chronic copd = he had uri ,but appears now back to baseline. Recommend using inhalers as prescribed

## 2014-10-04 ENCOUNTER — Ambulatory Visit: Payer: BLUE CROSS/BLUE SHIELD

## 2014-10-04 DIAGNOSIS — F319 Bipolar disorder, unspecified: Secondary | ICD-10-CM

## 2014-10-04 LAB — T-HELPER CELL (CD4) - (RCID CLINIC ONLY)
CD4 % Helper T Cell: 40 % (ref 33–55)
CD4 T Cell Abs: 860 /uL (ref 400–2700)

## 2014-10-04 LAB — RPR

## 2014-10-04 MED ORDER — ALBUTEROL SULFATE HFA 108 (90 BASE) MCG/ACT IN AERS: 2.0000 | INHALATION_SPRAY | Freq: Four times a day (QID) | RESPIRATORY_TRACT | Status: DC | PRN

## 2014-10-04 NOTE — BH Specialist Note (Signed)
[name] Smith was in a fairly pleasant mood today, reporting that he continues to wait for a hearing on his disability.  He talked about the current heat wave and how he has to be careful not to overdo it, mowing his yard, etc., given his history of heart problems.  He appears to be stable at this time. Franne Forts, LCSW

## 2014-10-05 LAB — HIV-1 RNA QUANT-NO REFLEX-BLD
HIV 1 RNA Quant: <20 copies/mL (ref <20)
HIV-1 RNA Quant, Log: <1.3 {Log} (ref <1.30)

## 2014-10-06 LAB — TESTOSTERONE, FREE, TOTAL, SHBG
Sex Hormone Binding: 39 nmol/L (ref 10–50)
Testosterone, Free: 65.5 pg/mL (ref 47.0–244.0)
Testosterone-% Free: 1.8 % (ref 1.6–2.9)
Testosterone: 362 ng/dL (ref 300–890)

## 2014-10-06 LAB — METHYLMALONIC ACID, SERUM: Methylmalonic Acid, Quant: 220 nmol/L (ref 87–318)

## 2014-10-28 ENCOUNTER — Other Ambulatory Visit: Payer: Self-pay | Admitting: Internal Medicine

## 2014-11-01 ENCOUNTER — Ambulatory Visit: Payer: BLUE CROSS/BLUE SHIELD

## 2014-11-15 ENCOUNTER — Other Ambulatory Visit: Payer: Self-pay | Admitting: Cardiovascular Disease

## 2014-12-21 ENCOUNTER — Other Ambulatory Visit: Payer: BLUE CROSS/BLUE SHIELD

## 2014-12-21 ENCOUNTER — Ambulatory Visit: Payer: BLUE CROSS/BLUE SHIELD

## 2014-12-21 DIAGNOSIS — F319 Bipolar disorder, unspecified: Secondary | ICD-10-CM

## 2014-12-21 DIAGNOSIS — B20 Human immunodeficiency virus [HIV] disease: Secondary | ICD-10-CM

## 2014-12-21 DIAGNOSIS — Z79899 Other long term (current) drug therapy: Secondary | ICD-10-CM

## 2014-12-21 LAB — LIPID PANEL
Cholesterol: 149 mg/dL (ref 125–200)
HDL: 50 mg/dL (ref >=40)
LDL Cholesterol: 69 mg/dL (ref <130)
Total CHOL/HDL Ratio: 3 Ratio (ref <=5.0)
Triglycerides: 148 mg/dL (ref <150)
VLDL: 30 mg/dL (ref <30)

## 2014-12-21 LAB — COMPLETE METABOLIC PANEL WITH GFR
ALT: 23 U/L (ref 9–46)
AST: 24 U/L (ref 10–35)
Albumin: 4.2 g/dL (ref 3.6–5.1)
Alkaline Phosphatase: 56 U/L (ref 40–115)
BUN: 15 mg/dL (ref 7–25)
CO2: 27 mmol/L (ref 20–31)
Calcium: 9.4 mg/dL (ref 8.6–10.3)
Chloride: 103 mmol/L (ref 98–110)
Creat: 1.07 mg/dL (ref 0.70–1.33)
GFR, Est African American: >89 mL/min (ref >=60)
GFR, Est Non African American: 79 mL/min (ref >=60)
Glucose, Bld: 58 mg/dL — ABNORMAL LOW (ref 65–99)
Potassium: 5.2 mmol/L (ref 3.5–5.3)
Sodium: 138 mmol/L (ref 135–146)
Total Bilirubin: 0.7 mg/dL (ref 0.2–1.2)
Total Protein: 7 g/dL (ref 6.1–8.1)

## 2014-12-21 LAB — CBC WITH DIFFERENTIAL/PLATELET
Basophils Absolute: 0 10*3/uL (ref 0.0–0.1)
Basophils Relative: 0 % (ref 0–1)
Eosinophils Absolute: 0.1 10*3/uL (ref 0.0–0.7)
Eosinophils Relative: 2 % (ref 0–5)
HCT: 46.5 % (ref 39.0–52.0)
Hemoglobin: 16.5 g/dL (ref 13.0–17.0)
Lymphocytes Relative: 30 % (ref 12–46)
Lymphs Abs: 2 10*3/uL (ref 0.7–4.0)
MCH: 35.6 pg — ABNORMAL HIGH (ref 26.0–34.0)
MCHC: 35.5 g/dL (ref 30.0–36.0)
MCV: 100.4 fL — ABNORMAL HIGH (ref 78.0–100.0)
MPV: 9.7 fL (ref 8.6–12.4)
Monocytes Absolute: 0.6 10*3/uL (ref 0.1–1.0)
Monocytes Relative: 9 % (ref 3–12)
Neutro Abs: 4 10*3/uL (ref 1.7–7.7)
Neutrophils Relative %: 59 % (ref 43–77)
Platelets: 211 10*3/uL (ref 150–400)
RBC: 4.63 MIL/uL (ref 4.22–5.81)
RDW: 13.1 % (ref 11.5–15.5)
WBC: 6.8 10*3/uL (ref 4.0–10.5)

## 2014-12-21 NOTE — BH Specialist Note (Signed)
[name] Smith was in a fairly good mood today, but continues to express his frustration over how long it is taking to get disability.  He said he called them yesterday and found that they have dropped the amount he would get from $1700/month to $1300/month.  This angered him because the reason is he hasn't made any money in the last 3 years.  He also reminded me that we had talked about ADHD a while back and even had him take an online test for it, for which he scored rather high.  I agreed to talk to his doctor about this.  Plan to meet again in 4 weeks. [name] FortsKenny Florinda Taflinger, LCSW

## 2014-12-22 LAB — T-HELPER CELL (CD4) - (RCID CLINIC ONLY)
CD4 % Helper T Cell: 42 % (ref 33–55)
CD4 T Cell Abs: 990 /uL (ref 400–2700)

## 2014-12-24 LAB — HIV-1 RNA QUANT-NO REFLEX-BLD
HIV 1 RNA Quant: <20 copies/mL (ref <20)
HIV-1 RNA Quant, Log: <1.3 {Log} (ref <1.30)

## 2015-01-04 ENCOUNTER — Ambulatory Visit (INDEPENDENT_AMBULATORY_CARE_PROVIDER_SITE_OTHER): Payer: BLUE CROSS/BLUE SHIELD | Admitting: Internal Medicine

## 2015-01-04 ENCOUNTER — Encounter: Payer: Self-pay | Admitting: Internal Medicine

## 2015-01-04 VITALS — BP 147/83 | HR 98 | Temp 97.5°F | Wt 159.0 lb

## 2015-01-04 DIAGNOSIS — I10 Essential (primary) hypertension: Secondary | ICD-10-CM

## 2015-01-04 DIAGNOSIS — J441 Chronic obstructive pulmonary disease with (acute) exacerbation: Secondary | ICD-10-CM | POA: Diagnosis not present

## 2015-01-04 DIAGNOSIS — B20 Human immunodeficiency virus [HIV] disease: Secondary | ICD-10-CM | POA: Diagnosis not present

## 2015-01-04 DIAGNOSIS — I25700 Atherosclerosis of coronary artery bypass graft(s), unspecified, with unstable angina pectoris: Secondary | ICD-10-CM

## 2015-01-04 DIAGNOSIS — Z23 Encounter for immunization: Secondary | ICD-10-CM | POA: Diagnosis not present

## 2015-01-04 MED ORDER — TIOTROPIUM BROMIDE MONOHYDRATE 18 MCG IN CAPS: 18.0000 ug | ORAL_CAPSULE | Freq: Every day | RESPIRATORY_TRACT | Status: DC

## 2015-01-04 MED ORDER — ALBUTEROL SULFATE HFA 108 (90 BASE) MCG/ACT IN AERS: 2.0000 | INHALATION_SPRAY | Freq: Four times a day (QID) | RESPIRATORY_TRACT | Status: DC | PRN

## 2015-01-04 NOTE — Progress Notes (Signed)
Patient ID: [name] Smith, male   DOB: May 18, 1960, 54 y.o.   MRN: [id number]       Patient ID: [name] Smith, male   DOB: June 18, 1960, 54 y.o.   MRN: [id number]  HPI 53yo M with HIV disease, HLD, CAD s/p CABG, COPD, anxiety. HIV is well controlled. Recent Cd 4 count 990/VL<20 on stribild.takes his medications routinely. He notices more fatigue of late. Denies worsening of shortness of breath but at this baseline with exertional dyspnea. No recent illnesses.still awaiting disability process  Reviewed his visits with his pcp   Outpatient Encounter Prescriptions as of 01/04/2015  Medication Sig  . albuterol (PROVENTIL HFA;VENTOLIN HFA) 108 (90 BASE) MCG/ACT inhaler Inhale 2 puffs into the lungs every 6 (six) hours as needed for wheezing or shortness of breath.  . ARIPiprazole (ABILIFY) 15 MG tablet Take 1 tablet (15 mg total) by mouth daily.  Marland Kitchen aspirin 325 MG EC tablet Take 325 mg by mouth daily.    Marland Kitchen buPROPion (WELLBUTRIN) 100 MG tablet Take 1 tablet (100 mg total) by mouth daily.  . clopidogrel (PLAVIX) 75 MG tablet TAKE 1 TABLET (75 MG TOTAL) BY MOUTH DAILY.  Marland Kitchen CRESTOR 20 MG tablet TAKE 1 TABLET (20 MG TOTAL) BY MOUTH DAILY.  Marland Kitchen elvitegravir-cobicistat-emtricitabine-tenofovir (STRIBILD) 150-150-200-300 MG TABS tablet Take 1 tablet by mouth daily with breakfast.  . ibuprofen (ADVIL,MOTRIN) 600 MG tablet Take 1 tablet (600 mg total) by mouth every 6 (six) hours as needed for pain.  . metoprolol succinate (TOPROL-XL) 50 MG 24 hr tablet TAKE 1 TABLET BY MOUTH EVERY DAY  . nitroGLYCERIN (NITROSTAT) 0.4 MG SL tablet Place 1 tablet (0.4 mg total) under the tongue every 5 (five) minutes as needed. For chest pain -- may repeat times three  . sildenafil (VIAGRA) 50 MG tablet Take 1 tablet (50 mg total) by mouth daily as needed.  . tiotropium (SPIRIVA HANDIHALER) 18 MCG inhalation capsule Place 1 capsule (18 mcg total) into inhaler and inhale daily.  . traMADol (ULTRAM) 50 MG tablet Take 1 tablet  (50 mg total) by mouth every 6 (six) hours as needed.  . triamcinolone cream (KENALOG) 0.1 % Apply topically 2 (two) times daily.  . valACYclovir (VALTREX) 1000 MG tablet Take 1 tablet (1,000 mg total) by mouth 3 (three) times daily.   No facility-administered encounter medications on file as of 01/04/2015.     Patient Active Problem List   Diagnosis Date Noted  . Asthmatic bronchitis with acute exacerbation 01/03/2014  . Encounter for disability assessment 06/08/2013  . Murmur 10/14/2011  . URI (upper respiratory infection) 04/29/2011  . Bruit 10/28/2010  . SMOKER 11/06/2008  . COUGH 11/06/2008  . HYPERCHOLESTEROLEMIA 11/03/2008  . ANXIETY 11/03/2008  . Essential hypertension 11/03/2008  . GERD 11/03/2008  . NEPHROLITHIASIS 11/03/2008  . HIV INFECTION 03/20/2006  . CORONARY ARTERY DISEASE 03/20/2006  . CORONARY ARTERY BYPASS GRAFT, THREE VESSEL, HX OF 03/20/2006     Health Maintenance Due  Topic Date Due  . TETANUS/TDAP  03/04/1980  . COLONOSCOPY  03/05/2011  . INFLUENZA VACCINE  10/09/2014     Review of Systems + fatigue, +DOE at baseline. 10 point ROS is otherwise negative Physical Exam   BP 147/83 mmHg  Pulse 98  Temp(Src) 97.5 F (36.4 C) (Oral)  Wt 159 lb (72.122 kg) Physical Exam  Constitutional: He is oriented to person, place, and time. He appears well-developed and well-nourished. No distress.  HENT:  Mouth/Throat: Oropharynx is clear and moist. No oropharyngeal exudate.  Cardiovascular:  Normal rate, regular rhythm and normal heart sounds. Exam reveals no gallop and no friction rub.  No murmur heard.  Pulmonary/Chest: Effort normal and breath sounds normal. No respiratory distress. He has no wheezes.  Lymphadenopathy:  no cervical adenopathy.  Neurological: He is alert and oriented to person, place, and time.  Skin: Skin is warm and dry. No rash noted. No erythema.  Psychiatric: He has a normal mood and affect. His behavior is normal.    Lab Results    Component Value Date   CD4TCELL 42 12/21/2014   Lab Results  Component Value Date   CD4TABS 990 12/21/2014   CD4TABS 860 10/03/2014   CD4TABS 740 06/27/2014   Lab Results  Component Value Date   HIV1RNAQUANT <20 12/21/2014   Lab Results  Component Value Date   HEPBSAB Yes 05/04/2006   No results found for: RPR  CBC Lab Results  Component Value Date   WBC 6.8 12/21/2014   RBC 4.63 12/21/2014   HGB 16.5 12/21/2014   HCT 46.5 12/21/2014   PLT 211 12/21/2014   MCV 100.4* 12/21/2014   MCH 35.6* 12/21/2014   MCHC 35.5 12/21/2014   RDW 13.1 12/21/2014   LYMPHSABS 2.0 12/21/2014   MONOABS 0.6 12/21/2014   EOSABS 0.1 12/21/2014   BASOSABS 0.0 12/21/2014   BMET Lab Results  Component Value Date   NA 138 12/21/2014   K 5.2 12/21/2014   CL 103 12/21/2014   CO2 27 12/21/2014   GLUCOSE 58* 12/21/2014   BUN 15 12/21/2014   CREATININE 1.07 12/21/2014   CALCIUM 9.4 12/21/2014   GFRNONAA 79 12/21/2014   GFRAA >89 12/21/2014     Assessment and Plan  hiv disease = well controlled, will continue on stribild  Health maintenance = will give flu and pneumococcal vaccine  CAD = continue on current regimen   COPD= continue on current regimen with spiriva and albuterol inhaler  htn = elevated this am, but he has not taken his meds yet.

## 2015-01-18 ENCOUNTER — Ambulatory Visit: Payer: BLUE CROSS/BLUE SHIELD

## 2015-01-18 DIAGNOSIS — F319 Bipolar disorder, unspecified: Secondary | ICD-10-CM

## 2015-01-18 NOTE — BH Specialist Note (Signed)
[name] NeedleMichael continues to report that he hasn't heard from Disability.  He talked about the election some and said he wants to move on and think about positive things.  He said he visits with an elderly neighbor and she was upset about the election, but he tried to comfort her and assure her that things will go on.  I told him I had spoken with Dr. Drue Smith about [name] Smith, for his ADHD, and we spoke with a nurse about reminding her of this.  Plan to meet again next month. [name] FortsKenny Lem Peary, LCSW

## 2015-01-26 ENCOUNTER — Other Ambulatory Visit: Payer: Self-pay | Admitting: Internal Medicine

## 2015-01-30 ENCOUNTER — Telehealth: Payer: Self-pay | Admitting: *Deleted

## 2015-01-30 NOTE — Telephone Encounter (Signed)
Patient called to check and see if Dr Drue SecondSnider and Bernette RedbirdKenny has spoken about him getting Strattera. He advised he is waiting for something to be called in. Advised not sure but will ask the doctor if they have spoken and how she feels about the patient having a Rx for the medication and call him back once she makes a decision. The patient reminded me that he has been waiting for a long time and his anxiety/depression is starting to get bothersome. Advised him he could always go to behavioral health if it gets to bad but will get the doctor a message today.

## 2015-02-09 ENCOUNTER — Other Ambulatory Visit: Payer: Self-pay | Admitting: Cardiovascular Disease

## 2015-02-14 ENCOUNTER — Other Ambulatory Visit: Payer: Self-pay | Admitting: Internal Medicine

## 2015-02-14 DIAGNOSIS — E785 Hyperlipidemia, unspecified: Secondary | ICD-10-CM

## 2015-02-19 ENCOUNTER — Ambulatory Visit: Payer: BLUE CROSS/BLUE SHIELD

## 2015-03-13 ENCOUNTER — Ambulatory Visit: Payer: BLUE CROSS/BLUE SHIELD

## 2015-03-13 DIAGNOSIS — F319 Bipolar disorder, unspecified: Secondary | ICD-10-CM

## 2015-03-13 NOTE — BH Specialist Note (Signed)
[name] Smith was in a fairly pleasant mood today, reporting that he had a low key holiday and avoided getting out and "getting into any trouble".  He said "those days are over".  He continues to say he struggles with ADHD symptoms and asked about getting a copy of the test he took last year, so he could give it to his medical doctor.  Overall, he seems to be doing well today.  Plan to meet again in 4 weeks. Franne FortsKenny Keari Miu, LCSW

## 2015-03-27 ENCOUNTER — Other Ambulatory Visit: Payer: Self-pay | Admitting: *Deleted

## 2015-03-27 ENCOUNTER — Ambulatory Visit: Payer: BLUE CROSS/BLUE SHIELD

## 2015-03-27 ENCOUNTER — Other Ambulatory Visit: Payer: Self-pay | Admitting: Cardiovascular Disease

## 2015-03-27 ENCOUNTER — Telehealth: Payer: Self-pay | Admitting: Cardiovascular Disease

## 2015-03-27 MED ORDER — CLOPIDOGREL BISULFATE 75 MG PO TABS: ORAL_TABLET | ORAL | Status: DC

## 2015-03-27 NOTE — Telephone Encounter (Signed)
Patient calling the office for samples of medication:   1.  What medication and dosage are you requesting samples for?Plavix   2.  Are you currently out of this medication? yes

## 2015-03-28 ENCOUNTER — Other Ambulatory Visit: Payer: Self-pay | Admitting: Cardiovascular Disease

## 2015-03-28 ENCOUNTER — Encounter: Payer: Self-pay | Admitting: Internal Medicine

## 2015-03-28 ENCOUNTER — Ambulatory Visit (INDEPENDENT_AMBULATORY_CARE_PROVIDER_SITE_OTHER): Payer: BLUE CROSS/BLUE SHIELD | Admitting: Internal Medicine

## 2015-03-28 DIAGNOSIS — J069 Acute upper respiratory infection, unspecified: Secondary | ICD-10-CM | POA: Diagnosis not present

## 2015-03-28 MED ORDER — DOXYCYCLINE HYCLATE 100 MG PO CAPS: 100.0000 mg | ORAL_CAPSULE | Freq: Two times a day (BID) | ORAL | Status: DC

## 2015-03-28 NOTE — Progress Notes (Signed)
Patient Active Problem List   Diagnosis Date Noted  . Asthmatic bronchitis with acute exacerbation 01/03/2014  . Encounter for disability assessment 06/08/2013  . Murmur 10/14/2011  . URI (upper respiratory infection) 04/29/2011  . Bruit 10/28/2010  . SMOKER 11/06/2008  . COUGH 11/06/2008  . HYPERCHOLESTEROLEMIA 11/03/2008  . ANXIETY 11/03/2008  . Essential hypertension 11/03/2008  . GERD 11/03/2008  . NEPHROLITHIASIS 11/03/2008  . HIV INFECTION 03/20/2006  . CORONARY ARTERY DISEASE 03/20/2006  . CORONARY ARTERY BYPASS GRAFT, THREE VESSEL, HX OF 03/20/2006    Patient's Medications  New Prescriptions   DOXYCYCLINE (VIBRAMYCIN) 100 MG CAPSULE    Take 1 capsule (100 mg total) by mouth 2 (two) times daily.  Previous Medications   ALBUTEROL (PROVENTIL HFA;VENTOLIN HFA) 108 (90 BASE) MCG/ACT INHALER    Inhale 2 puffs into the lungs every 6 (six) hours as needed for wheezing or shortness of breath.   ARIPIPRAZOLE (ABILIFY) 15 MG TABLET    TAKE 1 TABLET EVERY DAY   ASPIRIN 325 MG EC TABLET    Take 325 mg by mouth daily.     BUPROPION (WELLBUTRIN) 100 MG TABLET    Take 1 tablet (100 mg total) by mouth daily.   CLOPIDOGREL (PLAVIX) 75 MG TABLET    TAKE 1 TABLET (75 MG TOTAL) BY MOUTH DAILY. Please Schedule Follow Up Appointment   ELVITEGRAVIR-COBICISTAT-EMTRICITABINE-TENOFOVIR (STRIBILD) 150-150-200-300 MG TABS TABLET    Take 1 tablet by mouth daily with breakfast.   IBUPROFEN (ADVIL,MOTRIN) 600 MG TABLET    Take 1 tablet (600 mg total) by mouth every 6 (six) hours as needed for pain.   METOPROLOL SUCCINATE (TOPROL-XL) 50 MG 24 HR TABLET    TAKE 1 TABLET BY MOUTH EVERY DAY   NITROGLYCERIN (NITROSTAT) 0.4 MG SL TABLET    Place 1 tablet (0.4 mg total) under the tongue every 5 (five) minutes as needed. For chest pain -- may repeat times three   ROSUVASTATIN (CRESTOR) 20 MG TABLET    TAKE 1 TABLET (20 MG TOTAL) BY MOUTH DAILY.   SILDENAFIL (VIAGRA) 50 MG TABLET    Take 1 tablet (50  mg total) by mouth daily as needed.   TIOTROPIUM (SPIRIVA HANDIHALER) 18 MCG INHALATION CAPSULE    Place 1 capsule (18 mcg total) into inhaler and inhale daily.   TRAMADOL (ULTRAM) 50 MG TABLET    Take 1 tablet (50 mg total) by mouth every 6 (six) hours as needed.   TRIAMCINOLONE CREAM (KENALOG) 0.1 %    Apply topically 2 (two) times daily.   VALACYCLOVIR (VALTREX) 1000 MG TABLET    Take 1 tablet (1,000 mg total) by mouth 3 (three) times daily.  Modified Medications   No medications on file  Discontinued Medications   No medications on file    Subjective: [name] Smith is seen on a work in basis. He has been struggling with sinus congestion for the past week and has been getting worse. He is now having congestion, subjective fevers, and cough productive of green sputum. He has some slight worsening of his chronic dyspnea on exertion. He has been wheezing more and using his inhaler more in the past week. He did quit smoking a little over one year ago.   Review of Systems: Review of Systems  Constitutional: Positive for fever and malaise/fatigue. Negative for chills, weight loss and diaphoresis.  HENT: Positive for congestion. Negative for sore throat.   Respiratory: Positive for cough, sputum production, shortness of breath  and wheezing.   Cardiovascular: Negative for chest pain.  Gastrointestinal: Negative for nausea, vomiting and diarrhea.  Genitourinary: Negative for dysuria.  Musculoskeletal: Negative for joint pain.  Skin: Negative for rash.  Neurological: Negative for headaches.  Psychiatric/Behavioral: The patient is nervous/anxious.     Past Medical History  Diagnosis Date  . Hypertension   . Coronary atherosclerosis of unspecified type of vessel, native or graft   . Pure hypercholesterolemia   . Need for prophylactic vaccination and inoculation against influenza   . Human immunodeficiency virus (HIV) disease (HCC)   . Calculus of kidney   . Esophageal reflux   . Anxiety state,  unspecified     Social History  Substance Use Topics  . Smoking status: Former Smoker -- 1.00 packs/day for 36 years    Types: Cigarettes    Quit date: 12/12/2013  . Smokeless tobacco: Never Used  . Alcohol Use: 4.2 oz/week    7 Standard drinks or equivalent per week     Comment: wine    Family History  Problem Relation Age of Onset  . Arthritis Mother     Allergies  Allergen Reactions  . Azithromycin Rash    Objective:  Filed Vitals:   03/28/15 1515  BP: 166/89  Pulse: 96  Weight: 156 lb (70.761 kg)   Body mass index is 23.73 kg/(m^2).  Physical Exam  Constitutional: He is oriented to person, place, and time. No distress.  He sounds very congested  HENT:  Mouth/Throat: No oropharyngeal exudate.  Cardiovascular: Normal rate and regular rhythm.   Murmur heard. He has a 3/6 holosystolic murmur.  Pulmonary/Chest: Effort normal. He has wheezes. He has no rales.  Neurological: He is oriented to person, place, and time.  Skin: No rash noted.  Psychiatric: Mood and affect normal.    Lab Results Lab Results  Component Value Date   WBC 6.8 12/21/2014   HGB 16.5 12/21/2014   HCT 46.5 12/21/2014   MCV 100.4* 12/21/2014   PLT 211 12/21/2014    Lab Results  Component Value Date   CREATININE 1.07 12/21/2014   BUN 15 12/21/2014   NA 138 12/21/2014   K 5.2 12/21/2014   CL 103 12/21/2014   CO2 27 12/21/2014    Lab Results  Component Value Date   ALT 23 12/21/2014   AST 24 12/21/2014   ALKPHOS 56 12/21/2014   BILITOT 0.7 12/21/2014    Lab Results  Component Value Date   CHOL 149 12/21/2014   HDL 50 12/21/2014   LDLCALC 69 12/21/2014   LDLDIRECT 143.2 04/23/2010   TRIG 148 12/21/2014   CHOLHDL 3.0 12/21/2014    Lab Results HIV 1 RNA QUANT (copies/mL)  Date Value  12/21/2014 <20  10/03/2014 <20  06/27/2014 <20   CD4 T CELL ABS (/uL)  Date Value  12/21/2014 990  10/03/2014 860  06/27/2014 740      Problem List Items Addressed This Visit        Unprioritized   URI (upper respiratory infection)    He has an acute upper respiratory tract infection with purulent sinusitis. I recommended generic Afrin nasal spray 2 puffs in each nostril 2-3 times daily for the next 3-4 days and doxycycline. I warned him about possible photosensitivity.      Relevant Medications   doxycycline (VIBRAMYCIN) 100 MG capsule        Cliffton Asters, MD [facility] [phone] pager   [phone] cell 03/28/2015, 3:44 PM

## 2015-03-28 NOTE — Assessment & Plan Note (Signed)
He has an acute upper respiratory tract infection with purulent sinusitis. I recommended generic Afrin nasal spray 2 puffs in each nostril 2-3 times daily for the next 3-4 days and doxycycline. I warned him about possible photosensitivity.

## 2015-04-10 ENCOUNTER — Ambulatory Visit: Payer: BLUE CROSS/BLUE SHIELD

## 2015-04-16 ENCOUNTER — Telehealth: Payer: Self-pay

## 2015-04-16 NOTE — Telephone Encounter (Signed)
Returned call to patient regarding orange card; left msg on pt's machine with contact information for orange card appointments ([phone]) also left Diane's number as a resource person in regards to orange card

## 2015-04-19 ENCOUNTER — Other Ambulatory Visit: Payer: BLUE CROSS/BLUE SHIELD

## 2015-04-20 ENCOUNTER — Other Ambulatory Visit (INDEPENDENT_AMBULATORY_CARE_PROVIDER_SITE_OTHER): Payer: BLUE CROSS/BLUE SHIELD

## 2015-04-20 DIAGNOSIS — B20 Human immunodeficiency virus [HIV] disease: Secondary | ICD-10-CM

## 2015-04-20 LAB — COMPLETE METABOLIC PANEL WITH GFR
ALT: 24 U/L (ref 9–46)
AST: 27 U/L (ref 10–35)
Albumin: 4.3 g/dL (ref 3.6–5.1)
Alkaline Phosphatase: 66 U/L (ref 40–115)
BUN: 13 mg/dL (ref 7–25)
CO2: 27 mmol/L (ref 20–31)
Calcium: 9.8 mg/dL (ref 8.6–10.3)
Chloride: 102 mmol/L (ref 98–110)
Creat: 1.12 mg/dL (ref 0.70–1.33)
GFR, Est African American: 86 mL/min (ref >=60)
GFR, Est Non African American: 74 mL/min (ref >=60)
Glucose, Bld: 98 mg/dL (ref 65–99)
Potassium: 5.2 mmol/L (ref 3.5–5.3)
Sodium: 136 mmol/L (ref 135–146)
Total Bilirubin: 0.7 mg/dL (ref 0.2–1.2)
Total Protein: 7.2 g/dL (ref 6.1–8.1)

## 2015-04-20 LAB — T-HELPER CELL (CD4) - (RCID CLINIC ONLY)
CD4 % Helper T Cell: 43 % (ref 33–55)
CD4 T Cell Abs: 1030 /uL (ref 400–2700)

## 2015-04-20 LAB — CBC WITH DIFFERENTIAL/PLATELET
Basophils Absolute: 0 10*3/uL (ref 0.0–0.1)
Basophils Relative: 0 % (ref 0–1)
Eosinophils Absolute: 0.1 10*3/uL (ref 0.0–0.7)
Eosinophils Relative: 2 % (ref 0–5)
HCT: 46.9 % (ref 39.0–52.0)
Hemoglobin: 16.3 g/dL (ref 13.0–17.0)
Lymphocytes Relative: 32 % (ref 12–46)
Lymphs Abs: 2.3 10*3/uL (ref 0.7–4.0)
MCH: 34.9 pg — ABNORMAL HIGH (ref 26.0–34.0)
MCHC: 34.8 g/dL (ref 30.0–36.0)
MCV: 100.4 fL — ABNORMAL HIGH (ref 78.0–100.0)
MPV: 10 fL (ref 8.6–12.4)
Monocytes Absolute: 0.4 10*3/uL (ref 0.1–1.0)
Monocytes Relative: 6 % (ref 3–12)
Neutro Abs: 4.3 10*3/uL (ref 1.7–7.7)
Neutrophils Relative %: 60 % (ref 43–77)
Platelets: 216 10*3/uL (ref 150–400)
RBC: 4.67 MIL/uL (ref 4.22–5.81)
RDW: 12.9 % (ref 11.5–15.5)
WBC: 7.2 10*3/uL (ref 4.0–10.5)

## 2015-04-23 LAB — HIV-1 RNA QUANT-NO REFLEX-BLD
HIV 1 RNA Quant: <20 copies/mL (ref <20)
HIV-1 RNA Quant, Log: <1.3 Log copies/mL (ref <1.30)

## 2015-05-02 ENCOUNTER — Ambulatory Visit: Payer: BLUE CROSS/BLUE SHIELD | Attending: Internal Medicine

## 2015-05-03 ENCOUNTER — Ambulatory Visit (INDEPENDENT_AMBULATORY_CARE_PROVIDER_SITE_OTHER): Payer: BLUE CROSS/BLUE SHIELD | Admitting: Internal Medicine

## 2015-05-03 ENCOUNTER — Encounter: Payer: Self-pay | Admitting: Internal Medicine

## 2015-05-03 VITALS — BP 128/76 | HR 89 | Temp 98.2°F | Ht 69.0 in | Wt 160.0 lb

## 2015-05-03 DIAGNOSIS — B20 Human immunodeficiency virus [HIV] disease: Secondary | ICD-10-CM

## 2015-05-03 DIAGNOSIS — I25700 Atherosclerosis of coronary artery bypass graft(s), unspecified, with unstable angina pectoris: Secondary | ICD-10-CM

## 2015-05-03 DIAGNOSIS — J441 Chronic obstructive pulmonary disease with (acute) exacerbation: Secondary | ICD-10-CM

## 2015-05-03 NOTE — Progress Notes (Signed)
Patient ID: [name] Smith, male   DOB: 10-Jun-1960, 55 y.o.   MRN: [id number]      RFV: routine quarterly visit for hiv disease  Patient ID: [name] Smith, male   DOB: March 27, 1960, 55 y.o.   MRN: [id number]  HPI 54yo M with HIV disease, CD 4 count 1030/VL<20, on stribild. Will be running out of insurance since he can no longer afford $900/mo premium through [employer]. He is transitioning to [employer] but several of his medications unable to afford.  Outpatient Encounter Prescriptions as of 05/03/2015  Medication Sig  . albuterol (PROVENTIL HFA;VENTOLIN HFA) 108 (90 BASE) MCG/ACT inhaler Inhale 2 puffs into the lungs every 6 (six) hours as needed for wheezing or shortness of breath.  . ARIPiprazole (ABILIFY) 15 MG tablet TAKE 1 TABLET EVERY DAY  . aspirin 325 MG EC tablet Take 325 mg by mouth daily.    Marland Kitchen buPROPion (WELLBUTRIN) 100 MG tablet Take 1 tablet (100 mg total) by mouth daily.  . clopidogrel (PLAVIX) 75 MG tablet TAKE 1 TABLET (75 MG TOTAL) BY MOUTH DAILY. Please Schedule Follow Up Appointment  . doxycycline (VIBRAMYCIN) 100 MG capsule Take 1 capsule (100 mg total) by mouth 2 (two) times daily.  Marland Kitchen elvitegravir-cobicistat-emtricitabine-tenofovir (STRIBILD) 150-150-200-300 MG TABS tablet Take 1 tablet by mouth daily with breakfast.  . ibuprofen (ADVIL,MOTRIN) 600 MG tablet Take 1 tablet (600 mg total) by mouth every 6 (six) hours as needed for pain.  . metoprolol succinate (TOPROL-XL) 50 MG 24 hr tablet TAKE 1 TABLET BY MOUTH EVERY DAY  . nitroGLYCERIN (NITROSTAT) 0.4 MG SL tablet Place 1 tablet (0.4 mg total) under the tongue every 5 (five) minutes as needed. For chest pain -- may repeat times three  . rosuvastatin (CRESTOR) 20 MG tablet TAKE 1 TABLET (20 MG TOTAL) BY MOUTH DAILY.  . sildenafil (VIAGRA) 50 MG tablet Take 1 tablet (50 mg total) by mouth daily as needed.  . tiotropium (SPIRIVA HANDIHALER) 18 MCG inhalation capsule Place 1 capsule (18 mcg total) into inhaler and inhale daily.    . traMADol (ULTRAM) 50 MG tablet Take 1 tablet (50 mg total) by mouth every 6 (six) hours as needed.  . triamcinolone cream (KENALOG) 0.1 % Apply topically 2 (two) times daily.  . valACYclovir (VALTREX) 1000 MG tablet Take 1 tablet (1,000 mg total) by mouth 3 (three) times daily.   No facility-administered encounter medications on file as of 05/03/2015.     Patient Active Problem List   Diagnosis Date Noted  . Asthmatic bronchitis with acute exacerbation 01/03/2014  . Encounter for disability assessment 06/08/2013  . Murmur 10/14/2011  . URI (upper respiratory infection) 04/29/2011  . Bruit 10/28/2010  . SMOKER 11/06/2008  . COUGH 11/06/2008  . HYPERCHOLESTEROLEMIA 11/03/2008  . ANXIETY 11/03/2008  . Essential hypertension 11/03/2008  . GERD 11/03/2008  . NEPHROLITHIASIS 11/03/2008  . HIV INFECTION 03/20/2006  . CORONARY ARTERY DISEASE 03/20/2006  . CORONARY ARTERY BYPASS GRAFT, THREE VESSEL, HX OF 03/20/2006     Health Maintenance Due  Topic Date Due  . TETANUS/TDAP  03/04/1980  . COLONOSCOPY  03/05/2011     Review of Systems 10 point ros is negative except for anxiety due to losing health insurance Physical Exam   BP 128/76 mmHg  Pulse 89  Temp(Src) 98.2 F (36.8 C) (Oral)  Ht  (1.753 m)  Wt 160 lb (72.576 kg)  BMI 23.62 kg/m2 Physical Exam  Constitutional: He is oriented to person, place, and time. He appears well-developed  and well-nourished. No distress.  HENT:  Mouth/Throat: Oropharynx is clear and moist. No oropharyngeal exudate.  Cardiovascular: Normal rate, regular rhythm and normal heart sounds. Exam reveals no gallop and no friction rub.  No murmur heard.  Pulmonary/Chest: Effort normal and breath sounds normal. No respiratory distress. He has no wheezes.  Abdominal: Soft. Bowel sounds are normal. He exhibits no distension. There is no tenderness.  Lymphadenopathy:  He has no cervical adenopathy.  Neurological: He is alert and oriented to person,  place, and time.  Skin: Skin is warm and dry. No rash noted. No erythema.  Psychiatric: He has a normal mood and affect. His behavior is normal.    Lab Results  Component Value Date   CD4TCELL 43 04/20/2015   Lab Results  Component Value Date   CD4TABS 1030 04/20/2015   CD4TABS 990 12/21/2014   CD4TABS 860 10/03/2014   Lab Results  Component Value Date   HIV1RNAQUANT <20 04/20/2015   Lab Results  Component Value Date   HEPBSAB Yes 05/04/2006   No results found for: RPR  CBC Lab Results  Component Value Date   WBC 7.2 04/20/2015   RBC 4.67 04/20/2015   HGB 16.3 04/20/2015   HCT 46.9 04/20/2015   PLT 216 04/20/2015   MCV 100.4* 04/20/2015   MCH 34.9* 04/20/2015   MCHC 34.8 04/20/2015   RDW 12.9 04/20/2015   LYMPHSABS 2.3 04/20/2015   MONOABS 0.4 04/20/2015   EOSABS 0.1 04/20/2015   BASOSABS 0.0 04/20/2015   BMET Lab Results  Component Value Date   NA 136 04/20/2015   K 5.2 04/20/2015   CL 102 04/20/2015   CO2 27 04/20/2015   GLUCOSE 98 04/20/2015   BUN 13 04/20/2015   CREATININE 1.12 04/20/2015   CALCIUM 9.8 04/20/2015   GFRNONAA 74 04/20/2015   GFRAA 86 04/20/2015     Assessment and Plan   hiv disease = well controlled. Though concern for loss of insurance. Will get him signed up for [employer] program  CAD = not all meds covered by [employer] white. Will need to do patient assistance  Copd = also need patient assistance for access to spiriva,

## 2015-05-07 ENCOUNTER — Other Ambulatory Visit: Payer: Self-pay | Admitting: *Deleted

## 2015-05-07 DIAGNOSIS — B20 Human immunodeficiency virus [HIV] disease: Secondary | ICD-10-CM

## 2015-05-07 DIAGNOSIS — N529 Male erectile dysfunction, unspecified: Secondary | ICD-10-CM

## 2015-05-07 DIAGNOSIS — E785 Hyperlipidemia, unspecified: Secondary | ICD-10-CM

## 2015-05-07 DIAGNOSIS — J441 Chronic obstructive pulmonary disease with (acute) exacerbation: Secondary | ICD-10-CM

## 2015-05-07 MED ORDER — SILDENAFIL CITRATE 50 MG PO TABS: 50.0000 mg | ORAL_TABLET | Freq: Every day | ORAL | Status: DC | PRN

## 2015-05-07 MED ORDER — ALBUTEROL SULFATE HFA 108 (90 BASE) MCG/ACT IN AERS: 2.0000 | INHALATION_SPRAY | Freq: Four times a day (QID) | RESPIRATORY_TRACT | Status: DC | PRN

## 2015-05-07 MED ORDER — ROSUVASTATIN CALCIUM 20 MG PO TABS: ORAL_TABLET | ORAL | Status: DC

## 2015-05-07 MED ORDER — ELVITEG-COBIC-EMTRICIT-TENOFDF 150-150-200-300 MG PO TABS: 1.0000 | ORAL_TABLET | Freq: Every day | ORAL | Status: DC

## 2015-05-07 MED ORDER — TIOTROPIUM BROMIDE MONOHYDRATE 18 MCG IN CAPS: 18.0000 ug | ORAL_CAPSULE | Freq: Every day | RESPIRATORY_TRACT | Status: DC

## 2015-05-14 ENCOUNTER — Telehealth: Payer: Self-pay | Admitting: Cardiovascular Disease

## 2015-05-14 NOTE — Telephone Encounter (Signed)
Patient needs a prescription for Plavix sent to a pharmacy. Patient is not sure where to have it sent. Patient will call back with a pharmacy or come pick up paper prescription.

## 2015-05-14 NOTE — Telephone Encounter (Signed)
She is calling,concerning his Plavix.

## 2015-05-14 NOTE — Telephone Encounter (Signed)
[name] Smith with ID is going to give patient an application that he will need to have fill out. Patient will need a doctors signature on application with a paper prescription for him to receive his Plavix with the orange card program. Patient will bring application to our office.

## 2015-05-14 NOTE — Telephone Encounter (Signed)
New Message  Pt calling to speajk w/ rn concerning his plavix RX. Please call back and discuss.

## 2015-05-15 ENCOUNTER — Other Ambulatory Visit: Payer: Self-pay

## 2015-05-15 MED ORDER — CLOPIDOGREL BISULFATE 75 MG PO TABS: ORAL_TABLET | ORAL | Status: DC

## 2015-05-15 NOTE — Addendum Note (Signed)
Addended by: Virl AxePATE INGALLS, Larri Yehle L on: 05/15/2015 12:57 PM   Modules accepted: Orders

## 2015-05-15 NOTE — Telephone Encounter (Signed)
Patient has appointment with Dr. Eden EmmsNishan in May. Will print out prescription. Patient dropped off application for use to fax with his prescription.

## 2015-05-16 ENCOUNTER — Other Ambulatory Visit: Payer: Self-pay | Admitting: Cardiovascular Disease

## 2015-05-16 ENCOUNTER — Ambulatory Visit: Payer: Self-pay

## 2015-05-16 ENCOUNTER — Telehealth: Payer: Self-pay | Admitting: Cardiovascular Disease

## 2015-05-16 DIAGNOSIS — F319 Bipolar disorder, unspecified: Secondary | ICD-10-CM

## 2015-05-16 NOTE — Telephone Encounter (Signed)
Follow up      Calling to check the status on a form that was dropped off yesterday to get assistance with his plavix.  Did you get it?  Pt will take last pill tomorrow.

## 2015-05-16 NOTE — BH Specialist Note (Signed)
[name] DanceKeith was in a pleasant mood today, but reported that he continues to be frustrated with "the system" and the long delay on his disability case.  He said he is trying to get Medicaid.  He talked about tiring easily and having shortness of breath frequently.  He said he also struggles with bouts of depression from time to time.  He tends to isolate socially, choosing to stay at home most of the time, he said. Plan to meet again in 5 weeks. [name] FortsKenny Justyne Roell, LCSW

## 2015-05-16 NOTE — Telephone Encounter (Signed)
Already sent to pharmacy today per [facility]scribe request from [facility].

## 2015-05-16 NOTE — Telephone Encounter (Signed)
New message       *STAT* If patient is at the pharmacy, call can be transferred to refill team.   1. Which medications need to be refilled? (please list name of each medication and dose if known) generic plavix  75mg  2. Which pharmacy/location (including street and city if local pharmacy) is medication to be sent to?[facility] 3. Do they need a 30 day or 90 day supply? Pt cannot afford a full presc.  Pt will take his last pill tomorrow

## 2015-05-21 NOTE — Telephone Encounter (Signed)
Spoke with patient today to let him know his app and Rx for Plavix was faxed today. Dr. Clover MealyNiishan has not been in the office for a few days, which is why it was delayed. He has a few left, and will check with the pharmacist about a few extra pills.

## 2015-06-13 ENCOUNTER — Encounter: Payer: Self-pay | Admitting: Internal Medicine

## 2015-06-18 ENCOUNTER — Encounter (HOSPITAL_COMMUNITY): Payer: Self-pay | Admitting: Emergency Medicine

## 2015-06-18 ENCOUNTER — Telehealth: Payer: Self-pay | Admitting: *Deleted

## 2015-06-18 ENCOUNTER — Ambulatory Visit (HOSPITAL_COMMUNITY)
Admission: EM | Admit: 2015-06-18 | Discharge: 2015-06-18 | Disposition: A | Discharge status: Home / Self Care | Payer: No Typology Code available for payment source | Attending: Emergency Medicine | Admitting: Emergency Medicine

## 2015-06-18 DIAGNOSIS — J329 Chronic sinusitis, unspecified: Secondary | ICD-10-CM

## 2015-06-18 DIAGNOSIS — J40 Bronchitis, not specified as acute or chronic: Secondary | ICD-10-CM

## 2015-06-18 MED ORDER — PREDNISONE 50 MG PO TABS: ORAL_TABLET | ORAL | Status: DC

## 2015-06-18 MED ORDER — DOXYCYCLINE HYCLATE 100 MG PO CAPS: 100.0000 mg | ORAL_CAPSULE | Freq: Two times a day (BID) | ORAL | Status: DC

## 2015-06-18 NOTE — ED Provider Notes (Signed)
CSN: [id number]     Arrival date & time 06/18/15  1840 History   First MD Initiated Contact with Patient 06/18/15 1943     Chief Complaint  Patient presents with  . Facial Pain  . Chills  . Cough   (Consider location/radiation/quality/duration/timing/severity/associated sxs/prior Treatment) HPI  He is a 55 year old man here for evaluation of sinus pain, cough, and subjective fevers. His symptoms started on Friday with nasal congestion, rhinorrhea, sore throat, cough. He is getting some greenish phlegm from both the nose and his cough. He reports subjective fevers and chills. He has a history of chronic sinusitis and has been using his Nettie pot and Mucinex without much improvement. He also reports significant fatigue and some mild body aches. No shortness of breath. He has been using his albuterol inhaler with some improvement. His HIV is well controlled.  Past Medical History  Diagnosis Date  . Hypertension   . Coronary atherosclerosis of unspecified type of vessel, native or graft   . Pure hypercholesterolemia   . Need for prophylactic vaccination and inoculation against influenza   . Human immunodeficiency virus (HIV) disease (HCC)   . Calculus of kidney   . Esophageal reflux   . Anxiety state, unspecified    Past Surgical History  Procedure Laterality Date  . Ptca  2004    1 stent placed  . Coronary artery bypass graft  12/1999, 12/2009    2001 - 4V, 2011 - 4V   Family History  Problem Relation Age of Onset  . Arthritis Mother    Social History  Substance Use Topics  . Smoking status: Former Smoker -- 1.00 packs/day for 36 years    Types: Cigarettes    Quit date: 12/12/2013  . Smokeless tobacco: Never Used  . Alcohol Use: 4.2 oz/week    7 Standard drinks or equivalent per week     Comment: wine    Review of Systems As in history of present illness Allergies  Azithromycin  Home Medications   Prior to Admission medications   Medication Sig Start Date End Date  Taking? Authorizing Provider  albuterol (PROVENTIL HFA;VENTOLIN HFA) 108 (90 Base) MCG/ACT inhaler Inhale 2 puffs into the lungs every 6 (six) hours as needed for wheezing or shortness of breath. 05/07/15  Yes Judyann Munson, MD  ARIPiprazole (ABILIFY) 15 MG tablet TAKE 1 TABLET EVERY DAY 01/26/15  Yes Judyann Munson, MD  aspirin 325 MG EC tablet Take 325 mg by mouth daily.     Yes Historical Provider, MD  buPROPion (WELLBUTRIN) 100 MG tablet Take 1 tablet (100 mg total) by mouth daily. 06/04/12  Yes Lina Sayre, MD  clopidogrel (PLAVIX) 75 MG tablet TAKE 1 TABLET (75 MG TOTAL) BY MOUTH DAILY. 05/15/15  Yes Wendall Stade, MD  elvitegravir-cobicistat-emtricitabine-tenofovir (STRIBILD) 150-150-200-300 MG TABS tablet Take 1 tablet by mouth daily with breakfast. 05/07/15  Yes Judyann Munson, MD  metoprolol succinate (TOPROL-XL) 50 MG 24 hr tablet TAKE 1 TABLET BY MOUTH EVERY DAY 05/06/13  Yes Judyann Munson, MD  rosuvastatin (CRESTOR) 20 MG tablet TAKE 1 TABLET (20 MG TOTAL) BY MOUTH DAILY. 05/07/15  Yes Judyann Munson, MD  sildenafil (VIAGRA) 50 MG tablet Take 1 tablet (50 mg total) by mouth daily as needed. 05/07/15  Yes Judyann Munson, MD  tiotropium (SPIRIVA HANDIHALER) 18 MCG inhalation capsule Place 1 capsule (18 mcg total) into inhaler and inhale daily. 05/07/15  Yes Judyann Munson, MD  clopidogrel (PLAVIX) 75 MG tablet Take 1 tablet (75 mg total) by mouth  daily. 05/16/15   Wendall StadePeter C Nishan, MD  doxycycline (VIBRAMYCIN) 100 MG capsule Take 1 capsule (100 mg total) by mouth 2 (two) times daily. 06/18/15   Charm RingsErin J Espn Zeman, MD  ibuprofen (ADVIL,MOTRIN) 600 MG tablet Take 1 tablet (600 mg total) by mouth every 6 (six) hours as needed for pain. 05/31/12   Roxy Horsemanobert Browning, PA-C  nitroGLYCERIN (NITROSTAT) 0.4 MG SL tablet Place 1 tablet (0.4 mg total) under the tongue every 5 (five) minutes as needed. For chest pain -- may repeat times three 08/11/11   Lina Sayreimothy Lane, MD  predniSONE (DELTASONE) 50 MG tablet Take 1 pill daily  for 5 days. 06/18/15   Charm RingsErin J Nareh Matzke, MD  traMADol (ULTRAM) 50 MG tablet Take 1 tablet (50 mg total) by mouth every 6 (six) hours as needed. 02/27/14   Judyann Munsonynthia Snider, MD  triamcinolone cream (KENALOG) 0.1 % Apply topically 2 (two) times daily. 04/12/13   Judyann Munsonynthia Snider, MD  valACYclovir (VALTREX) 1000 MG tablet Take 1 tablet (1,000 mg total) by mouth 3 (three) times daily. 11/12/12   Judyann Munsonynthia Snider, MD   Meds Ordered and Administered this Visit  Medications - No data to display  BP 126/102 mmHg  Pulse 96  Temp(Src) 99.4 F (37.4 C) (Oral)  Resp 18  SpO2 99% No data found.   Physical Exam  Constitutional: He is oriented to person, place, and time. He appears well-developed and well-nourished. No distress.  HENT:  Mouth/Throat: No oropharyngeal exudate.  Clear postnasal drainage. Nasal mucosa is erythematous and boggy. Sinuses diffusely tender.  Neck: Neck supple.  Cardiovascular: Normal rate, regular rhythm and normal heart sounds.   No murmur heard. Pulmonary/Chest: Effort normal. No respiratory distress. He has wheezes (rare inspiratory squeak). He has no rales.  Lymphadenopathy:    He has no cervical adenopathy.  Neurological: He is alert and oriented to person, place, and time.    ED Course  Procedures (including critical care time)  Labs Review Labs Reviewed - No data to display  Imaging Review No results found.   MDM   1. Other sinusitis   2. Bronchitis    We'll treat with doxycycline and prednisone. Continue symptomatic treatment. Follow-up as needed.    Charm RingsErin J Rashida Ladouceur, MD 06/18/15 2012

## 2015-06-18 NOTE — Telephone Encounter (Signed)
Patient called requesting an urgent appointment because he is feeling badly and thinks he has bronchitis again. Explained that there are no available MDs today and could he possibly see his PCP, Dr. Clelia CroftShaw. He agreed to this plan.

## 2015-06-18 NOTE — Discharge Instructions (Signed)
You have a sinus infection that has moved to bronchitis. Take azithromycin and prednisone as prescribed. Continue your albuterol inhaler as needed. You should start to feel better in 2-3 days. Follow-up as needed.

## 2015-06-18 NOTE — ED Notes (Signed)
[facility] a [facility] pain and pressure and chills x 3 days.

## 2015-06-20 ENCOUNTER — Ambulatory Visit: Payer: Self-pay

## 2015-06-20 DIAGNOSIS — F319 Bipolar disorder, unspecified: Secondary | ICD-10-CM

## 2015-06-20 NOTE — BH Specialist Note (Signed)
[name] came in today reporting that he has had a respiratory infection for the past 3-4 days.  He also appears to be having a reaction to steroids and anti-biotics he started recently, as evidenced by redness on his skin.  I connected him with triage nurse [name] Smith after our session to address this.  He said he had an appointment on Monday related to his disability claim and this man asked lots of questions about what he could and couldn't do physically. We called [name] Smith at [employer] today to make sure she had received paperwork from me and she said she did. Overall, his mood is good today.  Plan to meet again in one month. [name], LCSW

## 2015-07-12 ENCOUNTER — Encounter: Payer: Self-pay | Admitting: Cardiovascular Disease

## 2015-07-12 ENCOUNTER — Ambulatory Visit (INDEPENDENT_AMBULATORY_CARE_PROVIDER_SITE_OTHER): Payer: No Typology Code available for payment source | Admitting: Cardiovascular Disease

## 2015-07-12 VITALS — BP 140/80 | HR 100 | Ht 68.0 in | Wt 157.4 lb

## 2015-07-12 DIAGNOSIS — R011 Cardiac murmur, unspecified: Secondary | ICD-10-CM

## 2015-07-12 DIAGNOSIS — I6523 Occlusion and stenosis of bilateral carotid arteries: Secondary | ICD-10-CM

## 2015-07-12 DIAGNOSIS — R0989 Other specified symptoms and signs involving the circulatory and respiratory systems: Secondary | ICD-10-CM

## 2015-07-12 DIAGNOSIS — R0602 Shortness of breath: Secondary | ICD-10-CM

## 2015-07-12 NOTE — Progress Notes (Signed)
Patient ID: [name] Smith, male   DOB: Aug 15, 1960, 55 y.o.   MRN: [id number]   [name] Smith is seen today for F/U of dyspnea, CAD S/P CABG x2 With redo 10/11. EF has been mildly reduced in the 45% range. No clinical history of CHF. No arryhthmia and no indication for AICD. Still smoking. Clinical COPD. Recent sinus infection with course of cipro.  Marland Kitchen. No SSCP, dyspnea from smoking. NO palpitatons or edema. Lots of stress regarding work and possible disability application. HIV stable and F/U with Dr [name] Smith.  Antiretroviral changed and zocor stopped now on crestor  Having issues with depression ? Bipolar Discussed the fact that he should not take viagra or chantix Counseled at length about smoking cessation   Recent echo 3.15  with mild AS mean gradient 19 mmHg and peak 32 mmHg Carotids with 1-39% bilateral disease  2/15 needs f/u   [name] Smith has been doing poorly lately  The consellation of his HIV, COPD, CAD with two previous bypass surgeries and bipolar disease has disabled him But he cannot get disability. Allergies worse recent prednisone taper . Has more dyspnea No fever  Has orange card now    ROS: Denies fever, malais, weight loss, blurry vision, decreased visual acuity, cough, sputum, SOB, hemoptysis, pleuritic pain, palpitaitons, heartburn, abdominal pain, melena, lower extremity edema, claudication, or rash.  All other systems reviewed and negative  General: Affect agitated depression  Chronically ill  HEENT: normal Neck supple with no adenopathy JVP normal bilateral  bruits no thyromegaly Lungs clear with no wheezing and good diaphragmatic motion Heart:  S1/S2 AS  murmur, no rub, gallop or click PMI normal Abdomen: benighn, BS positve, no tenderness, no AAA no bruit.  No HSM or HJR Distal pulses intact with no bruits No edema Neuro non-focal Skin warm and dry No muscular weakness   Current Outpatient Prescriptions  Medication Sig Dispense Refill  . albuterol (PROVENTIL  HFA;VENTOLIN HFA) 108 (90 Base) MCG/ACT inhaler Inhale 2 puffs into the lungs every 6 (six) hours as needed for wheezing or shortness of breath. 1 Inhaler 12  . ARIPiprazole (ABILIFY) 15 MG tablet Take 15 mg by mouth daily.    Marland Kitchen. aspirin 325 MG EC tablet Take 325 mg by mouth daily.      Marland Kitchen. buPROPion (WELLBUTRIN) 100 MG tablet Take 1 tablet (100 mg total) by mouth daily. 30 tablet 3  . clopidogrel (PLAVIX) 75 MG tablet Take 1 tablet (75 mg total) by mouth daily. 30 tablet 0  . doxycycline (VIBRAMYCIN) 100 MG capsule Take 1 capsule (100 mg total) by mouth 2 (two) times daily. 20 capsule 0  . elvitegravir-cobicistat-emtricitabine-tenofovir (STRIBILD) 150-150-200-300 MG TABS tablet Take 1 tablet by mouth daily with breakfast. 30 tablet 5  . ibuprofen (ADVIL,MOTRIN) 600 MG tablet Take 1 tablet (600 mg total) by mouth every 6 (six) hours as needed for pain. 30 tablet 0  . metoprolol succinate (TOPROL-XL) 50 MG 24 hr tablet TAKE 1 TABLET BY MOUTH EVERY DAY 90 tablet 0  . nitroGLYCERIN (NITROSTAT) 0.4 MG SL tablet Place 1 tablet (0.4 mg total) under the tongue every 5 (five) minutes as needed. For chest pain -- may repeat times three 25 tablet 11  . rosuvastatin (CRESTOR) 20 MG tablet TAKE 1 TABLET (20 MG TOTAL) BY MOUTH DAILY. 30 tablet 5  . sildenafil (VIAGRA) 50 MG tablet Take 50 mg by mouth daily as needed for erectile dysfunction.    Marland Kitchen. tiotropium (SPIRIVA HANDIHALER) 18 MCG inhalation capsule Place 1 capsule (18  mcg total) into inhaler and inhale daily. 30 capsule 12  . traMADol (ULTRAM) 50 MG tablet Take 50 mg by mouth every 6 (six) hours as needed for moderate pain or severe pain.    Marland Kitchen triamcinolone cream (KENALOG) 0.1 % Apply topically 2 (two) times daily. 30 g 1  . valACYclovir (VALTREX) 1000 MG tablet Take 1 tablet (1,000 mg total) by mouth 3 (three) times daily. 21 tablet 1   No current facility-administered medications for this visit.    Allergies  Azithromycin  Electrocardiogram:   SR rate  102 LVH  03/25/13  05/02/14  SR rate 84 LVH no sig change  07/12/15 ST rate 100 LVH   Assessment and Plan CAD/CABG:  X 2 last 2011 stable no angina HIV:  On Stribild f/u ID better interaction with statin COPD:  Stopped  smoking has more dyspnea f/u CXR  Bipolar:  On Abilify mood stable still very stressed about not getting disability Chol:  Now on crestor as HIV med changed  Lab Results  Component Value Date   LDLCALC 69 12/21/2014  Murmur:  F/u echo  Echo 05/2013 mild AS Bruit:  F/u duplex ASA duplex 04/2013 had plaque no stenosis   [name] Smith

## 2015-07-12 NOTE — Patient Instructions (Signed)
Medication Instructions:  Your physician recommends that you continue on your current medications as directed. Please refer to the Current Medication list given to you today.  Labwork: NONE  Testing/Procedures: Your physician has requested that you have a carotid duplex. This test is an ultrasound of the carotid arteries in your neck. It looks at blood flow through these arteries that supply the brain with blood. Allow one hour for this exam. There are no restrictions or special instructions.  A chest x-ray takes a picture of the organs and structures inside the chest, including the heart, lungs, and blood vessels. This test can show several things, including, whether the heart is enlarges; whether fluid is building up in the lungs; and whether pacemaker / defibrillator leads are still in place.  Your physician has requested that you have an echocardiogram. Echocardiography is a painless test that uses sound waves to create images of your heart. It provides your doctor with information about the size and shape of your heart and how well your heart's chambers and valves are working. This procedure takes approximately one hour. There are no restrictions for this procedure.  Follow-Up: Your physician wants you to follow-up in: 3 months with Dr. Eden EmmsNishan.    If you need a refill on your cardiac medications before your next appointment, please call your pharmacy.

## 2015-07-23 ENCOUNTER — Telehealth: Payer: Self-pay | Admitting: *Deleted

## 2015-07-23 NOTE — Telephone Encounter (Signed)
"  Boil" on an unreachable part of his back, has tried heat.  He stated that the "boil" is very painful.   He thought he could come here to have it "lanced."   RN will inform Dr. Drue SecondSnider and see what she wants to do.  Pt is OK with that approach.

## 2015-07-23 NOTE — Telephone Encounter (Signed)
RN spoke with Dr. Drue SecondSnider.  Dr. Drue SecondSnider recommended continuing heat and seeing PCP.  If necessary the pt may go to Urgent Care.  Pt informed of this response.

## 2015-07-24 ENCOUNTER — Ambulatory Visit: Payer: No Typology Code available for payment source

## 2015-07-24 DIAGNOSIS — F319 Bipolar disorder, unspecified: Secondary | ICD-10-CM

## 2015-07-24 NOTE — BH Specialist Note (Signed)
[name] was in a bit of a hurry today, due to having an appointment upstairs in this building shortly after our appointment. He asked that I update a letter about him to give to Disability, which I did.  He reports no major change in his situation except that he is now receiving Medicaid.  Plan to meet again in one month. Franne FortsKenny Linc Renne, LCSW

## 2015-07-31 ENCOUNTER — Other Ambulatory Visit: Payer: Self-pay

## 2015-07-31 ENCOUNTER — Ambulatory Visit (HOSPITAL_BASED_OUTPATIENT_CLINIC_OR_DEPARTMENT_OTHER): Payer: Medicaid Other

## 2015-07-31 ENCOUNTER — Ambulatory Visit (HOSPITAL_COMMUNITY)
Admission: RE | Admit: 2015-07-31 | Discharge: 2015-07-31 | Disposition: A | Discharge status: Home / Self Care | Payer: Medicaid Other | Source: Ambulatory Visit | Attending: Cardiovascular Disease | Admitting: Cardiovascular Disease

## 2015-07-31 DIAGNOSIS — Z720 Tobacco use: Secondary | ICD-10-CM | POA: Insufficient documentation

## 2015-07-31 DIAGNOSIS — R011 Cardiac murmur, unspecified: Secondary | ICD-10-CM

## 2015-07-31 DIAGNOSIS — I10 Essential (primary) hypertension: Secondary | ICD-10-CM | POA: Insufficient documentation

## 2015-07-31 DIAGNOSIS — I082 Rheumatic disorders of both aortic and tricuspid valves: Secondary | ICD-10-CM | POA: Insufficient documentation

## 2015-07-31 DIAGNOSIS — I6523 Occlusion and stenosis of bilateral carotid arteries: Secondary | ICD-10-CM

## 2015-07-31 DIAGNOSIS — R0989 Other specified symptoms and signs involving the circulatory and respiratory systems: Secondary | ICD-10-CM

## 2015-07-31 DIAGNOSIS — E785 Hyperlipidemia, unspecified: Secondary | ICD-10-CM | POA: Insufficient documentation

## 2015-08-23 ENCOUNTER — Other Ambulatory Visit: Payer: BLUE CROSS/BLUE SHIELD

## 2015-08-24 ENCOUNTER — Other Ambulatory Visit: Payer: No Typology Code available for payment source

## 2015-08-24 ENCOUNTER — Other Ambulatory Visit (HOSPITAL_COMMUNITY)
Admission: RE | Admit: 2015-08-24 | Discharge: 2015-08-24 | Disposition: A | Discharge status: Home / Self Care | Payer: Medicaid Other | Source: Ambulatory Visit | Attending: Internal Medicine | Admitting: Internal Medicine

## 2015-08-24 DIAGNOSIS — Z113 Encounter for screening for infections with a predominantly sexual mode of transmission: Secondary | ICD-10-CM | POA: Insufficient documentation

## 2015-08-24 DIAGNOSIS — B20 Human immunodeficiency virus [HIV] disease: Secondary | ICD-10-CM

## 2015-08-24 LAB — T-HELPER CELL (CD4) - (RCID CLINIC ONLY)
CD4 % Helper T Cell: 44 % (ref 33–55)
CD4 T Cell Abs: 1220 /uL (ref 400–2700)

## 2015-08-25 LAB — RPR

## 2015-08-27 LAB — HIV-1 RNA QUANT-NO REFLEX-BLD
HIV 1 RNA Quant: <20 copies/mL (ref <20)
HIV-1 RNA Quant, Log: <1.3 Log copies/mL (ref <1.30)

## 2015-08-28 LAB — URINE CYTOLOGY ANCILLARY ONLY
Chlamydia: NEGATIVE
Neisseria Gonorrhea: NEGATIVE

## 2015-09-04 LAB — HLA B*5701: HLA-B*5701 w/rflx HLA-B High: NEGATIVE

## 2015-09-06 ENCOUNTER — Encounter: Payer: Self-pay | Admitting: Internal Medicine

## 2015-09-06 ENCOUNTER — Ambulatory Visit (INDEPENDENT_AMBULATORY_CARE_PROVIDER_SITE_OTHER): Payer: No Typology Code available for payment source | Admitting: Internal Medicine

## 2015-09-06 VITALS — BP 153/82 | HR 90 | Temp 98.5°F | Ht 68.0 in | Wt 154.0 lb

## 2015-09-06 DIAGNOSIS — I25700 Atherosclerosis of coronary artery bypass graft(s), unspecified, with unstable angina pectoris: Secondary | ICD-10-CM

## 2015-09-06 DIAGNOSIS — E785 Hyperlipidemia, unspecified: Secondary | ICD-10-CM

## 2015-09-06 DIAGNOSIS — F319 Bipolar disorder, unspecified: Secondary | ICD-10-CM

## 2015-09-06 DIAGNOSIS — B20 Human immunodeficiency virus [HIV] disease: Secondary | ICD-10-CM

## 2015-09-06 NOTE — Progress Notes (Signed)
RFV: follow up for hiv care Subjective:    Patient ID: [name] Smith, male    DOB: 09/03/1960, 55 y.o.   MRN: [id number]  HPI  [age]yo M with HIV disease, CAD s/p CABG, COPD, depression who has been taking his meds pretty regularly. He states that he is keeping up with his doctors visit. Recently acquired medicaid which helps, previously having financial difficulty acquiring his meds, with aid of patient assistance. No recent worsening of his health problems. Allergies  Allergen Reactions  . Azithromycin Rash   Current Outpatient Prescriptions on File Prior to Visit  Medication Sig Dispense Refill  . albuterol (PROVENTIL HFA;VENTOLIN HFA) 108 (90 Base) MCG/ACT inhaler Inhale 2 puffs into the lungs every 6 (six) hours as needed for wheezing or shortness of breath. 1 Inhaler 12  . ARIPiprazole (ABILIFY) 15 MG tablet Take 15 mg by mouth daily.    Marland Kitchen. aspirin 325 MG EC tablet Take 325 mg by mouth daily.      Marland Kitchen. buPROPion (WELLBUTRIN) 100 MG tablet Take 1 tablet (100 mg total) by mouth daily. 30 tablet 3  . clopidogrel (PLAVIX) 75 MG tablet Take 1 tablet (75 mg total) by mouth daily. 30 tablet 0  . elvitegravir-cobicistat-emtricitabine-tenofovir (STRIBILD) 150-150-200-300 MG TABS tablet Take 1 tablet by mouth daily with breakfast. 30 tablet 5  . ibuprofen (ADVIL,MOTRIN) 600 MG tablet Take 1 tablet (600 mg total) by mouth every 6 (six) hours as needed for pain. 30 tablet 0  . metoprolol succinate (TOPROL-XL) 50 MG 24 hr tablet TAKE 1 TABLET BY MOUTH EVERY DAY 90 tablet 0  . nitroGLYCERIN (NITROSTAT) 0.4 MG SL tablet Place 1 tablet (0.4 mg total) under the tongue every 5 (five) minutes as needed. For chest pain -- may repeat times three 25 tablet 11  . rosuvastatin (CRESTOR) 20 MG tablet TAKE 1 TABLET (20 MG TOTAL) BY MOUTH DAILY. 30 tablet 5  . sildenafil (VIAGRA) 50 MG tablet Take 50 mg by mouth daily as needed for erectile dysfunction.    Marland Kitchen. tiotropium (SPIRIVA HANDIHALER) 18 MCG inhalation capsule  Place 1 capsule (18 mcg total) into inhaler and inhale daily. 30 capsule 12  . traMADol (ULTRAM) 50 MG tablet Take 50 mg by mouth every 6 (six) hours as needed for moderate pain or severe pain.    Marland Kitchen. triamcinolone cream (KENALOG) 0.1 % Apply topically 2 (two) times daily. 30 g 1  . valACYclovir (VALTREX) 1000 MG tablet Take 1 tablet (1,000 mg total) by mouth 3 (three) times daily. 21 tablet 1   No current facility-administered medications on file prior to visit.   Active Ambulatory Problems    Diagnosis Date Noted  . HIV INFECTION 03/20/2006  . HYPERCHOLESTEROLEMIA 11/03/2008  . ANXIETY 11/03/2008  . SMOKER 11/06/2008  . Essential hypertension 11/03/2008  . CORONARY ARTERY DISEASE 03/20/2006  . GERD 11/03/2008  . NEPHROLITHIASIS 11/03/2008  . COUGH 11/06/2008  . CORONARY ARTERY BYPASS GRAFT, THREE VESSEL, HX OF 03/20/2006  . Bruit 10/28/2010  . URI (upper respiratory infection) 04/29/2011  . Murmur 10/14/2011  . Encounter for disability assessment 06/08/2013  . Asthmatic bronchitis with acute exacerbation 01/03/2014   Resolved Ambulatory Problems    Diagnosis Date Noted  . No Resolved Ambulatory Problems   Past Medical History  Diagnosis Date  . Hypertension   . Need for prophylactic vaccination and inoculation against influenza    Social History  Substance Use Topics  . Smoking status: Former Smoker -- 1.00 packs/day for 36 years  Types: Cigarettes    Quit date: 12/12/2013  . Smokeless tobacco: Never Used  . Alcohol Use: 4.2 oz/week    7 Standard drinks or equivalent per week     Comment: wine     Review of Systems +depression, but no harm to self. 10 point ros is otherwise negative    Objective:   Physical Exam BP 153/82 mmHg  Pulse 90  Temp(Src) 98.5 F (36.9 C) (Oral)  Ht 5\' 8"  (1.727 m)  Wt 154 lb (69.854 kg)  BMI 23.42 kg/m2 Physical Exam  Constitutional: He is oriented to person, place, and time. He appears well-developed and well-nourished. No  distress.  HENT:  Mouth/Throat: Oropharynx is clear and moist. No oropharyngeal exudate.  Cardiovascular: Normal rate, regular rhythm and normal heart sounds. Exam reveals no gallop and no friction rub.  No murmur heard.  Pulmonary/Chest: Effort normal and breath sounds normal. No respiratory distress. He has no wheezes.  Abdominal: Soft. Bowel sounds are normal. He exhibits no distension. There is no tenderness.  Lymphadenopathy:  He has no cervical adenopathy.  Neurological: He is alert and oriented to person, place, and time.  Skin: Skin is warm and dry. No rash noted. No erythema.  Psychiatric: He has a normal mood and affect. His behavior is normal.   Lab Results  Component Value Date   CD4TABS 1220 08/24/2015   CD4TABS 1030 04/20/2015   CD4TABS 990 12/21/2014   Lab Results  Component Value Date   HIV1RNAQUANT <20 08/24/2015        Assessment & Plan:  hiv disease = will continue with his current regimen. Well controlled  CAD = continues on aspirin, lipid lower agent, metoprolol  HLD = continues on rosuvastatin  imms = will need flu vaccine in the fall  Depression/bipolar = continue on current regimen and follow up with mental health

## 2015-09-10 ENCOUNTER — Other Ambulatory Visit: Payer: Self-pay | Admitting: *Deleted

## 2015-09-10 ENCOUNTER — Telehealth: Payer: Self-pay | Admitting: *Deleted

## 2015-09-10 DIAGNOSIS — I10 Essential (primary) hypertension: Secondary | ICD-10-CM

## 2015-09-10 DIAGNOSIS — K644 Residual hemorrhoidal skin tags: Secondary | ICD-10-CM

## 2015-09-10 DIAGNOSIS — B029 Zoster without complications: Secondary | ICD-10-CM

## 2015-09-10 DIAGNOSIS — F32A Depression, unspecified: Secondary | ICD-10-CM

## 2015-09-10 DIAGNOSIS — B20 Human immunodeficiency virus [HIV] disease: Secondary | ICD-10-CM

## 2015-09-10 DIAGNOSIS — E785 Hyperlipidemia, unspecified: Secondary | ICD-10-CM

## 2015-09-10 DIAGNOSIS — F329 Major depressive disorder, single episode, unspecified: Secondary | ICD-10-CM

## 2015-09-10 DIAGNOSIS — J441 Chronic obstructive pulmonary disease with (acute) exacerbation: Secondary | ICD-10-CM

## 2015-09-10 MED ORDER — VALACYCLOVIR HCL 1 G PO TABS: 1000.0000 mg | ORAL_TABLET | Freq: Three times a day (TID) | ORAL | Status: DC

## 2015-09-10 MED ORDER — ARIPIPRAZOLE 15 MG PO TABS: 15.0000 mg | ORAL_TABLET | Freq: Every day | ORAL | Status: DC

## 2015-09-10 MED ORDER — TRIAMCINOLONE ACETONIDE 0.1 % EX CREA: TOPICAL_CREAM | Freq: Two times a day (BID) | CUTANEOUS | Status: DC

## 2015-09-10 MED ORDER — ELVITEG-COBIC-EMTRICIT-TENOFDF 150-150-200-300 MG PO TABS: 1.0000 | ORAL_TABLET | Freq: Every day | ORAL | Status: DC

## 2015-09-10 MED ORDER — METOPROLOL SUCCINATE ER 50 MG PO TB24: 50.0000 mg | ORAL_TABLET | Freq: Every day | ORAL | Status: DC

## 2015-09-10 MED ORDER — ALBUTEROL SULFATE HFA 108 (90 BASE) MCG/ACT IN AERS: 2.0000 | INHALATION_SPRAY | Freq: Four times a day (QID) | RESPIRATORY_TRACT | Status: DC | PRN

## 2015-09-10 MED ORDER — ROSUVASTATIN CALCIUM 20 MG PO TABS: ORAL_TABLET | ORAL | Status: DC

## 2015-09-10 MED ORDER — TIOTROPIUM BROMIDE MONOHYDRATE 18 MCG IN CAPS: 18.0000 ug | ORAL_CAPSULE | Freq: Every day | RESPIRATORY_TRACT | Status: DC

## 2015-09-10 NOTE — Telephone Encounter (Signed)
Patient asked for refills of all medications. RN advised patient to contact cardiologist to ask for those prescriptions.  Patient unsure if he is still taking wellbutrin, prescription not renewed.  Patient asking for refill of ultram - please advise if ok to refill this, or if it should come from PCP/Pain management, as patient is now insured. Andree CossHowell, Momen Ham M, RN

## 2015-09-12 ENCOUNTER — Other Ambulatory Visit: Payer: Self-pay | Admitting: *Deleted

## 2015-09-12 DIAGNOSIS — G894 Chronic pain syndrome: Secondary | ICD-10-CM

## 2015-09-12 MED ORDER — TRAMADOL HCL 50 MG PO TABS
50.0000 mg | ORAL_TABLET | Freq: Four times a day (QID) | ORAL | Status: DC | PRN
Start: 2015-09-12 — End: 2016-04-16

## 2015-09-12 NOTE — Telephone Encounter (Signed)
Refills called in. Thank you

## 2015-09-12 NOTE — Telephone Encounter (Signed)
We can refill his ultram

## 2015-09-17 ENCOUNTER — Other Ambulatory Visit: Payer: Self-pay | Admitting: *Deleted

## 2015-09-17 NOTE — Telephone Encounter (Signed)
PA needed for rosuvastatin rx completed and faxed to Medicaid.

## 2015-09-18 ENCOUNTER — Ambulatory Visit: Payer: No Typology Code available for payment source

## 2015-09-25 ENCOUNTER — Telehealth: Payer: Self-pay | Admitting: *Deleted

## 2015-09-25 ENCOUNTER — Ambulatory Visit: Payer: No Typology Code available for payment source

## 2015-09-25 NOTE — Telephone Encounter (Signed)
Patient called and advised he was denied Crestor by his insurance company advised him once we get the denial we will do a PA.

## 2015-10-08 ENCOUNTER — Ambulatory Visit: Payer: No Typology Code available for payment source

## 2015-10-08 ENCOUNTER — Other Ambulatory Visit: Payer: Self-pay | Admitting: Cardiovascular Disease

## 2015-10-08 MED ORDER — CLOPIDOGREL BISULFATE 75 MG PO TABS: 75.0000 mg | ORAL_TABLET | Freq: Every day | ORAL | 10 refills | Status: DC

## 2015-10-08 NOTE — BH Specialist Note (Deleted)
[name] Smith reports that he continues to struggle with symptoms of depression from time to time.  He said he is still taking Abilify, but isn't sure if it helps him or not. He has not had any manic episodes, which Abilify helps contain. He did not have any major complaints today, but was encouraged that he finally has a disability hearing in October. He said he tries to get out and around people some, but otherwise stays around his house.  Plan to meet again in 4 weeks. Franne Forts, LCSW

## 2015-10-17 ENCOUNTER — Encounter: Payer: Self-pay | Admitting: Internal Medicine

## 2015-10-22 NOTE — Progress Notes (Signed)
Patient ID: [name] Smith, male   DOB: 02-12-1961, 55 y.o.   MRN: [id number]   [name] Smith is seen today for F/U of dyspnea, CAD S/P CABG x2 With redo 10/11. EF has been mildly reduced in the 45% range. No clinical history of CHF. No arryhthmia and no indication for AICD. Still smoking. Clinical COPD. Recent sinus infection with course of cipro.  Marland Kitchen. No SSCP, dyspnea from smoking. NO palpitatons or edema. Lots of stress regarding work and possible disability application. HIV stable and F/U with Dr Maurice MarchLane.  Antiretroviral changed and zocor stopped now on crestor  Having issues with depression ? Bipolar Discussed the fact that he should not take viagra or chantix Counseled at length about smoking cessation   Echo 07/31/15  Moderate AS/moderate AR EF 50-55%  Carotids 08/03/15  with 1-39% bilateral disease   [name] Smith has been doing poorly lately  The consellation of his HIV, COPD, CAD with two previous bypass surgeries and bipolar disease has disabled him But he cannot get disability. Allergies worse recent prednisone taper . Has more dyspnea No fever  Has orange card now   Should be on disability for chronic health issues 2 previous CABG;s and now moderate  AS that has progressed    ROS: Denies fever, malais, weight loss, blurry vision, decreased visual acuity, cough, sputum, SOB, hemoptysis, pleuritic pain, palpitaitons, heartburn, abdominal pain, melena, lower extremity edema, claudication, or rash.  All other systems reviewed and negative  General: Affect agitated depression  Chronically ill  HEENT: normal Neck supple with no adenopathy JVP normal bilateral  bruits no thyromegaly Lungs clear with no wheezing and good diaphragmatic motion Heart:  S1/S2 AS  murmur, no rub, gallop or click PMI normal Abdomen: benighn, BS positve, no tenderness, no AAA no bruit.  No HSM or HJR Distal pulses intact with no bruits No edema Neuro non-focal Skin warm and dry No muscular weakness   Current  Outpatient Prescriptions  Medication Sig Dispense Refill  . albuterol (PROVENTIL HFA;VENTOLIN HFA) 108 (90 Base) MCG/ACT inhaler Inhale 2 puffs into the lungs every 6 (six) hours as needed for wheezing or shortness of breath. 1 Inhaler 5  . ARIPiprazole (ABILIFY) 15 MG tablet Take 1 tablet (15 mg total) by mouth daily. 30 tablet 5  . aspirin 325 MG EC tablet Take 325 mg by mouth daily.      Marland Kitchen. buPROPion (WELLBUTRIN) 100 MG tablet Take 1 tablet (100 mg total) by mouth daily. 30 tablet 3  . clopidogrel (PLAVIX) 75 MG tablet Take 1 tablet (75 mg total) by mouth daily. 30 tablet 10  . elvitegravir-cobicistat-emtricitabine-tenofovir (STRIBILD) 150-150-200-300 MG TABS tablet Take 1 tablet by mouth daily with breakfast. 30 tablet 5  . ibuprofen (ADVIL,MOTRIN) 600 MG tablet Take 1 tablet (600 mg total) by mouth every 6 (six) hours as needed for pain. 30 tablet 0  . metoprolol succinate (TOPROL-XL) 50 MG 24 hr tablet Take 1 tablet (50 mg total) by mouth daily. Take with or immediately following a meal. 90 tablet 1  . nitroGLYCERIN (NITROSTAT) 0.4 MG SL tablet Place 1 tablet (0.4 mg total) under the tongue every 5 (five) minutes as needed. For chest pain -- may repeat times three 25 tablet 11  . rosuvastatin (CRESTOR) 20 MG tablet TAKE 1 TABLET (20 MG TOTAL) BY MOUTH DAILY. 30 tablet 5  . sildenafil (VIAGRA) 50 MG tablet Take 50 mg by mouth daily as needed for erectile dysfunction.    Marland Kitchen. tiotropium (SPIRIVA HANDIHALER) 18 MCG inhalation capsule  Place 1 capsule (18 mcg total) into inhaler and inhale daily. 30 capsule 5  . traMADol (ULTRAM) 50 MG tablet Take 1 tablet (50 mg total) by mouth every 6 (six) hours as needed for moderate pain or severe pain. 30 tablet 2  . triamcinolone cream (KENALOG) 0.1 % Apply topically 2 (two) times daily. 30 g 1  . valACYclovir (VALTREX) 1000 MG tablet Take 1 tablet (1,000 mg total) by mouth 3 (three) times daily. 21 tablet 3   No current facility-administered medications for  this visit.     Allergies  Azithromycin  Electrocardiogram:   SR rate 102 LVH  03/25/13  05/02/14  SR rate 84 LVH no sig change  07/12/15 ST rate 100 LVH   Assessment and Plan CAD/CABG:  X 2 last 2011 stable no angina HIV:  On Stribild f/u ID better interaction with statin COPD:  Stopped  smoking has more dyspnea   Bipolar:  On Abilify mood stable still very stressed about not getting disability Chol:  Now on crestor as HIV med changed  Lab Results  Component Value Date   LDLCALC 69 12/21/2014  Murmur:  Echo 07/31/15 moderate AS meand gradient 20 mmHg moderate AR EF 50-55%  Bruit:  F/u duplex ASA duplex  08/03/15 had plaque no stenosis   F/U echo 01/2016  Would be candidate for TAVR possibly   Charlton HawsPeter Veneda Kirksey

## 2015-10-23 ENCOUNTER — Encounter: Payer: Self-pay | Admitting: Cardiovascular Disease

## 2015-10-23 ENCOUNTER — Ambulatory Visit (INDEPENDENT_AMBULATORY_CARE_PROVIDER_SITE_OTHER): Payer: No Typology Code available for payment source | Admitting: Cardiovascular Disease

## 2015-10-23 VITALS — BP 140/76 | HR 92 | Ht 68.0 in | Wt 156.8 lb

## 2015-10-23 DIAGNOSIS — I6523 Occlusion and stenosis of bilateral carotid arteries: Secondary | ICD-10-CM

## 2015-10-23 DIAGNOSIS — Z951 Presence of aortocoronary bypass graft: Secondary | ICD-10-CM

## 2015-10-23 NOTE — Patient Instructions (Addendum)
Medication Instructions:  Your physician recommends that you continue on your current medications as directed. Please refer to the Current Medication list given to you today.  Labwork: NONE  Testing/Procedures: Your physician has requested that you have an echocardiogram in November with office visit. Echocardiography is a painless test that uses sound waves to create images of your heart. It provides your doctor with information about the size and shape of your heart and how well your heart's chambers and valves are working. This procedure takes approximately one hour. There are no restrictions for this procedure.    Follow-Up: Your physician wants you to follow-up in: November with Dr. Eden EmmsNishan. You will receive a reminder letter in the mail two months in advance. If you don't receive a letter, please call our office to schedule the follow-up appointment.   If you need a refill on your cardiac medications before your next appointment, please call your pharmacy.

## 2015-11-05 ENCOUNTER — Ambulatory Visit: Payer: Medicaid Other

## 2015-11-05 DIAGNOSIS — F3160 Bipolar disorder, current episode mixed, unspecified: Secondary | ICD-10-CM

## 2015-11-05 NOTE — BH Specialist Note (Signed)
[name] Smith was in a pleasant mood today, reporting that he is beginning to get nervous about his upcoming disability hearing in October. He said he has an appointment tomorrow with his attorney. He asked me to look over his paperwork and the top letter was from his cardiologist saying he should not work.  I told him this should be a "no brainer" and that I hoped that it works out for him. He said he tries to stay busy and I suggested he distract himself over the next few weeks so that he doesn't sit around and worry about something he has no control over.  Plan to meet again in one month. Franne FortsKenny Britni Driscoll, LCSW

## 2015-11-07 ENCOUNTER — Telehealth: Payer: Self-pay | Admitting: *Deleted

## 2015-11-07 ENCOUNTER — Encounter: Payer: Self-pay | Admitting: *Deleted

## 2015-11-07 NOTE — Telephone Encounter (Signed)
Patient called triage, left message asking for call back.  Patient needs dates for when medications were first prescribed. His disability attorney has submitted signed requests for records, but has not yet received them ([facility], placed in [facility] request area). Patient would like to get started on the information himself. As [facility] computer system, RN advised patient I would not be able to provide accurate information; gave patient the number to Medical Records for assistance/advice. Patient verbalized understanding, agreement. Andree CossHowell, Timmy Cleverly M, RN

## 2015-11-08 NOTE — Telephone Encounter (Signed)
Unfortunately his attorney is asking for a specific start date for each/every medication he has been prescribed by [facility] physicians.

## 2015-11-08 NOTE — Telephone Encounter (Signed)
Our records in the [facility] only go back to 2008. You can write a letter on my behalf that he had started before then (maybe ask mr Frosch since he probably knows, or at least what year he was diagnosed).

## 2015-12-03 ENCOUNTER — Ambulatory Visit: Payer: Medicaid Other

## 2015-12-03 DIAGNOSIS — F3160 Bipolar disorder, current episode mixed, unspecified: Secondary | ICD-10-CM

## 2015-12-03 NOTE — BH Specialist Note (Signed)
[name] reports that he continues to worry about this disability claim and said he will be glad when it is over. He said he has been stressing about it but realizes that he has no control over their decision, so he needs to just let whatever happens happen. I agreed with him on this. His mood was fairly good today, despite this worry and pressure. Plan to meet again in one month. Franne FortsKenny Verta Riedlinger, LCSW

## 2015-12-10 ENCOUNTER — Telehealth: Payer: Self-pay | Admitting: Cardiovascular Disease

## 2015-12-10 NOTE — Telephone Encounter (Signed)
I do not know anything about records being sent to Insurance company-This however will get completed  By our copy service [employer].

## 2015-12-10 NOTE — Telephone Encounter (Signed)
Patient has mailed a copy of his disablity papers to Dr. Eden EmmsNishan. Patient is stating that his insurance is needing additional information. Patient is bringing a copy of his insurance company request. Will forward to Medical Records to see if they have any knowledge of this patient's paper work.

## 2015-12-10 NOTE — Telephone Encounter (Signed)
New message      Calling to talk to the nurse about ins papers Dr Eden EmmsNishan is completing

## 2015-12-11 NOTE — Telephone Encounter (Signed)
Dr. Eden EmmsNishan filled out patient's forms. Patient will pickup today.

## 2015-12-25 ENCOUNTER — Ambulatory Visit: Payer: No Typology Code available for payment source

## 2015-12-31 ENCOUNTER — Ambulatory Visit: Payer: Medicaid Other

## 2016-01-07 ENCOUNTER — Ambulatory Visit (INDEPENDENT_AMBULATORY_CARE_PROVIDER_SITE_OTHER): Payer: Medicaid Other | Admitting: *Deleted

## 2016-01-07 DIAGNOSIS — Z23 Encounter for immunization: Secondary | ICD-10-CM

## 2016-01-09 DIAGNOSIS — Z23 Encounter for immunization: Secondary | ICD-10-CM | POA: Diagnosis not present

## 2016-01-21 ENCOUNTER — Other Ambulatory Visit (HOSPITAL_COMMUNITY): Payer: Medicaid Other

## 2016-01-25 NOTE — Progress Notes (Signed)
Patient ID: [name] DiamondMichael K Smith, male   DOB: 02-09-1961, 55 y.o.   MRN: [id number]   [name] DanceKeith is seen today for F/U of dyspnea, CAD S/P CABG x2 With redo 10/11. EF has been mildly reduced in the 45% range. No clinical history of CHF. No arryhthmia and no indication for AICD. Still smoking. Clinical COPD. Recent sinus infection with course of cipro.  Marland Kitchen. No SSCP, dyspnea from smoking. NO palpitatons or edema. Lots of stress regarding work and possible disability application. HIV stable and F/U with Dr Maurice MarchLane.  Antiretroviral changed and zocor stopped now on crestor  Having issues with depression ? Bipolar Discussed the fact that he should not take viagra or chantix Counseled at length about smoking cessation   Echo 07/31/15  Moderate AS/moderate AR EF 50-55%  Carotids 08/03/15  with 1-39% bilateral disease   [name] Smith has been doing poorly lately  The consellation of his HIV, COPD, CAD with two previous bypass surgeries and bipolar disease has disabled him But he cannot get disability. Allergies worse recent prednisone taper . Has more dyspnea No fever  Has orange card now   Should be on disability for chronic health issues 2 previous CABG;s and now moderate  AS that has progressed    ROS: Denies fever, malais, weight loss, blurry vision, decreased visual acuity, cough, sputum, SOB, hemoptysis, pleuritic pain, palpitaitons, heartburn, abdominal pain, melena, lower extremity edema, claudication, or rash.  All other systems reviewed and negative  General: Affect agitated depression  Chronically ill  HEENT: normal Neck supple with no adenopathy JVP normal bilateral  bruits no thyromegaly Lungs clear with no wheezing and good diaphragmatic motion Heart:  S1/S2 AS  murmur, no rub, gallop or click PMI normal Abdomen: benighn, BS positve, no tenderness, no AAA no bruit.  No HSM or HJR Distal pulses intact with no bruits No edema Neuro non-focal Skin warm and dry No muscular weakness   Current  Outpatient Prescriptions  Medication Sig Dispense Refill  . albuterol (PROVENTIL HFA;VENTOLIN HFA) 108 (90 Base) MCG/ACT inhaler Inhale 2 puffs into the lungs every 6 (six) hours as needed for wheezing or shortness of breath. 1 Inhaler 5  . ARIPiprazole (ABILIFY) 15 MG tablet Take 1 tablet (15 mg total) by mouth daily. 30 tablet 5  . aspirin 325 MG EC tablet Take 325 mg by mouth daily.      Marland Kitchen. buPROPion (WELLBUTRIN) 100 MG tablet Take 1 tablet (100 mg total) by mouth daily. 30 tablet 3  . clopidogrel (PLAVIX) 75 MG tablet Take 1 tablet (75 mg total) by mouth daily. 30 tablet 10  . elvitegravir-cobicistat-emtricitabine-tenofovir (STRIBILD) 150-150-200-300 MG TABS tablet Take 1 tablet by mouth daily with breakfast. 30 tablet 5  . ibuprofen (ADVIL,MOTRIN) 600 MG tablet Take 1 tablet (600 mg total) by mouth every 6 (six) hours as needed for pain. 30 tablet 0  . metoprolol succinate (TOPROL-XL) 50 MG 24 hr tablet Take 1 tablet (50 mg total) by mouth daily. Take with or immediately following a meal. 90 tablet 1  . nitroGLYCERIN (NITROSTAT) 0.4 MG SL tablet Place 1 tablet (0.4 mg total) under the tongue every 5 (five) minutes as needed. For chest pain -- may repeat times three 25 tablet 11  . rosuvastatin (CRESTOR) 20 MG tablet TAKE 1 TABLET (20 MG TOTAL) BY MOUTH DAILY. 30 tablet 5  . sildenafil (VIAGRA) 50 MG tablet Take 50 mg by mouth daily as needed for erectile dysfunction.    Marland Kitchen. tiotropium (SPIRIVA HANDIHALER) 18 MCG inhalation capsule  Place 1 capsule (18 mcg total) into inhaler and inhale daily. 30 capsule 5  . traMADol (ULTRAM) 50 MG tablet Take 1 tablet (50 mg total) by mouth every 6 (six) hours as needed for moderate pain or severe pain. 30 tablet 2  . triamcinolone cream (KENALOG) 0.1 % Apply topically 2 (two) times daily. 30 g 1  . valACYclovir (VALTREX) 1000 MG tablet Take 1 tablet (1,000 mg total) by mouth 3 (three) times daily. 21 tablet 3   No current facility-administered medications for  this visit.     Allergies  Azithromycin  Electrocardiogram:   SR rate 102 LVH  03/25/13  05/02/14  SR rate 84 LVH no sig change  07/12/15 ST rate 100 LVH   Assessment and Plan CAD/CABG:  X 2 last 2011 stable no angina HIV:  On Stribild f/u ID better interaction with statin COPD:  Stopped  smoking has more dyspnea   Bipolar:  On Abilify mood stable still very stressed about not getting disability Chol:  Now on crestor as HIV med changed  Lab Results  Component Value Date   LDLCALC 69 12/21/2014  Murmur:  Echo 07/31/15 moderate AS meand gradient 20 mmHg moderate AR EF 50-55%  Bruit:  F/u duplex ASA duplex  08/03/15 had plaque no stenosis   F/U echo May 2018   Would be candidate for TAVR possibly  Disability forms filled out again   [employer]Peter Kensy Blizard

## 2016-01-28 ENCOUNTER — Ambulatory Visit (INDEPENDENT_AMBULATORY_CARE_PROVIDER_SITE_OTHER): Payer: Medicaid Other | Admitting: Cardiovascular Disease

## 2016-01-28 VITALS — BP 146/80 | HR 84 | Ht 69.0 in | Wt 157.0 lb

## 2016-01-28 DIAGNOSIS — I350 Nonrheumatic aortic (valve) stenosis: Secondary | ICD-10-CM | POA: Diagnosis not present

## 2016-01-28 NOTE — Patient Instructions (Addendum)
Medication Instructions:   Your physician recommends that you continue on your current medications as directed. Please refer to the Current Medication list given to you today.    If you need a refill on your cardiac medications before your next appointment, please call your pharmacy.  Labwork: NONE ORDERED  TODAY    Testing/Procedures: IN MAY Your physician has requested that you have an echocardiogram. Echocardiography is a painless test that uses sound waves to create images of your heart. It provides your doctor with information about the size and shape of your heart and how well your heart's chambers and valves are working. This procedure takes approximately one hour. There are no restrictions for this procedure. ORDER IN EPIC WAS PLACED ON 10/23/2015..      Follow-Up:  IN MAY PER NISHAN    Any Other Special Instructions Will Be Listed Below (If Applicable).

## 2016-02-26 ENCOUNTER — Ambulatory Visit (INDEPENDENT_AMBULATORY_CARE_PROVIDER_SITE_OTHER): Payer: Medicaid Other | Admitting: Internal Medicine

## 2016-02-26 ENCOUNTER — Encounter: Payer: Self-pay | Admitting: Internal Medicine

## 2016-02-26 VITALS — BP 160/75 | HR 97 | Temp 98.3°F | Ht 68.0 in | Wt 157.0 lb

## 2016-02-26 DIAGNOSIS — J441 Chronic obstructive pulmonary disease with (acute) exacerbation: Secondary | ICD-10-CM

## 2016-02-26 DIAGNOSIS — B20 Human immunodeficiency virus [HIV] disease: Secondary | ICD-10-CM | POA: Diagnosis present

## 2016-02-26 LAB — LIPID PANEL
Cholesterol: 194 mg/dL (ref <200)
HDL: 46 mg/dL (ref >40)
LDL Cholesterol: 101 mg/dL — ABNORMAL HIGH (ref <100)
Total CHOL/HDL Ratio: 4.2 Ratio (ref <5.0)
Triglycerides: 236 mg/dL — ABNORMAL HIGH (ref <150)
VLDL: 47 mg/dL — ABNORMAL HIGH (ref <30)

## 2016-02-26 LAB — CBC WITH DIFFERENTIAL/PLATELET
Basophils Absolute: 84 cells/uL (ref 0–200)
Basophils Relative: 1 %
Eosinophils Absolute: 84 cells/uL (ref 15–500)
Eosinophils Relative: 1 %
HCT: 50.3 % — ABNORMAL HIGH (ref 38.5–50.0)
Hemoglobin: 17 g/dL (ref 13.2–17.1)
Lymphocytes Relative: 33 %
Lymphs Abs: 2772 cells/uL (ref 850–3900)
MCH: 34.1 pg — ABNORMAL HIGH (ref 27.0–33.0)
MCHC: 33.8 g/dL (ref 32.0–36.0)
MCV: 101 fL — ABNORMAL HIGH (ref 80.0–100.0)
MPV: 10 fL (ref 7.5–12.5)
Monocytes Absolute: 588 cells/uL (ref 200–950)
Monocytes Relative: 7 %
Neutro Abs: 4872 cells/uL (ref 1500–7800)
Neutrophils Relative %: 58 %
Platelets: 208 10*3/uL (ref 140–400)
RBC: 4.98 MIL/uL (ref 4.20–5.80)
RDW: 13 % (ref 11.0–15.0)
WBC: 8.4 10*3/uL (ref 3.8–10.8)

## 2016-02-26 LAB — COMPLETE METABOLIC PANEL WITH GFR
ALT: 19 U/L (ref 9–46)
AST: 20 U/L (ref 10–35)
Albumin: 4.4 g/dL (ref 3.6–5.1)
Alkaline Phosphatase: 45 U/L (ref 40–115)
BUN: 17 mg/dL (ref 7–25)
CO2: 25 mmol/L (ref 20–31)
Calcium: 9.4 mg/dL (ref 8.6–10.3)
Chloride: 102 mmol/L (ref 98–110)
Creat: 1.1 mg/dL (ref 0.70–1.33)
GFR, Est African American: 88 mL/min (ref >=60)
GFR, Est Non African American: 76 mL/min (ref >=60)
Glucose, Bld: 85 mg/dL (ref 65–99)
Potassium: 4.6 mmol/L (ref 3.5–5.3)
Sodium: 137 mmol/L (ref 135–146)
Total Bilirubin: 1 mg/dL (ref 0.2–1.2)
Total Protein: 7.3 g/dL (ref 6.1–8.1)

## 2016-02-26 MED ORDER — MENINGOCOCCAL A C Y&W-135 OLIG IM SOLR
0.5000 mL | Freq: Once | INTRAMUSCULAR | Status: AC
  Administered 2016-02-26: 0.5 mL via INTRAMUSCULAR

## 2016-02-26 MED ORDER — ALBUTEROL SULFATE HFA 108 (90 BASE) MCG/ACT IN AERS: 2.0000 | INHALATION_SPRAY | Freq: Four times a day (QID) | RESPIRATORY_TRACT | 5 refills | Status: DC | PRN

## 2016-02-26 MED ORDER — ELVITEG-COBIC-EMTRICIT-TENOFDF 150-150-200-300 MG PO TABS: 1.0000 | ORAL_TABLET | Freq: Every day | ORAL | 11 refills | Status: DC

## 2016-02-26 NOTE — Progress Notes (Signed)
Rfv: follow up for hiv disease  Patient ID: [name] Smith, male   DOB: 03-Aug-1960, 55 y.o.   MRN: [id number]  HPI  55yo M with hiv disease, CD 4 count of 1220/VL<20 on stribild.. Ran out and started back on atripla. He states he was able to get re-qualified for medicaid.no recent illnesses. Doing well overall. No recent flares of chronic illnesses. He did not take his morning medicaitons  Outpatient Encounter Prescriptions as of 02/26/2016  Medication Sig  . albuterol (PROVENTIL HFA;VENTOLIN HFA) 108 (90 Base) MCG/ACT inhaler Inhale 2 puffs into the lungs every 6 (six) hours as needed for wheezing or shortness of breath.  . ARIPiprazole (ABILIFY) 15 MG tablet Take 1 tablet (15 mg total) by mouth daily.  Marland Kitchen. aspirin 325 MG EC tablet Take 325 mg by mouth daily.    Marland Kitchen. buPROPion (WELLBUTRIN) 100 MG tablet Take 1 tablet (100 mg total) by mouth daily.  . clopidogrel (PLAVIX) 75 MG tablet Take 1 tablet (75 mg total) by mouth daily.  Marland Kitchen. elvitegravir-cobicistat-emtricitabine-tenofovir (STRIBILD) 150-150-200-300 MG TABS tablet Take 1 tablet by mouth daily with breakfast.  . ibuprofen (ADVIL,MOTRIN) 600 MG tablet Take 1 tablet (600 mg total) by mouth every 6 (six) hours as needed for pain.  . metoprolol succinate (TOPROL-XL) 50 MG 24 hr tablet Take 1 tablet (50 mg total) by mouth daily. Take with or immediately following a meal.  . nitroGLYCERIN (NITROSTAT) 0.4 MG SL tablet Place 1 tablet (0.4 mg total) under the tongue every 5 (five) minutes as needed. For chest pain -- may repeat times three  . rosuvastatin (CRESTOR) 20 MG tablet TAKE 1 TABLET (20 MG TOTAL) BY MOUTH DAILY.  . sildenafil (VIAGRA) 50 MG tablet Take 50 mg by mouth daily as needed for erectile dysfunction.  Marland Kitchen. tiotropium (SPIRIVA HANDIHALER) 18 MCG inhalation capsule Place 1 capsule (18 mcg total) into inhaler and inhale daily.  . traMADol (ULTRAM) 50 MG tablet Take 1 tablet (50 mg total) by mouth every 6 (six) hours as needed for  moderate pain or severe pain.  Marland Kitchen. triamcinolone cream (KENALOG) 0.1 % Apply topically 2 (two) times daily.  . valACYclovir (VALTREX) 1000 MG tablet Take 1 tablet (1,000 mg total) by mouth 3 (three) times daily.   No facility-administered encounter medications on file as of 02/26/2016.      Patient Active Problem List   Diagnosis Date Noted  . Asthmatic bronchitis with acute exacerbation 01/03/2014  . Encounter for disability assessment 06/08/2013  . Murmur 10/14/2011  . URI (upper respiratory infection) 04/29/2011  . Bruit 10/28/2010  . SMOKER 11/06/2008  . COUGH 11/06/2008  . HYPERCHOLESTEROLEMIA 11/03/2008  . ANXIETY 11/03/2008  . Essential hypertension 11/03/2008  . GERD 11/03/2008  . NEPHROLITHIASIS 11/03/2008  . HIV INFECTION 03/20/2006  . CORONARY ARTERY DISEASE 03/20/2006  . CORONARY ARTERY BYPASS GRAFT, THREE VESSEL, HX OF 03/20/2006     Health Maintenance Due  Topic Date Due  . TETANUS/TDAP  03/04/1980  . COLONOSCOPY  03/05/2011     Review of Systems 10 point ros is negative Physical Exam   BP (!) 160/75   Pulse 97   Temp 98.3 F (36.8 C) (Oral)   Ht 5\' 8"  (1.727 m)   Wt 157 lb (71.2 kg)   BMI 23.87 kg/m  Physical Exam  Constitutional: He is oriented to person, place, and time. He appears well-developed and well-nourished. No distress.  HENT:  Mouth/Throat: Oropharynx is clear and moist. No oropharyngeal exudate.  Cardiovascular: Normal rate, regular  rhythm and normal heart sounds. Exam reveals no gallop and no friction rub.  No murmur heard.  Pulmonary/Chest: Effort normal and breath sounds normal. No respiratory distress. He has no wheezes.  Abdominal: Soft. Bowel sounds are normal. He exhibits no distension. There is no tenderness.  Lymphadenopathy:  He has no cervical adenopathy.  Neurological: He is alert and oriented to person, place, and time.  Skin: Skin is warm and dry. No rash noted. No erythema.  Psychiatric: He has a normal mood and affect.  His behavior is normal.    Lab Results  Component Value Date   CD4TCELL 44 08/24/2015   Lab Results  Component Value Date   CD4TABS 1,220 08/24/2015   CD4TABS 1,030 04/20/2015   CD4TABS 990 12/21/2014   Lab Results  Component Value Date   HIV1RNAQUANT <20 08/24/2015   Lab Results  Component Value Date   HEPBSAB Yes 05/04/2006   No results found for: RPR  CBC Lab Results  Component Value Date   WBC 7.2 04/20/2015   RBC 4.67 04/20/2015   HGB 16.3 04/20/2015   HCT 46.9 04/20/2015   PLT 216 04/20/2015   MCV 100.4 (H) 04/20/2015   MCH 34.9 (H) 04/20/2015   MCHC 34.8 04/20/2015   RDW 12.9 04/20/2015   LYMPHSABS 2.3 04/20/2015   MONOABS 0.4 04/20/2015   EOSABS 0.1 04/20/2015   BASOSABS 0.0 04/20/2015   BMET Lab Results  Component Value Date   NA 136 04/20/2015   K 5.2 04/20/2015   CL 102 04/20/2015   CO2 27 04/20/2015   GLUCOSE 98 04/20/2015   BUN 13 04/20/2015   CREATININE 1.12 04/20/2015   CALCIUM 9.8 04/20/2015   GFRNONAA 74 04/20/2015   GFRAA 86 04/20/2015     Assessment and Plan  hiv disease = labs and will give him refills on stribild htn = has not taken his meds yet this am Copd = gave refill on albuterol Health maintenance = meningococcal booster. Flu already

## 2016-02-27 ENCOUNTER — Other Ambulatory Visit: Payer: Self-pay | Admitting: Internal Medicine

## 2016-02-27 LAB — T-HELPER CELL (CD4) - (RCID CLINIC ONLY)
CD4 % Helper T Cell: 41 % (ref 33–55)
CD4 T Cell Abs: 1270 /uL (ref 400–2700)

## 2016-02-27 LAB — RPR

## 2016-02-28 LAB — HIV-1 RNA QUANT-NO REFLEX-BLD
HIV 1 RNA Quant: <20 copies/mL (ref <20)
HIV-1 RNA Quant, Log: <1.3 Log copies/mL (ref <1.30)

## 2016-03-17 ENCOUNTER — Ambulatory Visit: Payer: Medicaid Other

## 2016-03-17 ENCOUNTER — Telehealth: Payer: Self-pay | Admitting: Cardiovascular Disease

## 2016-03-17 DIAGNOSIS — F3160 Bipolar disorder, current episode mixed, unspecified: Secondary | ICD-10-CM

## 2016-03-17 NOTE — Telephone Encounter (Signed)
Mr. [name] Smith is returning a call .  Thanks

## 2016-03-17 NOTE — BH Specialist Note (Signed)
[name] Smith was in a pleasant mood today, reporting that he finally got a hearing with Disability back in October, but is still waiting to hear from them. He also informed me he now has Medicaid, so I had him sign some paperwork for [facility] for billing. He continues to complain about anxiety and mood swings from time to time. Plan to meet in 6 weeks. Franne FortsKenny Brayton Baumgartner, LCSW

## 2016-03-17 NOTE — Telephone Encounter (Signed)
Called patient to let him know his forms were filled out and he can pick them up at the front desk tomorrow.

## 2016-03-17 NOTE — Telephone Encounter (Signed)
Patient called to make sure Dr. Eden EmmsNishan received paper work that he had sent. Dr. Eden EmmsNishan has paper work for patient. Dr. Eden EmmsNishan will fill out and I will call patient when it is ready to be picked up.

## 2016-04-16 ENCOUNTER — Other Ambulatory Visit: Payer: Self-pay | Admitting: Internal Medicine

## 2016-04-16 ENCOUNTER — Ambulatory Visit: Payer: Self-pay

## 2016-04-16 ENCOUNTER — Ambulatory Visit (INDEPENDENT_AMBULATORY_CARE_PROVIDER_SITE_OTHER): Payer: Medicare Other | Admitting: Physician Assistant

## 2016-04-16 ENCOUNTER — Encounter: Payer: Self-pay | Admitting: Physician Assistant

## 2016-04-16 ENCOUNTER — Ambulatory Visit (INDEPENDENT_AMBULATORY_CARE_PROVIDER_SITE_OTHER): Payer: Medicare Other

## 2016-04-16 VITALS — BP 130/68 | HR 79 | Temp 98.0°F | Resp 16 | Ht 69.0 in | Wt 159.0 lb

## 2016-04-16 DIAGNOSIS — R1013 Epigastric pain: Secondary | ICD-10-CM

## 2016-04-16 DIAGNOSIS — R109 Unspecified abdominal pain: Secondary | ICD-10-CM | POA: Diagnosis not present

## 2016-04-16 DIAGNOSIS — G894 Chronic pain syndrome: Secondary | ICD-10-CM

## 2016-04-16 MED ORDER — RANITIDINE HCL 150 MG PO TABS: 150.0000 mg | ORAL_TABLET | Freq: Two times a day (BID) | ORAL | 0 refills | Status: DC

## 2016-04-16 MED ORDER — SUCRALFATE 1 GM/10ML PO SUSP: 1.0000 g | Freq: Three times a day (TID) | ORAL | 0 refills | Status: DC

## 2016-04-16 NOTE — Patient Instructions (Addendum)
  Start medications - bland food and water only   IF you received an x-ray today, you will receive an invoice from [facility]. Please contact [facility] at [phone] with questions or concerns regarding your invoice.   IF you received labwork today, you will receive an invoice from [facility]. Please contact [facility] at [phone] with questions or concerns regarding your invoice.   Our billing staff will not be able to assist you with questions regarding bills from these companies.  You will be contacted with the lab results as soon as they are available. The fastest way to get your results is to activate your My Chart account. Instructions are located on the last page of this paperwork. If you have not heard from us regarding the results in 2 weeks, please contact this office.

## 2016-04-16 NOTE — Progress Notes (Signed)
[name] Smith  MRN: [id number] DOB: 05-13-1960  Subjective:  Pt presents to clinic with 2 day h/o upper/right abd pain after he ate a serving of fried chicken wings.  He got intense pain and nausea afterwards that has continued to happen every time he eats something - drinking he gets a little discomfort (coffee made the pain worse than water) but he has really stopped eating much due to the pain. He has never had  Pain like this before.  He has had no abd surgery.  He has had no fevers.  He has h/o kidney stones but this is very different pain from a completely different location.  He oly had nausea right after he ate the chicken wings that triggers this pain.  He has had no constipation or diarrhea. He does not feel sick.  He had vomiting the 1st night afte rhe ate the chicken wings.  Zantac helped with the pain slightly - he has used Zantac 75m.  He uses advil a few times a week but only 1 pill at a time and drinks a few times a week but not much at a time.  HIV Viral load - undetectable CD4 - good  Review of Systems  Constitutional: Negative for chills and unexpected weight change.  Gastrointestinal: Positive for nausea and vomiting. Negative for diarrhea.    Patient Active Problem List   Diagnosis Date Noted  . Asthmatic bronchitis with acute exacerbation 01/03/2014  . Encounter for disability assessment 06/08/2013  . Murmur 10/14/2011  . URI (upper respiratory infection) 04/29/2011  . Bruit 10/28/2010  . SMOKER 11/06/2008  . COUGH 11/06/2008  . HYPERCHOLESTEROLEMIA 11/03/2008  . ANXIETY 11/03/2008  . Essential hypertension 11/03/2008  . GERD 11/03/2008  . NEPHROLITHIASIS 11/03/2008  . HIV INFECTION 03/20/2006  . CORONARY ARTERY DISEASE 03/20/2006  . CORONARY ARTERY BYPASS GRAFT, THREE VESSEL, HX OF 03/20/2006    Current Outpatient Prescriptions on File Prior to Visit  Medication Sig Dispense Refill  . albuterol (PROVENTIL HFA;VENTOLIN HFA) 108 (90 Base) MCG/ACT  inhaler Inhale 2 puffs into the lungs every 6 (six) hours as needed for wheezing or shortness of breath. 1 Inhaler 5  . ARIPiprazole (ABILIFY) 15 MG tablet Take 1 tablet (15 mg total) by mouth daily. 30 tablet 5  . aspirin 325 MG EC tablet Take 325 mg by mouth daily.      .Marland KitchenbuPROPion (WELLBUTRIN) 100 MG tablet Take 1 tablet (100 mg total) by mouth daily. 30 tablet 3  . clopidogrel (PLAVIX) 75 MG tablet Take 1 tablet (75 mg total) by mouth daily. 30 tablet 10  . elvitegravir-cobicistat-emtricitabine-tenofovir (STRIBILD) 150-150-200-300 MG TABS tablet Take 1 tablet by mouth daily with breakfast. 30 tablet 11  . fluticasone (FLONASE) 50 MCG/ACT nasal spray INHALE 2 PUFFS PER NOSTRIL AT BEDTIME 16 g 6  . ibuprofen (ADVIL,MOTRIN) 600 MG tablet Take 1 tablet (600 mg total) by mouth every 6 (six) hours as needed for pain. 30 tablet 0  . metoprolol succinate (TOPROL-XL) 50 MG 24 hr tablet Take 1 tablet (50 mg total) by mouth daily. Take with or immediately following a meal. 90 tablet 1  . nitroGLYCERIN (NITROSTAT) 0.4 MG SL tablet Place 1 tablet (0.4 mg total) under the tongue every 5 (five) minutes as needed. For chest pain -- may repeat times three 25 tablet 11  . rosuvastatin (CRESTOR) 20 MG tablet TAKE 1 TABLET (20 MG TOTAL) BY MOUTH DAILY. 30 tablet 5  . sildenafil (VIAGRA) 50 MG tablet Take 50  mg by mouth daily as needed for erectile dysfunction.    Marland Kitchen tiotropium (SPIRIVA HANDIHALER) 18 MCG inhalation capsule Place 1 capsule (18 mcg total) into inhaler and inhale daily. 30 capsule 5  . traMADol (ULTRAM) 50 MG tablet Take 1 tablet (50 mg total) by mouth every 6 (six) hours as needed for moderate pain or severe pain. 30 tablet 2  . triamcinolone cream (KENALOG) 0.1 % Apply topically 2 (two) times daily. 30 g 1  . valACYclovir (VALTREX) 1000 MG tablet Take 1 tablet (1,000 mg total) by mouth 3 (three) times daily. 21 tablet 3   No current facility-administered medications on file prior to visit.      Allergies  Allergen Reactions  . Azithromycin Rash    Pt patients past, family and social history were reviewed and updated.   Objective:  BP 130/68 (BP Location: Right Arm, Patient Position: Sitting, Cuff Size: Normal)   Pulse 79   Temp 98 F (36.7 C) (Oral)   Resp 16   Ht 5' 9"  (1.753 m)   Wt 159 lb (72.1 kg)   SpO2 97%   BMI 23.48 kg/m   Physical Exam  Constitutional: He is oriented to person, place, and time and well-developed, well-nourished, and in no distress.  HENT:  Head: Normocephalic and atraumatic.  Right Ear: External ear normal.  Left Ear: External ear normal.  Eyes: Conjunctivae are normal.  Neck: Normal range of motion.  Cardiovascular: Normal rate, regular rhythm and normal heart sounds.   No murmur heard. Pulmonary/Chest: Effort normal and breath sounds normal. He has no wheezes.  Abdominal: Soft. Bowel sounds are normal. There is tenderness (right side middle of abdomin) in the right upper quadrant and epigastric area.  Neurological: He is alert and oriented to person, place, and time. Gait normal.  Skin: Skin is warm and dry.  Psychiatric: Mood, memory, affect and judgment normal.    Dg Abd Acute W/chest  Result Date: 04/16/2016 CLINICAL DATA:  Acute right-sided abdominal pain EXAM: DG ABDOMEN ACUTE W/ 1V CHEST COMPARISON:  CT abdomen and pelvis of 07/21/2006, and chest x-ray of 01/24/2004 FINDINGS: Linear scarring or atelectasis is noted at the right lung base. No pneumonia or effusion is seen. Mild cardiomegaly is stable. Median sternotomy sutures are noted from prior CABG. No acute bony abnormality is seen. Supine and erect views the abdomen show no bowel obstruction and no free air. There is a moderate amount of feces throughout colon. Small renal calculi cannot be excluded bilaterally. The calcification on the left may be renovascular in origin. Vague opacity at the in the right lower quadrant could represent a cluster of sutures or possibly  material within the bowel but clinical correlation is recommended. IMPRESSION: 1. Linear atelectasis or scarring at the right lung base. No active lung disease. Mild cardiomegaly. 2. Moderate amount of feces throughout the colon. 3. Cannot exclude small bilateral renal calculi. Electronically Signed   By: Ivar Drape M.D.   On: 04/16/2016 11:19    Assessment and Plan :  Epigastric pain - Plan: CBC with Differential/Platelet, CMP14+EGFR, DG Abd Acute W/Chest, sucralfate (CARAFATE) 1 GM/10ML suspension, H. pylori breath test, ranitidine (ZANTAC) 150 MG tablet - likely gastritis - due to abrupt onset - will treat with higher dose of Zantac and carafate while waiting on labs - he will contact me if his stools become dark brown/black.  Windell Hummingbird PA-C  [facility] 04/16/2016 11:55 AM

## 2016-04-17 ENCOUNTER — Telehealth: Payer: Self-pay | Admitting: Physician Assistant

## 2016-04-17 LAB — CBC WITH DIFFERENTIAL/PLATELET
Basophils Absolute: 0 10*3/uL (ref 0.0–0.2)
Basos: 0 %
EOS (ABSOLUTE): 0.2 10*3/uL (ref 0.0–0.4)
Eos: 2 %
Hematocrit: 50.8 % (ref 37.5–51.0)
Hemoglobin: 17.2 g/dL (ref 13.0–17.7)
Immature Grans (Abs): 0 10*3/uL (ref 0.0–0.1)
Immature Granulocytes: 0 %
Lymphocytes Absolute: 3.1 10*3/uL (ref 0.7–3.1)
Lymphs: 28 %
MCH: 34.4 pg — ABNORMAL HIGH (ref 26.6–33.0)
MCHC: 33.9 g/dL (ref 31.5–35.7)
MCV: 102 fL — ABNORMAL HIGH (ref 79–97)
Monocytes Absolute: 0.8 10*3/uL (ref 0.1–0.9)
Monocytes: 7 %
Neutrophils Absolute: 7.2 10*3/uL — ABNORMAL HIGH (ref 1.4–7.0)
Neutrophils: 63 %
Platelets: 194 10*3/uL (ref 150–379)
RBC: 5 x10E6/uL (ref 4.14–5.80)
RDW: 13.6 % (ref 12.3–15.4)
WBC: 11.3 10*3/uL — ABNORMAL HIGH (ref 3.4–10.8)

## 2016-04-17 LAB — CMP14+EGFR
ALT: 25 IU/L (ref 0–44)
AST: 40 IU/L (ref 0–40)
Albumin/Globulin Ratio: 1.5 (ref 1.2–2.2)
Albumin: 4.4 g/dL (ref 3.5–5.5)
Alkaline Phosphatase: 71 IU/L (ref 39–117)
BUN/Creatinine Ratio: 11 (ref 9–20)
BUN: 12 mg/dL (ref 6–24)
Bilirubin Total: 0.6 mg/dL (ref 0.0–1.2)
CO2: 21 mmol/L (ref 18–29)
Calcium: 9.7 mg/dL (ref 8.7–10.2)
Chloride: 97 mmol/L (ref 96–106)
Creatinine, Ser: 1.07 mg/dL (ref 0.76–1.27)
GFR calc Af Amer: 90 mL/min/{1.73_m2} (ref >59)
GFR calc non Af Amer: 78 mL/min/{1.73_m2} (ref >59)
Globulin, Total: 2.9 g/dL (ref 1.5–4.5)
Glucose: 63 mg/dL — ABNORMAL LOW (ref 65–99)
Potassium: 4.6 mmol/L (ref 3.5–5.2)
Sodium: 141 mmol/L (ref 134–144)
Total Protein: 7.3 g/dL (ref 6.0–8.5)

## 2016-04-17 NOTE — Telephone Encounter (Signed)
LMOM for patient to call me back. I want to clarify if he uses motrin and how often as well as ETOH usage.  I have his labs back and want to check up on him.

## 2016-04-18 NOTE — Telephone Encounter (Signed)
Ibuprofen use: "every once in a while" 3-4 times a week, 1 pill each time  EtOH use: a glass of wine or a couple of beers each afternoon or every other afternoon.  Reports that he is improving. No nausea. Still having a little bit of pain "every once in a while."

## 2016-04-18 NOTE — Telephone Encounter (Signed)
Left message to return call regarding ibuprofen and alcohol use.

## 2016-04-18 NOTE — Telephone Encounter (Signed)
THIS MESSAGE IS FOR SARAH: MR. Renwick RETURNED YOUR CALL THIS MORNING AT 8:15am. I TOLD HIM YOU WOULD CALL HIM BACK AS SOON AS POSSIBLE TODAY. BEST PHONE [phone] (CELL) MBC

## 2016-04-19 LAB — H.PYLORI BREATH TEST (REFLEX): H. pylori Breath Test: NEGATIVE

## 2016-04-19 LAB — H. PYLORI BREATH TEST

## 2016-04-28 ENCOUNTER — Ambulatory Visit: Payer: Medicaid Other

## 2016-04-28 DIAGNOSIS — F3160 Bipolar disorder, current episode mixed, unspecified: Secondary | ICD-10-CM

## 2016-04-28 NOTE — BH Specialist Note (Signed)
[name] DanceKeith reported that he got Disability. He said his anxiety level immediately dropped and he has been sleeping much better now. I congratulated him and we talked about the long process and struggle he went through for this. Plan to meet again in 6 weeks. Franne FortsKenny Lamar Naef, LCSW

## 2016-05-05 ENCOUNTER — Other Ambulatory Visit: Payer: Self-pay | Admitting: Internal Medicine

## 2016-05-05 DIAGNOSIS — G894 Chronic pain syndrome: Secondary | ICD-10-CM

## 2016-05-05 NOTE — Telephone Encounter (Signed)
Sending message to Dr. Drue SecondSnider for approval.

## 2016-05-17 ENCOUNTER — Other Ambulatory Visit: Payer: Self-pay | Admitting: Physician Assistant

## 2016-05-17 DIAGNOSIS — R1013 Epigastric pain: Secondary | ICD-10-CM

## 2016-05-18 NOTE — Telephone Encounter (Signed)
04/16/16 last refill and ov

## 2016-05-26 ENCOUNTER — Other Ambulatory Visit: Payer: Self-pay | Admitting: Internal Medicine

## 2016-05-26 DIAGNOSIS — J441 Chronic obstructive pulmonary disease with (acute) exacerbation: Secondary | ICD-10-CM

## 2016-05-26 DIAGNOSIS — F32A Depression, unspecified: Secondary | ICD-10-CM

## 2016-05-26 DIAGNOSIS — I10 Essential (primary) hypertension: Secondary | ICD-10-CM

## 2016-05-26 DIAGNOSIS — F329 Major depressive disorder, single episode, unspecified: Secondary | ICD-10-CM

## 2016-05-27 ENCOUNTER — Ambulatory Visit (INDEPENDENT_AMBULATORY_CARE_PROVIDER_SITE_OTHER): Payer: Medicare Other | Admitting: Internal Medicine

## 2016-05-27 ENCOUNTER — Encounter: Payer: Self-pay | Admitting: Internal Medicine

## 2016-05-27 VITALS — BP 136/80 | HR 88 | Temp 98.0°F | Wt 160.1 lb

## 2016-05-27 DIAGNOSIS — B20 Human immunodeficiency virus [HIV] disease: Secondary | ICD-10-CM

## 2016-05-27 DIAGNOSIS — J301 Allergic rhinitis due to pollen: Secondary | ICD-10-CM

## 2016-05-27 DIAGNOSIS — F908 Attention-deficit hyperactivity disorder, other type: Secondary | ICD-10-CM

## 2016-05-27 MED ORDER — ELVITEG-COBIC-EMTRICIT-TENOFAF 150-150-200-10 MG PO TABS: 1.0000 | ORAL_TABLET | Freq: Every day | ORAL | 11 refills | Status: DC

## 2016-05-27 MED ORDER — LORATADINE 10 MG PO TABS: 10.0000 mg | ORAL_TABLET | Freq: Every day | ORAL | 6 refills | Status: AC

## 2016-05-27 NOTE — Progress Notes (Signed)
RFV: hiv disease  Patient ID: [name] Smith, male   DOB: 13-Mar-1960, 56 y.o.   MRN: [id number]  HPI  [name] Smith is a 56yo M with HIV disease, CAD, COPD, depression. CD 4 count of 1270/VL<20 on stribild. He has been working with Bernette Redbird, Theatre manager, who feels that he has ADHD, which patient is known to have when he was younger. Maybe he would benefit from adderral.he feels that he has less stress since being awarded disability.  Outpatient Encounter Prescriptions as of 05/27/2016  Medication Sig  . albuterol (PROVENTIL HFA;VENTOLIN HFA) 108 (90 Base) MCG/ACT inhaler Inhale 2 puffs into the lungs every 6 (six) hours as needed for wheezing or shortness of breath.  . ARIPiprazole (ABILIFY) 15 MG tablet TAKE 1 TABLET BY MOUTH DAILY.  Marland Kitchen aspirin 325 MG EC tablet Take 325 mg by mouth daily.    Marland Kitchen buPROPion (WELLBUTRIN) 100 MG tablet Take 1 tablet (100 mg total) by mouth daily.  . clopidogrel (PLAVIX) 75 MG tablet Take 1 tablet (75 mg total) by mouth daily.  Marland Kitchen elvitegravir-cobicistat-emtricitabine-tenofovir (STRIBILD) 150-150-200-300 MG TABS tablet Take 1 tablet by mouth daily with breakfast.  . fluticasone (FLONASE) 50 MCG/ACT nasal spray INHALE 2 PUFFS PER NOSTRIL AT BEDTIME  . ibuprofen (ADVIL,MOTRIN) 600 MG tablet Take 1 tablet (600 mg total) by mouth every 6 (six) hours as needed for pain.  . metoprolol succinate (TOPROL-XL) 50 MG 24 hr tablet TAKE 1 TABLET BY MOUTH DAILY. TAKE WITH OR IMMEDIATELY FOLLOWING A MEAL.  . nitroGLYCERIN (NITROSTAT) 0.4 MG SL tablet Place 1 tablet (0.4 mg total) under the tongue every 5 (five) minutes as needed. For chest pain -- may repeat times three  . ranitidine (ZANTAC) 150 MG tablet TAKE 1 TABLET BY MOUTH TWICE A DAY  . rosuvastatin (CRESTOR) 20 MG tablet TAKE 1 TABLET (20 MG TOTAL) BY MOUTH DAILY.  . sildenafil (VIAGRA) 50 MG tablet Take 50 mg by mouth daily as needed for erectile dysfunction.  Marland Kitchen SPIRIVA HANDIHALER 18 MCG inhalation capsule PLACE 1  CAPSULE INTO INHALER AND INHALE DAILY.  Marland Kitchen sucralfate (CARAFATE) 1 GM/10ML suspension Take 10 mLs (1 g total) by mouth 4 (four) times daily -  with meals and at bedtime.  . traMADol (ULTRAM) 50 MG tablet TAKE 1 TABLET BY MOUTH EVERY 6 HOURS AS NEEDED FOR MODERATE TO SEVERE PAIN  . triamcinolone cream (KENALOG) 0.1 % Apply topically 2 (two) times daily.  . valACYclovir (VALTREX) 1000 MG tablet Take 1 tablet (1,000 mg total) by mouth 3 (three) times daily.   No facility-administered encounter medications on file as of 05/27/2016.      Patient Active Problem List   Diagnosis Date Noted  . Asthmatic bronchitis with acute exacerbation 01/03/2014  . Encounter for disability assessment 06/08/2013  . Murmur 10/14/2011  . URI (upper respiratory infection) 04/29/2011  . Bruit 10/28/2010  . SMOKER 11/06/2008  . COUGH 11/06/2008  . HYPERCHOLESTEROLEMIA 11/03/2008  . ANXIETY 11/03/2008  . Essential hypertension 11/03/2008  . GERD 11/03/2008  . NEPHROLITHIASIS 11/03/2008  . HIV INFECTION 03/20/2006  . CORONARY ARTERY DISEASE 03/20/2006  . CORONARY ARTERY BYPASS GRAFT, THREE VESSEL, HX OF 03/20/2006     Health Maintenance Due  Topic Date Due  . TETANUS/TDAP  03/04/1980  . COLONOSCOPY  03/05/2011     Review of Systems + allergic rhinitis. 10 point ros is negative Physical Exam   Wt 160 lb 1.9 oz (72.6 kg)   BMI 23.65 kg/m  Physical Exam  Constitutional: He  is oriented to person, place, and time. He appears well-developed and well-nourished. No distress.  HENT:  Mouth/Throat: Oropharynx is clear and moist. No oropharyngeal exudate.  Cardiovascular: Normal rate, regular rhythm and normal heart sounds. Exam reveals no gallop and no friction rub.  No murmur heard.  Pulmonary/Chest: Effort normal and breath sounds normal. No respiratory distress. He has no wheezes.  Abdominal: Soft. Bowel sounds are normal. He exhibits no distension. There is no tenderness.  Lymphadenopathy:  He has no  cervical adenopathy.  Neurological: He is alert and oriented to person, place, and time.  Skin: Skin is warm and dry. No rash noted. No erythema.  Psychiatric: He has a normal mood and affect. His behavior is normal.     Lab Results  Component Value Date   CD4TCELL 41 02/26/2016   Lab Results  Component Value Date   CD4TABS 1,270 02/26/2016   CD4TABS 1,220 08/24/2015   CD4TABS 1,030 04/20/2015   Lab Results  Component Value Date   HIV1RNAQUANT <20 02/26/2016   Lab Results  Component Value Date   HEPBSAB Yes 05/04/2006   Lab Results  Component Value Date   LABRPR NON REAC 02/26/2016    CBC Lab Results  Component Value Date   WBC 11.3 (H) 04/16/2016   RBC 5.00 04/16/2016   HGB 17.0 02/26/2016   HCT 50.8 04/16/2016   PLT 194 04/16/2016   MCV 102 (H) 04/16/2016   MCH 34.4 (H) 04/16/2016   MCHC 33.9 04/16/2016   RDW 13.6 04/16/2016   LYMPHSABS 3.1 04/16/2016   MONOABS 588 02/26/2016   EOSABS 0.2 04/16/2016    BMET Lab Results  Component Value Date   NA 141 04/16/2016   K 4.6 04/16/2016   CL 97 04/16/2016   CO2 21 04/16/2016   GLUCOSE 63 (L) 04/16/2016   BUN 12 04/16/2016   CREATININE 1.07 04/16/2016   CALCIUM 9.7 04/16/2016   GFRNONAA 78 04/16/2016   GFRAA 90 04/16/2016      Assessment and Plan  hiv disease = will switch to genvoya. Labs from dec 2017 shows that he is well controlled. Will repeat labs in 2 months to ensure controlled while on genvoya  Allergic rhinitis = will prescribe claritin to see if covered  adhd = refer to psychiatry for eval of adhd, and need for adderall

## 2016-05-27 NOTE — Patient Instructions (Signed)
Get labs 2-4 wk before next appt

## 2016-05-28 ENCOUNTER — Telehealth: Payer: Self-pay | Admitting: *Deleted

## 2016-05-28 NOTE — Telephone Encounter (Signed)
Urgent prior authorization initiated for Genvoya.  Reference #[id number], should have answer in 24 hours. Patient ID is [id number]. Left message for patient to update him. Andree CossHowell, Clara Smolen M, RN

## 2016-05-29 NOTE — Telephone Encounter (Addendum)
PA approved through 06/07/16 when NET Program ends.  Pt has been approved for permanent Part D Medicare coverage through [employer].  This Medicare coverage starts 06/08/16.  RN notified the patient's local pharmacy of this information and that the patient will need to provide a new insurance card.  Patient notified to take new ID card to pharmacy.

## 2016-06-17 ENCOUNTER — Ambulatory Visit: Payer: Medicaid Other

## 2016-06-21 ENCOUNTER — Other Ambulatory Visit: Payer: Self-pay | Admitting: Internal Medicine

## 2016-06-21 DIAGNOSIS — B20 Human immunodeficiency virus [HIV] disease: Secondary | ICD-10-CM

## 2016-06-21 DIAGNOSIS — E785 Hyperlipidemia, unspecified: Secondary | ICD-10-CM

## 2016-06-21 DIAGNOSIS — J441 Chronic obstructive pulmonary disease with (acute) exacerbation: Secondary | ICD-10-CM

## 2016-06-23 NOTE — Telephone Encounter (Signed)
RN spoke with Gay Filler, pharmacist.  [place] / Crestor medication issue that needs addressed by [facility] pharmacist.  Appointment scheduled with pharmacist for 06/24/16 to discuss.

## 2016-06-24 ENCOUNTER — Ambulatory Visit (INDEPENDENT_AMBULATORY_CARE_PROVIDER_SITE_OTHER): Payer: Medicare Other | Admitting: Pharmacist Clinician (PhC)/ Clinical Pharmacy Specialist

## 2016-06-24 DIAGNOSIS — B20 Human immunodeficiency virus [HIV] disease: Secondary | ICD-10-CM

## 2016-06-24 MED ORDER — BICTEGRAVIR-EMTRICITAB-TENOFOV 50-200-25 MG PO TABS: 1.0000 | ORAL_TABLET | Freq: Every day | ORAL | 11 refills | Status: DC

## 2016-06-24 MED FILL — BIKTARVY 50-200-25 MG TABS: 50-200-25 | 30 days supply | Qty: 30 | Fill #0

## 2016-06-24 NOTE — Patient Instructions (Signed)
Stop [employer] 1 daily at [employer]

## 2016-06-24 NOTE — Progress Notes (Signed)
HPI: [name] Smith is a 56 y.o. male who is here to discuss his ART and interaction issue.   Allergies: Allergies  Allergen Reactions  . Azithromycin Rash    Vitals:    Past Medical History: Past Medical History:  Diagnosis Date  . Anxiety state, unspecified   . Calculus of kidney   . Coronary atherosclerosis of unspecified type of vessel, native or graft   . Esophageal reflux   . Human immunodeficiency virus (HIV) disease   . Hypertension   . Need for prophylactic vaccination and inoculation against influenza   . Pure hypercholesterolemia     Social History: Social History   Social History  . Marital status: Single    Spouse name: N/A  . Number of children: N/A  . Years of education: N/A   Social History Main Topics  . Smoking status: Former Smoker    Packs/day: 1.00    Years: 36.00    Types: Cigarettes    Quit date: 12/12/2013  . Smokeless tobacco: Never Used  . Alcohol use 4.2 oz/week    7 Standard drinks or equivalent per week     Comment: wine  . Drug use: No  . Sexual activity: Not Currently    Partners: Male     Comment: accepted condoms   Other Topics Concern  . Not on file   Social History Narrative  . No narrative on file    Previous Regimen: Stribild  Current Regimen: Genvoya  Labs: HIV 1 RNA Quant (copies/mL)  Date Value  02/26/2016 <20  08/24/2015 <20  04/20/2015 <20   CD4 T Cell Abs (/uL)  Date Value  02/26/2016 1,270  08/24/2015 1,220  04/20/2015 1,030   Hep B S Ab (no units)  Date Value  05/04/2006 Yes   Hepatitis B Surface Ag (no units)  Date Value  05/04/2006 No   HCV Ab (no units)  Date Value  05/04/2006 No    CrCl: CrCl cannot be calculated (Patient's most recent lab result is older than the maximum 21 days allowed.).  Lipids:    Component Value Date/Time   CHOL 194 02/26/2016 1205   TRIG 236 (H) 02/26/2016 1205   HDL 46 02/26/2016 1205   CHOLHDL 4.2 02/26/2016 1205   VLDL 47 (H) 02/26/2016 1205   LDLCALC 101 (H) 02/26/2016 1205    Assessment: [name] Smith got a refill request for one of his meds yesterday and she asked me about if there are any interaction issues. He is currently doing really well on his Genvoya. However, he is also taking Crestor  and daily Flonase. The cobicistat will be an issue with both (also his abilify). It will increase the risk of adverse effects for rhabdo and adrenal suppression. Therefore, brought him in today to discuss the change of his Genvoya to [place]. He has a medicare part D plan and [facility] pharmacy can fill it with a $3.70 copay. He is going to pick it up today after the visit and would like the meds to be mailed to him from now on. He has a lab appt coming up and f/u with Dr. Drue Second so it'll work out perfectly.   Recommendations:  Dc Genvoya Start Biktarvy 1 PO qday F/u with labs and appt with Dr. Kristen Cardinal, PharmD, BCPS, AAHIVP, CPP Clinical Infectious Disease Pharmacist [facility] 06/24/2016, 11:39 AM

## 2016-06-25 ENCOUNTER — Telehealth: Payer: Self-pay | Admitting: Pharmacist Clinician (PhC)/ Clinical Pharmacy Specialist

## 2016-06-25 NOTE — Telephone Encounter (Signed)
They approved Biktarvy on a non-formulary exception. Faxed in the appeal today.

## 2016-06-26 ENCOUNTER — Telehealth: Payer: Self-pay

## 2016-06-26 NOTE — Telephone Encounter (Signed)
Spoke with [employer]. [name] Smith is approved for patient indefinitely while filled at [facility]. Patient has reached catastrophic limit so next fills will be $0.   Reference number for call: [id number]

## 2016-07-07 NOTE — Addendum Note (Signed)
Addended by: Jennet Maduro D on: 07/07/2016 03:23 PM   Modules accepted: Orders

## 2016-07-16 DIAGNOSIS — E782 Mixed hyperlipidemia: Secondary | ICD-10-CM | POA: Diagnosis not present

## 2016-07-16 DIAGNOSIS — I252 Old myocardial infarction: Secondary | ICD-10-CM | POA: Diagnosis not present

## 2016-07-16 DIAGNOSIS — K219 Gastro-esophageal reflux disease without esophagitis: Secondary | ICD-10-CM | POA: Diagnosis not present

## 2016-07-16 DIAGNOSIS — A6000 Herpesviral infection of urogenital system, unspecified: Secondary | ICD-10-CM | POA: Diagnosis not present

## 2016-07-16 DIAGNOSIS — Z0000 Encounter for general adult medical examination without abnormal findings: Secondary | ICD-10-CM | POA: Diagnosis not present

## 2016-07-16 DIAGNOSIS — J309 Allergic rhinitis, unspecified: Secondary | ICD-10-CM | POA: Diagnosis not present

## 2016-07-16 DIAGNOSIS — I2510 Atherosclerotic heart disease of native coronary artery without angina pectoris: Secondary | ICD-10-CM | POA: Diagnosis not present

## 2016-07-16 DIAGNOSIS — J449 Chronic obstructive pulmonary disease, unspecified: Secondary | ICD-10-CM | POA: Diagnosis not present

## 2016-07-16 DIAGNOSIS — Z125 Encounter for screening for malignant neoplasm of prostate: Secondary | ICD-10-CM | POA: Diagnosis not present

## 2016-07-16 DIAGNOSIS — I5022 Chronic systolic (congestive) heart failure: Secondary | ICD-10-CM | POA: Diagnosis not present

## 2016-07-16 DIAGNOSIS — F3181 Bipolar II disorder: Secondary | ICD-10-CM | POA: Diagnosis not present

## 2016-07-16 DIAGNOSIS — B20 Human immunodeficiency virus [HIV] disease: Secondary | ICD-10-CM | POA: Diagnosis not present

## 2016-07-17 ENCOUNTER — Encounter: Payer: Self-pay | Admitting: Cardiovascular Disease

## 2016-07-21 ENCOUNTER — Other Ambulatory Visit: Payer: Self-pay | Admitting: *Deleted

## 2016-07-21 DIAGNOSIS — J441 Chronic obstructive pulmonary disease with (acute) exacerbation: Secondary | ICD-10-CM

## 2016-07-21 MED ORDER — ALBUTEROL SULFATE HFA 108 (90 BASE) MCG/ACT IN AERS: 2.0000 | INHALATION_SPRAY | Freq: Four times a day (QID) | RESPIRATORY_TRACT | 6 refills | Status: AC | PRN

## 2016-07-24 NOTE — Addendum Note (Signed)
Addended by: Jennet MaduroESTRIDGE, DENISE D on: 07/24/2016 12:31 PM   Modules accepted: Orders

## 2016-07-29 ENCOUNTER — Other Ambulatory Visit: Payer: Medicaid Other

## 2016-07-30 MED FILL — BIKTARVY 50-200-25 MG TABS: 50-200-25 | 30 days supply | Qty: 30 | Fill #1

## 2016-08-05 ENCOUNTER — Other Ambulatory Visit: Payer: Self-pay | Admitting: Physician Assistant

## 2016-08-05 ENCOUNTER — Other Ambulatory Visit: Payer: Self-pay | Admitting: Internal Medicine

## 2016-08-05 ENCOUNTER — Other Ambulatory Visit: Payer: Self-pay | Admitting: Pharmacist Clinician (PhC)/ Clinical Pharmacy Specialist

## 2016-08-05 ENCOUNTER — Other Ambulatory Visit: Payer: Self-pay

## 2016-08-05 ENCOUNTER — Other Ambulatory Visit: Payer: Self-pay | Admitting: *Deleted

## 2016-08-05 ENCOUNTER — Ambulatory Visit (HOSPITAL_COMMUNITY): Payer: Medicare Other | Attending: Cardiovascular Disease

## 2016-08-05 DIAGNOSIS — I352 Nonrheumatic aortic (valve) stenosis with insufficiency: Secondary | ICD-10-CM | POA: Diagnosis not present

## 2016-08-05 DIAGNOSIS — Z951 Presence of aortocoronary bypass graft: Secondary | ICD-10-CM | POA: Diagnosis not present

## 2016-08-05 DIAGNOSIS — I77810 Thoracic aortic ectasia: Secondary | ICD-10-CM | POA: Insufficient documentation

## 2016-08-05 DIAGNOSIS — J441 Chronic obstructive pulmonary disease with (acute) exacerbation: Secondary | ICD-10-CM

## 2016-08-05 DIAGNOSIS — E785 Hyperlipidemia, unspecified: Secondary | ICD-10-CM | POA: Insufficient documentation

## 2016-08-05 DIAGNOSIS — I10 Essential (primary) hypertension: Secondary | ICD-10-CM | POA: Diagnosis not present

## 2016-08-05 DIAGNOSIS — Z720 Tobacco use: Secondary | ICD-10-CM | POA: Diagnosis not present

## 2016-08-05 DIAGNOSIS — B20 Human immunodeficiency virus [HIV] disease: Secondary | ICD-10-CM

## 2016-08-05 DIAGNOSIS — I359 Nonrheumatic aortic valve disorder, unspecified: Secondary | ICD-10-CM

## 2016-08-05 DIAGNOSIS — R1013 Epigastric pain: Secondary | ICD-10-CM

## 2016-08-05 DIAGNOSIS — I350 Nonrheumatic aortic (valve) stenosis: Secondary | ICD-10-CM | POA: Diagnosis present

## 2016-08-05 MED ORDER — UMECLIDINIUM BROMIDE 62.5 MCG/INH IN AEPB: 1.0000 | INHALATION_SPRAY | Freq: Every day | RESPIRATORY_TRACT | 5 refills | Status: DC

## 2016-08-05 MED ORDER — ROSUVASTATIN CALCIUM 20 MG PO TABS: ORAL_TABLET | ORAL | 5 refills | Status: DC

## 2016-08-05 NOTE — Progress Notes (Signed)
Insurance won't cover Spiriva anymore. Change to Incruse.

## 2016-08-06 NOTE — Telephone Encounter (Signed)
04/16/16 last ov

## 2016-08-06 NOTE — Telephone Encounter (Signed)
I recommend pt contact his PCP Dr. Lupita RaiderKimberlee Haider Hornaday with [facility] about this. Or obtain otc. But will pass on to Weber as well who wrote original rx.

## 2016-08-07 NOTE — Progress Notes (Signed)
Patient ID: [name] Smith, male   DOB: 1960/10/04, 56 y.o.   MRN: [id number]   [name] Smith is seen today for F/U of dyspnea, CAD S/P CABG x2 With redo 10/11. EF has been mildly reduced in the 45% range. No clinical history of CHF. No arryhthmia and no indication for AICD. Still smoking. Clinical COPD. Recent sinus infection with course of cipro.  Marland Kitchen. No SSCP, dyspnea from smoking. NO palpitatons or edema. Lots of stress regarding work and possible disability application. HIV stable and F/U with Dr Maurice MarchLane.  Antiretroviral changed and zocor stopped now on crestor  Having issues with depression ? Bipolar Discussed the fact that he should not take viagra or chantix Counseled at length about smoking cessation   Echo 07/31/15  Moderate AS/moderate AR EF 50-55%  Carotids 08/03/15  with 1-39% bilateral disease   Depression better has had his HIV meds changed Going to gym 5x/week Functional class 2  Discussed possible TAVR since he has had open heart srugery 2001 and 2011    Echo 08/05/16 reviewed and progression of AS EF 45-50% mean gradient 44 mmHg peak 57 mmHg moderate AR Root 39 mm    ROS: Denies fever, malais, weight loss, blurry vision, decreased visual acuity, cough, sputum, SOB, hemoptysis, pleuritic pain, palpitaitons, heartburn, abdominal pain, melena, lower extremity edema, claudication, or rash.  All other systems reviewed and negative  General: Affect appropriate Chronically ill white male  HEENT: normal Neck supple with no adenopathy JVP normal no bruits no thyromegaly Lungs clear with no wheezing and good diaphragmatic motion Heart:  S1/S2 muffled AS /AR  murmur, no rub, gallop or click PMI normal Abdomen: benighn, BS positve, no tenderness, no AAA no bruit.  No HSM or HJR Distal pulses intact with no bruits No edema Neuro non-focal Skin warm and dry No muscular weakness  Skin warm and dry No muscular weakness   Current Outpatient Prescriptions  Medication Sig Dispense  Refill  . albuterol (PROVENTIL HFA;VENTOLIN HFA) 108 (90 Base) MCG/ACT inhaler Inhale 2 puffs into the lungs every 6 (six) hours as needed for wheezing or shortness of breath. 1 Inhaler 6  . ARIPiprazole (ABILIFY) 15 MG tablet TAKE 1 TABLET BY MOUTH DAILY. 30 tablet 5  . aspirin 325 MG EC tablet Take 325 mg by mouth daily.      Marland Kitchen. buPROPion (WELLBUTRIN) 100 MG tablet Take 1 tablet (100 mg total) by mouth daily. 30 tablet 3  . clopidogrel (PLAVIX) 75 MG tablet Take 1 tablet (75 mg total) by mouth daily. 30 tablet 10  . fluticasone (FLONASE) 50 MCG/ACT nasal spray INHALE 2 PUFFS PER NOSTRIL AT BEDTIME 16 g 6  . GENVOYA 150-150-200-10 MG TABS tablet Take 1 tablet by mouth daily.    Marland Kitchen. ibuprofen (ADVIL,MOTRIN) 600 MG tablet Take 1 tablet (600 mg total) by mouth every 6 (six) hours as needed for pain. 30 tablet 0  . loratadine (CLARITIN) 10 MG tablet Take 1 tablet (10 mg total) by mouth daily. 30 tablet 6  . metoprolol succinate (TOPROL-XL) 50 MG 24 hr tablet TAKE 1 TABLET BY MOUTH DAILY. TAKE WITH OR IMMEDIATELY FOLLOWING A MEAL. 90 tablet 1  . nitroGLYCERIN (NITROSTAT) 0.4 MG SL tablet Place 1 tablet (0.4 mg total) under the tongue every 5 (five) minutes as needed. For chest pain -- may repeat times three 25 tablet 11  . ranitidine (ZANTAC) 150 MG tablet TAKE 1 TABLET BY MOUTH TWICE A DAY 60 tablet 0  . rosuvastatin (CRESTOR) 20 MG tablet TAKE  1 TABLET (20 MG TOTAL) BY MOUTH DAILY. 30 tablet 5  . sildenafil (VIAGRA) 50 MG tablet Take 50 mg by mouth daily as needed for erectile dysfunction.    . sucralfate (CARAFATE) 1 GM/10ML suspension Take 10 mLs (1 g total) by mouth 4 (four) times daily -  with meals and at bedtime. 420 mL 0  . traMADol (ULTRAM) 50 MG tablet TAKE 1 TABLET BY MOUTH EVERY 6 HOURS AS NEEDED FOR MODERATE TO SEVERE PAIN 30 tablet 2  . triamcinolone cream (KENALOG) 0.1 % Apply topically 2 (two) times daily. 30 g 1  . umeclidinium bromide (INCRUSE ELLIPTA) 62.5 MCG/INH AEPB Inhale 1 puff  into the lungs daily. 1 each 5  . valACYclovir (VALTREX) 1000 MG tablet Take 1 tablet (1,000 mg total) by mouth 3 (three) times daily. 21 tablet 3   No current facility-administered medications for this visit.     Allergies  Azithromycin  Electrocardiogram:   SR rate 102 LVH  03/25/13  05/02/14  SR rate 84 LVH no sig change  07/12/15 ST rate 100 LVH  08/08/16 SR rate 86 LVH   Assessment and Plan CAD/CABG:  X 2 last 2011 cath to delineate anatomy before deciding on AVR vs TAVR  HIV:  meds adjusted by ID need to be careful about statin interaction  COPD:  Stopped  smoking has more dyspnea   Bipolar:  On Abilify mood stable still very stressed about not getting disability Chol:  Now on crestor as HIV med changed  Lab Results  Component Value Date   LDLCALC 101 (H) 02/26/2016   Bruit:  F/u duplex ASA duplex  08/03/15 had plaque no stenosis   Aortic Stenosis: progressive disease mean gradient over 40 will arrange right and left cath And CT for beginning of TAVR evaluation Discussed with patient willing to proceed.   Labs called orders written  Charlton Haws

## 2016-08-08 ENCOUNTER — Encounter: Payer: Self-pay | Admitting: Cardiovascular Disease

## 2016-08-08 ENCOUNTER — Ambulatory Visit (INDEPENDENT_AMBULATORY_CARE_PROVIDER_SITE_OTHER): Payer: Medicare Other | Admitting: Cardiovascular Disease

## 2016-08-08 VITALS — BP 140/70 | HR 81 | Ht 68.0 in | Wt 159.0 lb

## 2016-08-08 DIAGNOSIS — I350 Nonrheumatic aortic (valve) stenosis: Secondary | ICD-10-CM | POA: Diagnosis not present

## 2016-08-08 MED ORDER — ASPIRIN EC 81 MG PO TBEC: 81.0000 mg | DELAYED_RELEASE_TABLET | Freq: Every day | ORAL | 3 refills | Status: AC

## 2016-08-08 NOTE — Telephone Encounter (Signed)
I  Gave him a month for him to be able to f/u with his PCP

## 2016-08-08 NOTE — Patient Instructions (Addendum)
Medication Instructions:  Your physician recommends that you continue on your current medications as directed. Please refer to the Current Medication list given to you today.  Labwork: Your physician recommends that you have lab work- BMET, CBC, and PT/INR   Testing/Procedures: Your physician has requested that you have a cardiac catheterization. Cardiac catheterization is used to diagnose and/or treat various heart conditions. Doctors may recommend this procedure for a number of different reasons. The most common reason is to evaluate chest pain. Chest pain can be a symptom of coronary artery disease (CAD), and cardiac catheterization can show whether plaque is narrowing or blocking your heart's arteries. This procedure is also used to evaluate the valves, as well as measure the blood flow and oxygen levels in different parts of your heart. For further information please visit [email or web address]. Please follow instruction sheet, as given.  Follow-Up: Your physician wants you to follow-up in: 2 to 3 weeks with Dr. Eden EmmsNishan or PA/NP to follow up after heart catheterization.  If you need a refill on your cardiac medications before your next appointment, please call your pharmacy.     [facility] [address] [facility] [address], [address] [place] [address] Dept: [phone] Loc: [phone]  Virl DiamondMichael K Masci  08/08/2016  You are scheduled for a Cardiac Catheterization on Tuesday, June 5 with Dr. Tonny BollmanMichael Cooper.  1. [facility] at [facility]: [address] [place] [address] at 10:00 am (two hours before your procedure to ensure your preparation). Free valet parking service is available.   Special note: Every effort is made to have your procedure done on time. Please understand that emergencies sometimes delay scheduled procedures.  2. Diet: Do not eat or drink  anything after midnight prior to your procedure except sips of water to take medications.  3. Labs: None needed.  4. Medication instructions in preparation for your procedure:   On the morning of your procedure, take your Aspirin and Plavix/Clopidogrel and any morning medicines NOT listed above.  You may use sips of water.  5. Plan for one night stay--bring personal belongings. 6. Bring a current list of your medications and current insurance cards. 7. You MUST have a responsible person to drive you home. 8. Someone MUST be with you the first 24 hours after you arrive home or your discharge will be delayed. 9. Please wear clothes that are easy to get on and off and wear slip-on shoes.  Thank you for allowing us to care for you!   -- [facility]

## 2016-08-09 LAB — CBC WITH DIFFERENTIAL/PLATELET
Basophils Absolute: 0 10*3/uL (ref 0.0–0.2)
Basos: 0 %
EOS (ABSOLUTE): 0 10*3/uL (ref 0.0–0.4)
Eos: 0 %
Hematocrit: 48.7 % (ref 37.5–51.0)
Hemoglobin: 16.5 g/dL (ref 13.0–17.7)
Immature Grans (Abs): 0 10*3/uL (ref 0.0–0.1)
Immature Granulocytes: 0 %
Lymphocytes Absolute: 3.1 10*3/uL (ref 0.7–3.1)
Lymphs: 37 %
MCH: 34.1 pg — ABNORMAL HIGH (ref 26.6–33.0)
MCHC: 33.9 g/dL (ref 31.5–35.7)
MCV: 101 fL — ABNORMAL HIGH (ref 79–97)
Monocytes Absolute: 0.5 10*3/uL (ref 0.1–0.9)
Monocytes: 6 %
Neutrophils Absolute: 4.8 10*3/uL (ref 1.4–7.0)
Neutrophils: 57 %
Platelets: 208 10*3/uL (ref 150–379)
RBC: 4.84 x10E6/uL (ref 4.14–5.80)
RDW: 13 % (ref 12.3–15.4)
WBC: 8.5 10*3/uL (ref 3.4–10.8)

## 2016-08-09 LAB — BASIC METABOLIC PANEL
BUN/Creatinine Ratio: 12 (ref 9–20)
BUN: 14 mg/dL (ref 6–24)
CO2: 20 mmol/L (ref 18–29)
Calcium: 9.2 mg/dL (ref 8.7–10.2)
Chloride: 102 mmol/L (ref 96–106)
Creatinine, Ser: 1.15 mg/dL (ref 0.76–1.27)
GFR calc Af Amer: 82 mL/min/{1.73_m2} (ref >59)
GFR calc non Af Amer: 71 mL/min/{1.73_m2} (ref >59)
Glucose: 175 mg/dL — ABNORMAL HIGH (ref 65–99)
Potassium: 4 mmol/L (ref 3.5–5.2)
Sodium: 139 mmol/L (ref 134–144)

## 2016-08-09 LAB — PROTIME-INR
INR: 0.9 (ref 0.8–1.2)
Prothrombin Time: 9.9 s (ref 9.1–12.0)

## 2016-08-11 ENCOUNTER — Other Ambulatory Visit: Payer: Self-pay | Admitting: Cardiovascular Disease

## 2016-08-11 ENCOUNTER — Telehealth: Payer: Self-pay

## 2016-08-11 DIAGNOSIS — I350 Nonrheumatic aortic (valve) stenosis: Secondary | ICD-10-CM

## 2016-08-11 NOTE — Telephone Encounter (Signed)
Patient [facility] scheduled for: 08/12/2016 @ 1200 Verified arrival time and place: Direction given to main entrance.  Confirmed arrival time of 1000 Confirmed AM meds to be taken pre-cath with sip of water:  Instructed Pt to take ASA and Plavix with sip of water Confirmed patient has responsible person to drive home post procedure and observe patient for 24 hours:  Pt states he has a driver ([facility] who will take him home.  Addl concerns:  None noted.

## 2016-08-12 ENCOUNTER — Other Ambulatory Visit: Payer: Self-pay

## 2016-08-12 ENCOUNTER — Ambulatory Visit (HOSPITAL_COMMUNITY)
Admission: RE | Admit: 2016-08-12 | Discharge: 2016-08-12 | Disposition: A | Discharge status: Home / Self Care | Payer: Medicare Other | Source: Ambulatory Visit | Attending: Cardiovascular Disease | Admitting: Cardiovascular Disease

## 2016-08-12 ENCOUNTER — Encounter (HOSPITAL_COMMUNITY)
Admission: RE | Disposition: A | Discharge status: Home / Self Care | Payer: Self-pay | Source: Ambulatory Visit | Attending: Cardiovascular Disease

## 2016-08-12 DIAGNOSIS — F17200 Nicotine dependence, unspecified, uncomplicated: Secondary | ICD-10-CM | POA: Insufficient documentation

## 2016-08-12 DIAGNOSIS — I350 Nonrheumatic aortic (valve) stenosis: Secondary | ICD-10-CM | POA: Diagnosis not present

## 2016-08-12 DIAGNOSIS — Z951 Presence of aortocoronary bypass graft: Secondary | ICD-10-CM | POA: Diagnosis not present

## 2016-08-12 DIAGNOSIS — I2510 Atherosclerotic heart disease of native coronary artery without angina pectoris: Secondary | ICD-10-CM | POA: Insufficient documentation

## 2016-08-12 DIAGNOSIS — I2582 Chronic total occlusion of coronary artery: Secondary | ICD-10-CM | POA: Diagnosis not present

## 2016-08-12 DIAGNOSIS — Z7901 Long term (current) use of anticoagulants: Secondary | ICD-10-CM | POA: Insufficient documentation

## 2016-08-12 DIAGNOSIS — Z881 Allergy status to other antibiotic agents status: Secondary | ICD-10-CM | POA: Diagnosis not present

## 2016-08-12 DIAGNOSIS — Z79899 Other long term (current) drug therapy: Secondary | ICD-10-CM | POA: Insufficient documentation

## 2016-08-12 DIAGNOSIS — F319 Bipolar disorder, unspecified: Secondary | ICD-10-CM | POA: Diagnosis not present

## 2016-08-12 DIAGNOSIS — J449 Chronic obstructive pulmonary disease, unspecified: Secondary | ICD-10-CM | POA: Insufficient documentation

## 2016-08-12 DIAGNOSIS — Z7982 Long term (current) use of aspirin: Secondary | ICD-10-CM | POA: Diagnosis not present

## 2016-08-12 DIAGNOSIS — Z21 Asymptomatic human immunodeficiency virus [HIV] infection status: Secondary | ICD-10-CM | POA: Insufficient documentation

## 2016-08-12 HISTORY — PX: RIGHT/LEFT HEART CATH AND CORONARY ANGIOGRAPHY: CATH118266

## 2016-08-12 LAB — POCT I-STAT 3, ART BLOOD GAS (G3+)
Acid-base deficit: 2 mmol/L (ref 0.0–2.0)
Bicarbonate: 25 mmol/L (ref 20.0–28.0)
O2 Saturation: 99 %
TCO2: 26 mmol/L (ref 0–100)
pCO2 arterial: 47.9 mmHg (ref 32.0–48.0)
pH, Arterial: 7.325 — ABNORMAL LOW (ref 7.350–7.450)
pO2, Arterial: 144 mmHg — ABNORMAL HIGH (ref 83.0–108.0)

## 2016-08-12 LAB — POCT I-STAT 3, VENOUS BLOOD GAS (G3P V)
Acid-base deficit: 2 mmol/L (ref 0.0–2.0)
Bicarbonate: 25.3 mmol/L (ref 20.0–28.0)
O2 Saturation: 75 %
TCO2: 27 mmol/L (ref 0–100)
pCO2, Ven: 50.3 mmHg (ref 44.0–60.0)
pH, Ven: 7.309 (ref 7.250–7.430)
pO2, Ven: 44 mmHg (ref 32.0–45.0)

## 2016-08-12 SURGERY — RIGHT/LEFT HEART CATH AND CORONARY ANGIOGRAPHY
Anesthesia: LOCAL

## 2016-08-12 MED ORDER — ASPIRIN 81 MG PO CHEW
CHEWABLE_TABLET | ORAL | Status: AC
  Filled 2016-08-12: qty 1

## 2016-08-12 MED ORDER — SODIUM CHLORIDE 0.9% FLUSH
3.0000 mL | Freq: Two times a day (BID) | INTRAVENOUS | Status: DC
Start: 2016-08-12 — End: 2016-08-12

## 2016-08-12 MED ORDER — IOPAMIDOL (ISOVUE-370) INJECTION 76%
INTRAVENOUS | Status: AC
  Filled 2016-08-12: qty 100

## 2016-08-12 MED ORDER — HEPARIN (PORCINE) IN NACL 2-0.9 UNIT/ML-% IJ SOLN
INTRAMUSCULAR | Status: AC
  Filled 2016-08-12: qty 1000

## 2016-08-12 MED ORDER — SODIUM CHLORIDE 0.9 % WEIGHT BASED INFUSION: 1.0000 mL/kg/h | INTRAVENOUS | Status: AC

## 2016-08-12 MED ORDER — HEPARIN (PORCINE) IN NACL 2-0.9 UNIT/ML-% IJ SOLN
INTRAMUSCULAR | Status: AC | PRN
  Administered 2016-08-12: 1000 mL

## 2016-08-12 MED ORDER — IOPAMIDOL (ISOVUE-370) INJECTION 76%
INTRAVENOUS | Status: DC | PRN
  Administered 2016-08-12: 130 mL via INTRA_ARTERIAL

## 2016-08-12 MED ORDER — SODIUM CHLORIDE 0.9 % IV SOLN: 250.0000 mL | INTRAVENOUS | Status: DC | PRN

## 2016-08-12 MED ORDER — LIDOCAINE HCL (PF) 1 % IJ SOLN
INTRAMUSCULAR | Status: DC | PRN
  Administered 2016-08-12: 15 mL

## 2016-08-12 MED ORDER — FENTANYL CITRATE (PF) 100 MCG/2ML IJ SOLN
INTRAMUSCULAR | Status: DC | PRN
  Administered 2016-08-12 (×2): 25 ug via INTRAVENOUS

## 2016-08-12 MED ORDER — SODIUM CHLORIDE 0.9 % WEIGHT BASED INFUSION: 1.0000 mL/kg/h | INTRAVENOUS | Status: DC

## 2016-08-12 MED ORDER — MIDAZOLAM HCL 2 MG/2ML IJ SOLN
INTRAMUSCULAR | Status: AC
  Filled 2016-08-12: qty 2

## 2016-08-12 MED ORDER — FENTANYL CITRATE (PF) 100 MCG/2ML IJ SOLN
INTRAMUSCULAR | Status: AC
  Filled 2016-08-12: qty 2

## 2016-08-12 MED ORDER — ASPIRIN 81 MG PO CHEW: 81.0000 mg | CHEWABLE_TABLET | ORAL | Status: DC

## 2016-08-12 MED ORDER — SODIUM CHLORIDE 0.9% FLUSH
3.0000 mL | INTRAVENOUS | Status: DC | PRN
Start: 2016-08-12 — End: 2016-08-12

## 2016-08-12 MED ORDER — LIDOCAINE HCL (PF) 1 % IJ SOLN
INTRAMUSCULAR | Status: AC
  Filled 2016-08-12: qty 30

## 2016-08-12 MED ORDER — SODIUM CHLORIDE 0.9 % WEIGHT BASED INFUSION
3.0000 mL/kg/h | INTRAVENOUS | Status: AC
  Administered 2016-08-12: 3 mL/kg/h via INTRAVENOUS

## 2016-08-12 MED ORDER — MIDAZOLAM HCL 2 MG/2ML IJ SOLN
INTRAMUSCULAR | Status: DC | PRN
  Administered 2016-08-12 (×2): 2 mg via INTRAVENOUS

## 2016-08-12 SURGICAL SUPPLY — 12 items
CATH INFINITI 5FR AL1 (CATHETERS) ×1 IMPLANT
CATH INFINITI 5FR MULTPACK ANG (CATHETERS) ×1 IMPLANT
CATH SWAN GANZ 7F STRAIGHT (CATHETERS) ×1 IMPLANT
GUIDEWIRE 3MM J TIP .035 145 (WIRE) ×1 IMPLANT
KIT HEART LEFT (KITS) ×2 IMPLANT
PACK CARDIAC CATHETERIZATION (CUSTOM PROCEDURE TRAY) ×2 IMPLANT
SHEATH PINNACLE 5F 10CM (SHEATH) ×1 IMPLANT
SHEATH PINNACLE 7F 10CM (SHEATH) ×1 IMPLANT
SYR MEDRAD MARK V 150ML (SYRINGE) ×2 IMPLANT
TRANSDUCER W/STOPCOCK (MISCELLANEOUS) ×2 IMPLANT
TUBING CIL FLEX 10 FLL-RA (TUBING) ×2 IMPLANT
WIRE EMERALD ST .035X260CM (WIRE) ×1 IMPLANT

## 2016-08-12 NOTE — Interval H&P Note (Signed)
History and Physical Interval Note:  08/12/2016 12:48 PM  [name] Smith  has presented today for surgery, with the diagnosis of arotic stenosis  The various methods of treatment have been discussed with the patient and family. After consideration of risks, benefits and other options for treatment, the patient has consented to  Procedure(s): Right/Left Heart Cath and Coronary Angiography (N/A) as a surgical intervention .  The patient's history has been reviewed, patient examined, no change in status, stable for surgery.  I have reviewed the patient's chart and labs.  Questions were answered to the patient's satisfaction.     Tonny Bollmanooper, Jerime

## 2016-08-12 NOTE — Discharge Instructions (Signed)

## 2016-08-12 NOTE — H&P (View-Only) (Signed)
Patient ID: [name] Smith, male   DOB: 1960/10/04, 56 y.o.   MRN: [id number]   [name] Smith is seen today for F/U of dyspnea, CAD S/P CABG x2 With redo 10/11. EF has been mildly reduced in the 45% range. No clinical history of CHF. No arryhthmia and no indication for AICD. Still smoking. Clinical COPD. Recent sinus infection with course of cipro.  Marland Kitchen. No SSCP, dyspnea from smoking. NO palpitatons or edema. Lots of stress regarding work and possible disability application. HIV stable and F/U with [name] Smith.  Antiretroviral changed and zocor stopped now on crestor  Having issues with depression ? Bipolar Discussed the fact that he should not take viagra or chantix Counseled at length about smoking cessation   Echo 07/31/15  Moderate AS/moderate AR EF 50-55%  Carotids 08/03/15  with 1-39% bilateral disease   Depression better has had his HIV meds changed Going to gym 5x/week Functional class 2  Discussed possible TAVR since he has had open heart srugery 2001 and 2011    Echo 08/05/16 reviewed and progression of AS EF 45-50% mean gradient 44 mmHg peak 57 mmHg moderate AR Root 39 mm    ROS: Denies fever, malais, weight loss, blurry vision, decreased visual acuity, cough, sputum, SOB, hemoptysis, pleuritic pain, palpitaitons, heartburn, abdominal pain, melena, lower extremity edema, claudication, or rash.  All other systems reviewed and negative  General: Affect appropriate Chronically ill white male  HEENT: normal Neck supple with no adenopathy JVP normal no bruits no thyromegaly Lungs clear with no wheezing and good diaphragmatic motion Heart:  S1/S2 muffled AS /AR  murmur, no rub, gallop or click PMI normal Abdomen: benighn, BS positve, no tenderness, no AAA no bruit.  No HSM or HJR Distal pulses intact with no bruits No edema Neuro non-focal Skin warm and dry No muscular weakness  Skin warm and dry No muscular weakness   Current Outpatient Prescriptions  Medication Sig Dispense  Refill  . albuterol (PROVENTIL HFA;VENTOLIN HFA) 108 (90 Base) MCG/ACT inhaler Inhale 2 puffs into the lungs every 6 (six) hours as needed for wheezing or shortness of breath. 1 Inhaler 6  . ARIPiprazole (ABILIFY) 15 MG tablet TAKE 1 TABLET BY MOUTH DAILY. 30 tablet 5  . aspirin 325 MG EC tablet Take 325 mg by mouth daily.      Marland Kitchen. buPROPion (WELLBUTRIN) 100 MG tablet Take 1 tablet (100 mg total) by mouth daily. 30 tablet 3  . clopidogrel (PLAVIX) 75 MG tablet Take 1 tablet (75 mg total) by mouth daily. 30 tablet 10  . fluticasone (FLONASE) 50 MCG/ACT nasal spray INHALE 2 PUFFS PER NOSTRIL AT BEDTIME 16 g 6  . GENVOYA 150-150-200-10 MG TABS tablet Take 1 tablet by mouth daily.    Marland Kitchen. ibuprofen (ADVIL,MOTRIN) 600 MG tablet Take 1 tablet (600 mg total) by mouth every 6 (six) hours as needed for pain. 30 tablet 0  . loratadine (CLARITIN) 10 MG tablet Take 1 tablet (10 mg total) by mouth daily. 30 tablet 6  . metoprolol succinate (TOPROL-XL) 50 MG 24 hr tablet TAKE 1 TABLET BY MOUTH DAILY. TAKE WITH OR IMMEDIATELY FOLLOWING A MEAL. 90 tablet 1  . nitroGLYCERIN (NITROSTAT) 0.4 MG SL tablet Place 1 tablet (0.4 mg total) under the tongue every 5 (five) minutes as needed. For chest pain -- may repeat times three 25 tablet 11  . ranitidine (ZANTAC) 150 MG tablet TAKE 1 TABLET BY MOUTH TWICE A DAY 60 tablet 0  . rosuvastatin (CRESTOR) 20 MG tablet TAKE  1 TABLET (20 MG TOTAL) BY MOUTH DAILY. 30 tablet 5  . sildenafil (VIAGRA) 50 MG tablet Take 50 mg by mouth daily as needed for erectile dysfunction.    . sucralfate (CARAFATE) 1 GM/10ML suspension Take 10 mLs (1 g total) by mouth 4 (four) times daily -  with meals and at bedtime. 420 mL 0  . traMADol (ULTRAM) 50 MG tablet TAKE 1 TABLET BY MOUTH EVERY 6 HOURS AS NEEDED FOR MODERATE TO SEVERE PAIN 30 tablet 2  . triamcinolone cream (KENALOG) 0.1 % Apply topically 2 (two) times daily. 30 g 1  . umeclidinium bromide (INCRUSE ELLIPTA) 62.5 MCG/INH AEPB Inhale 1 puff  into the lungs daily. 1 each 5  . valACYclovir (VALTREX) 1000 MG tablet Take 1 tablet (1,000 mg total) by mouth 3 (three) times daily. 21 tablet 3   No current facility-administered medications for this visit.     Allergies  Azithromycin  Electrocardiogram:   SR rate 102 LVH  03/25/13  05/02/14  SR rate 84 LVH no sig change  07/12/15 ST rate 100 LVH  08/08/16 SR rate 86 LVH   Assessment and Plan CAD/CABG:  X 2 last 2011 cath to delineate anatomy before deciding on AVR vs TAVR  HIV:  meds adjusted by ID need to be careful about statin interaction  COPD:  Stopped  smoking has more dyspnea   Bipolar:  On Abilify mood stable still very stressed about not getting disability Chol:  Now on crestor as HIV med changed  Lab Results  Component Value Date   LDLCALC 101 (H) 02/26/2016   Bruit:  F/u duplex ASA duplex  08/03/15 had plaque no stenosis   Aortic Stenosis: progressive disease mean gradient over 40 will arrange right and left cath And CT for beginning of TAVR evaluation Discussed with patient willing to proceed.   Labs called orders written  [name] Smith

## 2016-08-12 NOTE — Progress Notes (Addendum)
Site area: RFA/RFV Site Prior to Removal:  Level 0 Pressure Applied For:30 min Manual:   yes Patient Status During Pull:  stable Post Pull Site:  Level 0 Post Pull Instructions Given: yes  Post Pull Pulses Present: palpable Dressing Applied:  tegaderm/pressure Bedrest begins @ 1500 till 1900 Comments:

## 2016-08-13 ENCOUNTER — Encounter (HOSPITAL_COMMUNITY): Payer: Self-pay | Admitting: Cardiovascular Disease

## 2016-08-13 ENCOUNTER — Telehealth: Payer: Self-pay

## 2016-08-13 DIAGNOSIS — I350 Nonrheumatic aortic (valve) stenosis: Secondary | ICD-10-CM

## 2016-08-13 NOTE — Telephone Encounter (Addendum)
-----   Message from Wendall StadePeter C Nishan, MD sent at 08/13/2016 10:34 AM EDT ----- Cindee LamePete-  Can you see him in office and evaluate I think it would be better to attempt TAVR rather than 3rd sternotomy   Pam- needs to be set up for cardiac CTA to size TAVR valve do on 09/18/16    Ordered CTA and BMET for patient to have next month. [facility].

## 2016-08-19 ENCOUNTER — Ambulatory Visit: Payer: Medicaid Other | Admitting: Internal Medicine

## 2016-08-21 MED FILL — BIKTARVY 50-200-25 MG TABS: 50-200-25 | 30 days supply | Qty: 30 | Fill #2

## 2016-08-26 NOTE — Progress Notes (Signed)
Patient ID: [name] Smith, male   DOB: 06-Feb-1961, 56 y.o.   MRN: [id number]   [name] Smith is seen today for F/U of dyspnea, CAD S/P CABG x2 With redo 10/11. EF has been mildly reduced in the 45% range. No clinical history of CHF. No arryhthmia and no indication for AICD. Still smoking. Clinical COPD. Recent sinus infection with course of cipro.  Marland Kitchen No SSCP, dyspnea from smoking. NO palpitatons or edema. Lots of stress regarding work and possible disability application. HIV stable and F/U with [name] Smith.  Antiretroviral changed and zocor stopped now on crestor  Having issues with depression ? Bipolar Discussed the fact that he should not take viagra or chantix Counseled at length about smoking cessation   Echo 07/31/15  Moderate AS/moderate AR EF 50-55%  Carotids 08/03/15  with 1-39% bilateral disease   Depression better has had his HIV meds changed Going to gym 5x/week Functional class 2  Discussed possible TAVR since he has had open heart srugery 2001 and 2011 Had right and left cath with [name] Smith on 08/12/16 Reviewed Mixed AS/AR significant   Conclusion   1. Severe native coronary artery disease with severe stenosis of the left main and total occlusion of the LAD, left circumflex, and right coronary arteries 2. Status post redo CABG with continued patency of the LIMA to LAD, saphenous vein graft OM, and sequential saphenous vein graft to PDA and PLA branches. All other saphenous vein grafts are occluded. 3. Mixed aortic valve disease with aortic stenosis and 3+ insufficiency 4. Normal right heart hemodynamics  Recommendations: Will review case with [name]. Eden Smith. The patient's aortic valve gradients are less by cardiac catheterization than that of his echo study. However, his aortic insufficiency is more impressive by aortic root angiography. I suspect he has mixed disease with an increase in his gradient related to flow. His aortic valve area is overestimated by this study because of a high cardiac  output based on Fick calculation.        Echo 08/05/16 reviewed and progression of AS EF 45-50% mean gradient 44 mmHg peak 57 mmHg moderate AR Root 39 mm   Having left leg pain wonders if its from statin But clearly unilateral   ROS: Denies fever, malais, weight loss, blurry vision, decreased visual acuity, cough, sputum, SOB, hemoptysis, pleuritic pain, palpitaitons, heartburn, abdominal pain, melena, lower extremity edema, claudication, or rash.  All other systems reviewed and negative  General: Affect appropriate Chronically ill white male  HEENT: normal Neck supple with no adenopathy JVP normal no bruits no thyromegaly Lungs clear with no wheezing and good diaphragmatic motion Heart:  S1/S2 muffled AS /AR  murmur, no rub, gallop or click PMI normal Abdomen: benighn, BS positve, no tenderness, no AAA no bruit.  No HSM or HJR Distal pulses intact with no bruits No edema Neuro non-focal Skin warm and dry No muscular weakness  Skin warm and dry No muscular weakness   Current Outpatient Prescriptions  Medication Sig Dispense Refill  . albuterol (PROVENTIL HFA;VENTOLIN HFA) 108 (90 Base) MCG/ACT inhaler Inhale 2 puffs into the lungs every 6 (six) hours as needed for wheezing or shortness of breath. 1 Inhaler 6  . ARIPiprazole (ABILIFY) 15 MG tablet TAKE 1 TABLET BY MOUTH DAILY. 30 tablet 5  . aspirin EC 81 MG tablet Take 1 tablet (81 mg total) by mouth daily. 90 tablet 3  . bictegravir-emtricitabine-tenofovir AF (BIKTARVY) 50-200-25 MG TABS tablet Take 1 tablet by mouth daily.    Marland Kitchen buPROPion (  WELLBUTRIN) 100 MG tablet Take 1 tablet (100 mg total) by mouth daily. 30 tablet 3  . clopidogrel (PLAVIX) 75 MG tablet Take 1 tablet (75 mg total) by mouth daily. 30 tablet 10  . fluticasone (FLONASE) 50 MCG/ACT nasal spray Place 1 spray into both nostrils daily.    . GENVOYA 150-150-200-10 MG TABS tablet Take 1 tablet by mouth daily.    Marland Kitchen ibuprofen (ADVIL,MOTRIN) 600 MG tablet Take 1  tablet (600 mg total) by mouth every 6 (six) hours as needed for pain. 30 tablet 0  . loratadine (CLARITIN) 10 MG tablet Take 1 tablet (10 mg total) by mouth daily. 30 tablet 6  . metoprolol succinate (TOPROL-XL) 50 MG 24 hr tablet TAKE 1 TABLET BY MOUTH DAILY. TAKE WITH OR IMMEDIATELY FOLLOWING A MEAL. 90 tablet 1  . Multiple Vitamin (MULTIVITAMIN WITH MINERALS) TABS tablet Take 1 tablet by mouth daily.    . nitroGLYCERIN (NITROSTAT) 0.4 MG SL tablet Place 1 tablet (0.4 mg total) under the tongue every 5 (five) minutes as needed. For chest pain -- may repeat times three 25 tablet 11  . ranitidine (ZANTAC) 150 MG tablet TAKE 1 TABLET BY MOUTH TWICE A DAY 60 tablet 0  . rosuvastatin (CRESTOR) 20 MG tablet TAKE 1 TABLET (20 MG TOTAL) BY MOUTH DAILY. 30 tablet 5  . sildenafil (VIAGRA) 50 MG tablet Take 50 mg by mouth daily as needed for erectile dysfunction.    . sucralfate (CARAFATE) 1 GM/10ML suspension Take 10 mLs (1 g total) by mouth 4 (four) times daily -  with meals and at bedtime. 420 mL 0  . tiotropium (SPIRIVA) 18 MCG inhalation capsule Place 18 mcg into inhaler and inhale daily.    . traMADol (ULTRAM) 50 MG tablet TAKE 1 TABLET BY MOUTH EVERY 6 HOURS AS NEEDED FOR MODERATE TO SEVERE PAIN 30 tablet 2  . triamcinolone cream (KENALOG) 0.1 % Apply topically 2 (two) times daily. (Patient taking differently: Apply 1 application topically 2 (two) times daily as needed (rash). ) 30 g 1  . umeclidinium bromide (INCRUSE ELLIPTA) 62.5 MCG/INH AEPB Inhale 1 puff into the lungs daily. (Patient taking differently: Inhale 2 puffs into the lungs daily as needed (breathing). ) 1 each 5  . valACYclovir (VALTREX) 1000 MG tablet Take 1 tablet (1,000 mg total) by mouth 3 (three) times daily. (Patient taking differently: Take 500 mg by mouth 3 (three) times daily as needed (fever blisters). ) 21 tablet 3   No current facility-administered medications for this visit.      Allergies  Azithromycin  Electrocardiogram:   SR rate 102 LVH  03/25/13  05/02/14  SR rate 84 LVH no sig change  07/12/15 ST rate 100 LVH  08/08/16 SR rate 86 LVH   Assessment and Plan CAD/CABG:  X 2 last 2011  Patent grafts to LAD/OM and RCA Agree with [name] Smith TAVR would be better than  3rd sternotomy Will have cardiac CTA to size annulus 09/18/16 with new scanner and f/u with CVTS for 2 opinions Regarding TAVR HIV:  meds adjusted by ID need to be careful about statin interaction  COPD:  Stopped  smoking has more dyspnea   Bipolar:  On Abilify mood stable still very stressed about not getting disability Chol:  Now on crestor as HIV med changed  Doubt leg pain from this but will have him take it qod for 3 weeks and see if helps Lab Results  Component Value Date   LDLCALC 101 (H) 02/26/2016  Bruit:  F/u duplex ASA duplex  08/03/15 had plaque no stenosis   Aortic Stenosis: progressive with moderate AR by cath as well TAVR evaluation in progress     Charlton HawsPeter Nishan

## 2016-08-28 ENCOUNTER — Ambulatory Visit (INDEPENDENT_AMBULATORY_CARE_PROVIDER_SITE_OTHER): Payer: Medicare Other | Admitting: Cardiovascular Disease

## 2016-08-28 ENCOUNTER — Encounter: Payer: Self-pay | Admitting: Cardiovascular Disease

## 2016-08-28 VITALS — BP 126/60 | HR 72 | Ht 68.0 in | Wt 159.8 lb

## 2016-08-28 DIAGNOSIS — I350 Nonrheumatic aortic (valve) stenosis: Secondary | ICD-10-CM | POA: Diagnosis not present

## 2016-08-28 NOTE — Patient Instructions (Addendum)
Medication Instructions:  Your physician recommends that you continue on your current medications as directed. Please refer to the Current Medication list given to you today.  Labwork: Your physician recommends that you return for lab work on July 9 th, 10th, or 11th for BMET.  Testing/Procedures: Keep your appointment for your CT on 09/18/16.  Follow-Up: Your physician wants you to follow-up in: November with Dr. Eden EmmsNishan.   Dr. Earmon Phoenixooper's nurse will call you to schedule an appointment if needed.  If you need a refill on your cardiac medications before your next appointment, please call your pharmacy.

## 2016-09-02 ENCOUNTER — Other Ambulatory Visit: Payer: Medicare Other

## 2016-09-02 ENCOUNTER — Encounter: Payer: Self-pay | Admitting: Cardiothoracic Surgery

## 2016-09-02 ENCOUNTER — Institutional Professional Consult (permissible substitution) (INDEPENDENT_AMBULATORY_CARE_PROVIDER_SITE_OTHER): Payer: Medicare Other | Admitting: Cardiothoracic Surgery

## 2016-09-02 VITALS — BP 155/84 | HR 90 | Resp 20 | Ht 68.0 in | Wt 155.0 lb

## 2016-09-02 DIAGNOSIS — B20 Human immunodeficiency virus [HIV] disease: Secondary | ICD-10-CM

## 2016-09-02 DIAGNOSIS — I350 Nonrheumatic aortic (valve) stenosis: Secondary | ICD-10-CM | POA: Diagnosis not present

## 2016-09-02 DIAGNOSIS — Z951 Presence of aortocoronary bypass graft: Secondary | ICD-10-CM

## 2016-09-02 DIAGNOSIS — I2510 Atherosclerotic heart disease of native coronary artery without angina pectoris: Secondary | ICD-10-CM | POA: Diagnosis not present

## 2016-09-02 DIAGNOSIS — I351 Nonrheumatic aortic (valve) insufficiency: Secondary | ICD-10-CM

## 2016-09-02 LAB — CBC WITH DIFFERENTIAL/PLATELET
Basophils Absolute: 0 cells/uL (ref 0–200)
Basophils Relative: 0 %
Eosinophils Absolute: 88 cells/uL (ref 15–500)
Eosinophils Relative: 1 %
HCT: 46.9 % (ref 38.5–50.0)
Hemoglobin: 15.8 g/dL (ref 13.2–17.1)
Lymphocytes Relative: 35 %
Lymphs Abs: 3080 cells/uL (ref 850–3900)
MCH: 33.9 pg — ABNORMAL HIGH (ref 27.0–33.0)
MCHC: 33.7 g/dL (ref 32.0–36.0)
MCV: 100.6 fL — ABNORMAL HIGH (ref 80.0–100.0)
MPV: 9.8 fL (ref 7.5–12.5)
Monocytes Absolute: 528 cells/uL (ref 200–950)
Monocytes Relative: 6 %
Neutro Abs: 5104 cells/uL (ref 1500–7800)
Neutrophils Relative %: 58 %
Platelets: 238 10*3/uL (ref 140–400)
RBC: 4.66 MIL/uL (ref 4.20–5.80)
RDW: 12.8 % (ref 11.0–15.0)
WBC: 8.8 10*3/uL (ref 3.8–10.8)

## 2016-09-02 LAB — COMPREHENSIVE METABOLIC PANEL
ALT: 15 U/L (ref 9–46)
AST: 16 U/L (ref 10–35)
Albumin: 4.1 g/dL (ref 3.6–5.1)
Alkaline Phosphatase: 51 U/L (ref 40–115)
BUN: 14 mg/dL (ref 7–25)
CO2: 25 mmol/L (ref 20–31)
Calcium: 9.7 mg/dL (ref 8.6–10.3)
Chloride: 104 mmol/L (ref 98–110)
Creat: 1.04 mg/dL (ref 0.70–1.33)
Glucose, Bld: 82 mg/dL (ref 65–99)
Potassium: 4.3 mmol/L (ref 3.5–5.3)
Sodium: 138 mmol/L (ref 135–146)
Total Bilirubin: 0.8 mg/dL (ref 0.2–1.2)
Total Protein: 6.7 g/dL (ref 6.1–8.1)

## 2016-09-02 NOTE — Progress Notes (Signed)
PCP is Lupita RaiderShaw, [name], MD Referring Provider is Wendall StadeNishan, Peter C, MD  Chief Complaint  Patient presents with  . Coronary Artery Disease    Surgical eval, ECHO 08/05/16, Cardiac Cath 08/12/16, HX of CABG  . Aortic Stenosis  . Aortic Insuffiency   Patient examined, most recent coronary angiogram, ventriculogram, and transthoracic echocardiogram images personally reviewed and counseled with patient Mean gradient 244 mmHg: 56 year old nondiabetic smoker with history of severe coronary artery disease. He had CABG in 2001 and redo CABG 4 in 2011. He has been followed carefully by Dr. Eden EmmsNishan for moderate to severe aortic stenosis, moderate aortic regurgitation. His most recent echocardiogram showed an increased gradient-peak gradient 57 mmHg, mean gradient 44 mmHg, valve area 0.77. The patient denies syncope angina or heart failure symptoms He currently smokes but follows a heart healthy diet and has scheduled aerobic activity at a fitness center 5 days a week.  His most recent coronary arteriogram show his native vessels to the atretic and he is living off his patent bypass grafts, saphenous vein to OM, memory artery to LAD, sequential saphenous vein graft to posterior descending and posterior lateral branch right coronary.  The patient would benefit from aortic valve replacement for his severe aortic stenosis. He has mild-moderate LV dysfunction EF of 45%. A third sternotomy would be at extremely high risk for several reasons. Sternal adhesions and exsanguination with third time sternotomy, scarring of the ascending aorta [where aortotomy would be needed for aVR] from 2 previous coronary bypass operation proximal anastomoses, risk to injury to a patent vein graft which would be difficult to repair since all of his saphenous vein has been previously harvested. Past Medical History:  Diagnosis Date  . Anxiety state, unspecified   . Calculus of kidney   . Coronary atherosclerosis of unspecified type of  vessel, native or graft   . Esophageal reflux   . Human immunodeficiency virus (HIV) disease (HCC)   . Hypertension   . Need for prophylactic vaccination and inoculation against influenza   . Pure hypercholesterolemia     Past Surgical History:  Procedure Laterality Date  . CORONARY ARTERY BYPASS GRAFT  12/1999, 12/2009   2001 - 4V, 2011 - 4V  . PTCA  2004   1 stent placed  . RIGHT/LEFT HEART CATH AND CORONARY ANGIOGRAPHY N/A 08/12/2016   Procedure: Right/Left Heart Cath and Coronary Angiography;  Surgeon: Tonny Bollmanooper, Andersen, MD;  [facility];  Service: Cardiovascular;  Laterality: N/A;    Family History  Problem Relation Age of Onset  . Arthritis Mother     Social History Social History  Substance Use Topics  . Smoking status: Former Smoker    Packs/day: 1.00    Years: 36.00    Types: Cigarettes    Quit date: 12/12/2013  . Smokeless tobacco: Never Used  . Alcohol use 4.2 oz/week    7 Standard drinks or equivalent per week     Comment: wine    Current Outpatient Prescriptions  Medication Sig Dispense Refill  . albuterol (PROVENTIL HFA;VENTOLIN HFA) 108 (90 Base) MCG/ACT inhaler Inhale 2 puffs into the lungs every 6 (six) hours as needed for wheezing or shortness of breath. 1 Inhaler 6  . ARIPiprazole (ABILIFY) 15 MG tablet TAKE 1 TABLET BY MOUTH DAILY. 30 tablet 5  . aspirin EC 81 MG tablet Take 1 tablet (81 mg total) by mouth daily. 90 tablet 3  . bictegravir-emtricitabine-tenofovir AF (BIKTARVY) 50-200-25 MG TABS tablet Take 1 tablet by mouth daily.    .Marland Kitchen  buPROPion (WELLBUTRIN) 100 MG tablet Take 1 tablet (100 mg total) by mouth daily. 30 tablet 3  . clopidogrel (PLAVIX) 75 MG tablet Take 1 tablet (75 mg total) by mouth daily. 30 tablet 10  . fluticasone (FLONASE) 50 MCG/ACT nasal spray Place 1 spray into both nostrils daily.    . GENVOYA 150-150-200-10 MG TABS tablet Take 1 tablet by mouth daily.    Marland Kitchen ibuprofen (ADVIL,MOTRIN) 600 MG tablet Take 1 tablet  (600 mg total) by mouth every 6 (six) hours as needed for pain. 30 tablet 0  . loratadine (CLARITIN) 10 MG tablet Take 1 tablet (10 mg total) by mouth daily. 30 tablet 6  . metoprolol succinate (TOPROL-XL) 50 MG 24 hr tablet TAKE 1 TABLET BY MOUTH DAILY. TAKE WITH OR IMMEDIATELY FOLLOWING A MEAL. 90 tablet 1  . Multiple Vitamin (MULTIVITAMIN WITH MINERALS) TABS tablet Take 1 tablet by mouth daily.    . nitroGLYCERIN (NITROSTAT) 0.4 MG SL tablet Place 1 tablet (0.4 mg total) under the tongue every 5 (five) minutes as needed. For chest pain -- may repeat times three 25 tablet 11  . ranitidine (ZANTAC) 150 MG tablet TAKE 1 TABLET BY MOUTH TWICE A DAY 60 tablet 0  . rosuvastatin (CRESTOR) 20 MG tablet TAKE 1 TABLET (20 MG TOTAL) BY MOUTH DAILY. 30 tablet 5  . sildenafil (VIAGRA) 50 MG tablet Take 50 mg by mouth daily as needed for erectile dysfunction.    . sucralfate (CARAFATE) 1 GM/10ML suspension Take 10 mLs (1 g total) by mouth 4 (four) times daily -  with meals and at bedtime. 420 mL 0  . tiotropium (SPIRIVA) 18 MCG inhalation capsule Place 18 mcg into inhaler and inhale daily.    . traMADol (ULTRAM) 50 MG tablet TAKE 1 TABLET BY MOUTH EVERY 6 HOURS AS NEEDED FOR MODERATE TO SEVERE PAIN 30 tablet 2  . triamcinolone cream (KENALOG) 0.1 % Apply topically 2 (two) times daily. (Patient taking differently: Apply 1 application topically 2 (two) times daily as needed (rash). ) 30 g 1  . umeclidinium bromide (INCRUSE ELLIPTA) 62.5 MCG/INH AEPB Inhale 1 puff into the lungs daily. (Patient taking differently: Inhale 2 puffs into the lungs daily as needed (breathing). ) 1 each 5  . valACYclovir (VALTREX) 1000 MG tablet Take 1 tablet (1,000 mg total) by mouth 3 (three) times daily. (Patient taking differently: Take 500 mg by mouth 3 (three) times daily as needed (fever blisters). ) 21 tablet 3   No current facility-administered medications for this visit.     Allergies  Allergen Reactions  . Azithromycin  Rash    Review of Systems         Review of Systems :  [ y ] = yes, [  ] = no        General :  Weight gain [   ]    Weight loss  [   ]  Fatigue [  ]  Fever [  ]  Chills  [  ]                                Weakness  [  ]           HEENT    Headache [  ]  Dizziness [  ]  Blurred vision [  ] Glaucoma  [  ]  Nosebleeds [  ] Painful or loose teeth [  ]        Cardiac :  Chest pain/ pressure [  ]  Resting SOB [  ] exertional SOB [ yes ]                        Orthopnea [  ]  Pedal edema  [  ]  Palpitations [  ] Syncope/presyncope [ ]                         Paroxysmal nocturnal dyspnea [  ]         Pulmonary : cough [  ]  wheezing [  ]  Hemoptysis [  ] Sputum [  ] Snoring [  ]                              Pneumothorax [  ]  Sleep apnea [  ]        GI : Vomiting [  ]  Dysphagia [  ]  Melena  [  ]  Abdominal pain [  ] BRBPR [  ]              Heart burn [  ]  Constipation [  ] Diarrhea  [  ] Colonoscopy [   ]        GU : Hematuria [  ]  Dysuria [  ]  Nocturia [  ] UTI's [  ]        Vascular : Claudication [  ]  Rest pain [  ]  DVT [  ] Vein stripping [  ] leg ulcers [  ]                          TIA [  ] Stroke [  ]  Varicose veins [  ]bilateral saphenous vein harvest from previous CABG procedures        NEURO :  Headaches  [  ] Seizures [  ] Vision changes [  ] Paresthesias [  ]                                       Seizures [  ]        Musculoskeletal :  Arthritis [  ] Gout  [  ]  Back pain [  ]  Joint pain [  ]        Skin :  Rash [  ]  Melanoma [  ] Sores [  ]        Heme : Bleeding problems [  ]Clotting Disorders [  ] Anemia [  ]Blood Transfusion [ ]         Endocrine : Diabetes [  ] Heat or Cold intolerance [  ] Polyuria [  ]excessive thirst [ ]         Psych : Depression [  yes]  Anxiety [ yes ]  Psych hospitalizations [  ] Memory change [  ]  BP (!) 155/84   Pulse 90   Resp 20   Ht 5\' 8"  (1.727  m)   Wt 155 lb (70.3 kg)   SpO2 97% Comment: RA  BMI 23.57 kg/m  Physical Exam      Exam    General- alert and comfortable   Lungs- clear without rales, wheezes. Well-healed sternal incision   Cor- regular rate and rhythm, 3/6 AS murmur ,  No gallop   Abdomen- soft, non-tender   Extremities - warm, non-tender, minimal edema. Surgical scars in both legs from saphenous vein harvesting   Neuro- oriented, appropriate, no focal weakness   Diagnostic Tests: Coronary arteriogram show severe disease-atretic native coronary vessels, patent left IMA to LAD, patent sequential vein graft to PDA-PL and patent vein graft to OM Aortogram shows moderate AI Echocardiogram shows calcified thickened aortic stenosis, moderate-severe aortic stenosis with EF 45%  Impression: The patient has severe aortic valve disease with dyspnea Third time sternotomy for AVR would be extremely high risk and could potentially injure vein grafts which are his only source of myocardial blood supply. There is no native vein left in his legs that could be used to repair a vein graft injured at the time of redo sternotomy  Plan: Recommend transcatheter aVR as his best long-term therapy.   Mikey Bussing, MD [facility] [phone]

## 2016-09-03 ENCOUNTER — Other Ambulatory Visit: Payer: Self-pay | Admitting: *Deleted

## 2016-09-03 DIAGNOSIS — I350 Nonrheumatic aortic (valve) stenosis: Secondary | ICD-10-CM

## 2016-09-03 LAB — T-HELPER CELL (CD4) - (RCID CLINIC ONLY)
CD4 % Helper T Cell: 39 % (ref 33–55)
CD4 T Cell Abs: 1330 /uL (ref 400–2700)

## 2016-09-04 LAB — HIV-1 RNA QUANT-NO REFLEX-BLD
HIV 1 RNA Quant: <20 copies/mL
HIV-1 RNA Quant, Log: <1.3 Log copies/mL

## 2016-09-09 ENCOUNTER — Other Ambulatory Visit: Payer: Self-pay | Admitting: Pharmacist Clinician (PhC)/ Clinical Pharmacy Specialist

## 2016-09-09 ENCOUNTER — Telehealth: Payer: Self-pay | Admitting: Pharmacist Clinician (PhC)/ Clinical Pharmacy Specialist

## 2016-09-09 ENCOUNTER — Ambulatory Visit: Payer: Medicare Other | Admitting: Internal Medicine

## 2016-09-09 NOTE — Telephone Encounter (Signed)
[name] Smith didn't know that he has an appt today with Dr. Drue SecondSnider. Made him another one.

## 2016-09-16 ENCOUNTER — Other Ambulatory Visit: Payer: Medicare Other

## 2016-09-17 MED FILL — BIKTARVY 50-200-25 MG TABS: 50-200-25 | 30 days supply | Qty: 30 | Fill #3

## 2016-09-18 ENCOUNTER — Ambulatory Visit (HOSPITAL_COMMUNITY)
Admission: RE | Admit: 2016-09-18 | Discharge: 2016-09-18 | Disposition: A | Discharge status: Home / Self Care | Payer: Medicare Other | Source: Ambulatory Visit | Attending: Cardiovascular Disease | Admitting: Cardiovascular Disease

## 2016-09-18 ENCOUNTER — Encounter (HOSPITAL_COMMUNITY): Payer: Self-pay

## 2016-09-18 DIAGNOSIS — I2510 Atherosclerotic heart disease of native coronary artery without angina pectoris: Secondary | ICD-10-CM | POA: Diagnosis not present

## 2016-09-18 DIAGNOSIS — K551 Chronic vascular disorders of intestine: Secondary | ICD-10-CM | POA: Insufficient documentation

## 2016-09-18 DIAGNOSIS — Z951 Presence of aortocoronary bypass graft: Secondary | ICD-10-CM | POA: Insufficient documentation

## 2016-09-18 DIAGNOSIS — I350 Nonrheumatic aortic (valve) stenosis: Secondary | ICD-10-CM

## 2016-09-18 DIAGNOSIS — J449 Chronic obstructive pulmonary disease, unspecified: Secondary | ICD-10-CM | POA: Insufficient documentation

## 2016-09-18 DIAGNOSIS — I358 Other nonrheumatic aortic valve disorders: Secondary | ICD-10-CM | POA: Diagnosis not present

## 2016-09-18 DIAGNOSIS — Z87891 Personal history of nicotine dependence: Secondary | ICD-10-CM | POA: Diagnosis not present

## 2016-09-18 DIAGNOSIS — I700 Atherosclerosis of aorta: Secondary | ICD-10-CM | POA: Diagnosis not present

## 2016-09-18 LAB — PULMONARY FUNCTION TEST
DL/VA % pred: 113 %
DL/VA: 5.09 ml/min/mmHg/L
DLCO unc % pred: 85 %
DLCO unc: 25.39 ml/min/mmHg
FEF 25-75 Post: 3.55 L/sec
FEF 25-75 Pre: 2.42 L/sec
FEF2575-%Change-Post: 46 %
FEF2575-%Pred-Post: 117 %
FEF2575-%Pred-Pre: 80 %
FEV1-%Change-Post: 9 %
FEV1-%Pred-Post: 79 %
FEV1-%Pred-Pre: 72 %
FEV1-Post: 2.79 L
FEV1-Pre: 2.55 L
FEV1FVC-%Change-Post: 2 %
FEV1FVC-%Pred-Pre: 104 %
FEV6-%Change-Post: 7 %
FEV6-%Pred-Post: 77 %
FEV6-%Pred-Pre: 72 %
FEV6-Post: 3.41 L
FEV6-Pre: 3.17 L
FEV6FVC-%Change-Post: 0 %
FEV6FVC-%Pred-Post: 103 %
FEV6FVC-%Pred-Pre: 103 %
FVC-%Change-Post: 7 %
FVC-%Pred-Post: 74 %
FVC-%Pred-Pre: 69 %
FVC-Post: 3.41 L
FVC-Pre: 3.19 L
Post FEV1/FVC ratio: 82 %
Post FEV6/FVC ratio: 100 %
Pre FEV1/FVC ratio: 80 %
Pre FEV6/FVC Ratio: 100 %
RV % pred: 96 %
RV: 1.97 L
TLC % pred: 79 %
TLC: 5.26 L

## 2016-09-18 MED ORDER — ALBUTEROL SULFATE (2.5 MG/3ML) 0.083% IN NEBU
2.5000 mg | INHALATION_SOLUTION | Freq: Once | RESPIRATORY_TRACT | Status: AC
Start: 2016-09-18 — End: 2016-09-18
  Administered 2016-09-18: 2.5 mg via RESPIRATORY_TRACT

## 2016-09-18 MED ORDER — IOPAMIDOL (ISOVUE-370) INJECTION 76%
100.0000 mL | Freq: Once | INTRAVENOUS | Status: AC | PRN
  Administered 2016-09-18: 95 mL via INTRAVENOUS

## 2016-09-22 ENCOUNTER — Institutional Professional Consult (permissible substitution) (INDEPENDENT_AMBULATORY_CARE_PROVIDER_SITE_OTHER): Payer: Medicare Other | Admitting: Thoracic Surgery (Cardiothoracic Vascular Surgery)

## 2016-09-22 ENCOUNTER — Encounter: Payer: Self-pay | Admitting: Thoracic Surgery (Cardiothoracic Vascular Surgery)

## 2016-09-22 ENCOUNTER — Ambulatory Visit: Payer: Medicare Other | Admitting: Physical Therapy

## 2016-09-22 VITALS — BP 120/71 | HR 72 | Resp 16 | Ht 68.0 in | Wt 155.0 lb

## 2016-09-22 DIAGNOSIS — I351 Nonrheumatic aortic (valve) insufficiency: Secondary | ICD-10-CM

## 2016-09-22 DIAGNOSIS — I350 Nonrheumatic aortic (valve) stenosis: Secondary | ICD-10-CM | POA: Diagnosis not present

## 2016-09-22 DIAGNOSIS — Z951 Presence of aortocoronary bypass graft: Secondary | ICD-10-CM | POA: Diagnosis not present

## 2016-09-22 DIAGNOSIS — I2510 Atherosclerotic heart disease of native coronary artery without angina pectoris: Secondary | ICD-10-CM | POA: Diagnosis not present

## 2016-09-22 NOTE — Patient Instructions (Signed)
Stop smoking immediately and permanently.  Increase frequency of follow up visits with Dr Eden EmmsNishan   Call and return if you develop chest tightness and/or shortness of breath with exertion

## 2016-09-22 NOTE — Progress Notes (Signed)
[facility]  MULTIDISCIPLINARY HEART VALVE CLINIC  CARDIOTHORACIC SURGERY CONSULTATION REPORT  Referring Provider is Wendall Stade, MD PCP is Lupita Raider, MD  Chief Complaint  Patient presents with  . Aortic Stenosis    2ND TAVR EVAL    HPI:  Patient is a 56 year old male with history of coronary artery disease status post coronary artery bypass grafting 4 in 2001 and status post redo coronary artery bypass grafting 4 in 2011, aortic stenosis, hypertension, hypercholesterolemia, long-standing tobacco abuse, and HIV who has been referred for second surgical consultation to discuss treatment options for management of severe aortic stenosis. The patient's cardiac history dates back to 2001 when he originally presented with an acute myocardial infarction. He underwent coronary artery bypass grafting 4 by Dr. Donata Clay with grafts placed at the time of surgery including left internal mammary artery to the distal left anterior descending coronary artery, saphenous vein graft to the diagonal branch, saphenous vein graft to the ramus intermediate branch, and saphenous vein graft to the left circumflex coronary artery.  He did well for many years but eventually developed recurrent angina and underwent redo coronary artery bypass grafting 4 by Dr. Donata Clay in 2011.  Grafts placed at that time included a sequential saphenous vein graft to the posterior descending coronary artery and the right posterolateral branch, redo saphenous vein graft to the ramus intermediate branch, and saphenous vein graft to terminal branches of the left anterior descending coronary artery. He did well following this surgery although he states that he has remained chronically fatigued ever since. He has been followed carefully for many years by Dr. Eden Emms. He developed a heart murmur on physical exam and echocardiograms have demonstrated the progression of severity of aortic stenosis. Recent transthoracic  echocardiogram performed 08/05/2016 revealed further progression of disease with what was felt to be moderate to severe aortic stenosis and moderate aortic insufficiency. Peak velocity across the aortic valve was reported 3.8 m/s corresponding to mean transvalvular gradient estimated 44 mmHg. Left ventricular systolic function was moderately reduced with ejection fraction estimated 45-50%.  The severity of aortic stenosis had increased in comparison with an echocardiogram performed point year previously, and the left ventricular ejection fraction had dropped slightly.  The patient underwent left and right heart catheterization by Dr. Excell Seltzer on 08/12/2016. This confirmed the presence of known severe native coronary artery disease with severe stenosis of the left main coronary artery and chronic total occlusion of the left anterior descending coronary artery, the left circumflex coronary artery, and the right coronary artery. There was continued patency of most of the previously placed bypass grafts including patent left internal mammary artery to the left anterior descending coronary artery, saphenous vein graft to the obtuse marginal branch coronary artery, and sequential saphenous vein graft to the posterior descending and right posterior lateral branch. The remaining old vein grafts were chronically occluded. There was mixed aortic valve disease with mean transvalvular gradient across the aortic valve measured 20 mmHg. There was moderate (3+) aortic insufficiency.  Pulmonary artery pressures were normal. The patient was referred for surgical consultation and evaluated by Dr. Donata Clay felt the patient would be relatively poor candidate for conventional surgical aortic valve replacement via a third time redo sternotomy. CT angiography has been performed to evaluate the feasibility of transcatheter aortic valve replacement and a second surgical opinion has been requested.  The patient is single and lives alone  locally in [place]. 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 He reports that he remains physically active. He exercises at a local gym several times a week and still rides a stationary bicycle for 20 minutes at a time without problems. He admits that he does not push himself physically and he does not do any sort of strenuous exercise, both with ordinary activities and low level exercise he experiences no symptoms of exertional shortness of breath or chest tightness. He complains of chronic fatigue that dates back to his last heart surgery in 2011. He states that this has not changed in his exercise tolerance is not dropped recently. He specifically denies any history of PND, orthopnea, or lower extremity edema. He has not had dizzy spells or syncope.    Past Medical History:  Diagnosis Date  . Anxiety state, unspecified   . Calculus of kidney   . Coronary atherosclerosis of unspecified type of vessel, native or graft   . Esophageal reflux   . Human immunodeficiency virus (HIV) disease (HCC)   . Hypertension   . Need for prophylactic vaccination and inoculation against influenza   . Pure hypercholesterolemia   . S/P CABG (coronary artery bypass graft)    2001 - LIMA to LAD, SVG to Diag, SVG to ramus, SVG to LCx  . S/P redo CABG x 4 12/28/2009   Sequential SVG to PDA-RPL, SVG to ramus, SVG to dLAD     Past Surgical History:  Procedure Laterality Date  . CORONARY ARTERY BYPASS GRAFT  12/1999, 12/2009   2001 - 4V, 2011 - 4V  . PTCA  2004   1 stent placed  . RIGHT/LEFT HEART CATH AND CORONARY ANGIOGRAPHY N/A 08/12/2016   Procedure: Right/Left Heart Cath and Coronary Angiography;  Surgeon: Tonny Bollman, MD;  Location: [facility] INVASIVE CV LAB;  Service: Cardiovascular;  Laterality: N/A;    Family History  Problem Relation Age of Onset  . Arthritis Mother     Social History   Social History  . Marital status: Single     Spouse name: N/A  . Number of children: N/A  . Years of education: N/A   Occupational History  . Not on file.   Social History Main Topics  . Smoking status: Former Smoker    Packs/day: 1.00    Years: 36.00    Types: Cigarettes    Quit date: 12/12/2013  . Smokeless tobacco: Never Used  . Alcohol use 4.2 oz/week    7 Standard drinks or equivalent per week     Comment: wine  . Drug use: No  . Sexual activity: Not Currently    Partners: Male     Comment: accepted condoms   Other Topics Concern  . Not on file   Social History Narrative  . No narrative on file    Current Outpatient Prescriptions  Medication Sig Dispense Refill  . albuterol (PROVENTIL HFA;VENTOLIN HFA) 108 (90 Base) MCG/ACT inhaler Inhale 2 puffs into the lungs every 6 (six) hours as needed for wheezing or shortness of breath. 1 Inhaler 6  . ARIPiprazole (ABILIFY) 15 MG tablet TAKE 1 TABLET BY MOUTH DAILY. 30 tablet 5  . aspirin EC 81 MG tablet Take 1 tablet (81 mg total) by mouth daily. 90 tablet 3  . bictegravir-emtricitabine-tenofovir AF (BIKTARVY) 50-200-25 MG TABS tablet Take 1 tablet by mouth daily.    Marland Kitchen buPROPion (WELLBUTRIN) 100 MG tablet Take 1 tablet (100 mg total) by mouth daily. 30 tablet 3  . clopidogrel (PLAVIX) 75 MG tablet Take 1 tablet (  75 mg total) by mouth daily. 30 tablet 10  . fluticasone (FLONASE) 50 MCG/ACT nasal spray Place 1 spray into both nostrils daily.    Marland Kitchen ibuprofen (ADVIL,MOTRIN) 600 MG tablet Take 1 tablet (600 mg total) by mouth every 6 (six) hours as needed for pain. 30 tablet 0  . loratadine (CLARITIN) 10 MG tablet Take 1 tablet (10 mg total) by mouth daily. 30 tablet 6  . metoprolol succinate (TOPROL-XL) 50 MG 24 hr tablet TAKE 1 TABLET BY MOUTH DAILY. TAKE WITH OR IMMEDIATELY FOLLOWING A MEAL. 90 tablet 1  . Multiple Vitamin (MULTIVITAMIN WITH MINERALS) TABS tablet Take 1 tablet by mouth daily.    . nitroGLYCERIN (NITROSTAT) 0.4 MG SL tablet Place 1 tablet (0.4 mg total) under  the tongue every 5 (five) minutes as needed. For chest pain -- may repeat times three 25 tablet 11  . ranitidine (ZANTAC) 150 MG tablet TAKE 1 TABLET BY MOUTH TWICE A DAY 60 tablet 0  . rosuvastatin (CRESTOR) 20 MG tablet TAKE 1 TABLET (20 MG TOTAL) BY MOUTH DAILY. 30 tablet 5  . sildenafil (VIAGRA) 50 MG tablet Take 50 mg by mouth daily as needed for erectile dysfunction.    . sucralfate (CARAFATE) 1 GM/10ML suspension Take 10 mLs (1 g total) by mouth 4 (four) times daily -  with meals and at bedtime. 420 mL 0  . tiotropium (SPIRIVA) 18 MCG inhalation capsule Place 18 mcg into inhaler and inhale daily.    . traMADol (ULTRAM) 50 MG tablet TAKE 1 TABLET BY MOUTH EVERY 6 HOURS AS NEEDED FOR MODERATE TO SEVERE PAIN 30 tablet 2  . triamcinolone cream (KENALOG) 0.1 % Apply topically 2 (two) times daily. (Patient taking differently: Apply 1 application topically 2 (two) times daily as needed (rash). ) 30 g 1  . umeclidinium bromide (INCRUSE ELLIPTA) 62.5 MCG/INH AEPB Inhale 1 puff into the lungs daily. (Patient taking differently: Inhale 2 puffs into the lungs daily as needed (breathing). ) 1 each 5  . valACYclovir (VALTREX) 1000 MG tablet Take 1 tablet (1,000 mg total) by mouth 3 (three) times daily. (Patient taking differently: Take 500 mg by mouth 3 (three) times daily as needed (fever blisters). ) 21 tablet 3   No current facility-administered medications for this visit.     Allergies  Allergen Reactions  . Azithromycin Rash      Review of Systems:   General:  normal appetite, decreased energy, no weight gain, no weight loss, no fever  Cardiac:  no chest pain with exertion, no chest pain at rest, no SOB with exertion, no resting SOB, no PND, no orthopnea, no palpitations, no arrhythmia, no atrial fibrillation, no LE edema, no dizzy spells, no syncope  Respiratory:  no shortness of breath, no home oxygen, no productive cough, no dry cough, + occasional bronchitis, no wheezing, no hemoptysis, no  asthma, no pain with inspiration or cough, no sleep apnea, no CPAP at night  GI:   no difficulty swallowing, + reflux, no frequent heartburn, no hiatal hernia, no abdominal pain, no constipation, no diarrhea, no hematochezia, no hematemesis, no melena  GU:   no dysuria,  no frequency, no urinary tract infection, no hematuria, no enlarged prostate, no kidney stones, no kidney disease  Vascular:  no pain suggestive of claudication, no pain in feet, occasional leg cramps, no varicose veins, no DVT, no non-healing foot ulcer  Neuro:   no stroke, no TIA's, no seizures, no headaches, no temporary blindness one eye,  no slurred  speech, no peripheral neuropathy, no chronic pain, no instability of gait, no memory/cognitive dysfunction  Musculoskeletal: mild arthritis, no joint swelling, no myalgias, no difficulty walking, normal mobility   Skin:   no rash, no itching, no skin infections, no pressure sores or ulcerations  Psych:   no anxiety, no depression, no nervousness, no unusual recent stress  Eyes:   + blurry vision, no floaters, no recent vision changes, does not wears glasses or contacts  ENT:   no hearing loss, no loose or painful teeth, no dentures, last saw dentist ?  Hematologic:  + easy bruising, no abnormal bleeding, no clotting disorder, no frequent epistaxis  Endocrine:  no diabetes, does not check CBG's at home           Physical Exam:   BP 120/71 (BP Location: Left Arm, Patient Position: Sitting, Cuff Size: Large)   Pulse 72   Resp 16   Ht 5\' 8"  (1.727 m)   Wt 155 lb (70.3 kg)   SpO2 93% Comment: ON RA  BMI 23.57 kg/m   General:  Thin,  well-appearing  HEENT:  Unremarkable   Neck:   no JVD, no bruits, no adenopathy   Chest:   clear to auscultation, symmetrical breath sounds, no wheezes, no rhonchi   CV:   RRR, grade IV/VI crescendo/decrescendo murmur heard best at RUSB,  no diastolic murmur  Abdomen:  soft, non-tender, no masses   Extremities:  warm, well-perfused, pulses  diminished but palpable, no LE edema  Rectal/GU  Deferred  Neuro:   Grossly non-focal and symmetrical throughout  Skin:   Clean and dry, no rashes, no breakdown   Diagnostic Tests:  Echocardiography  (Report amended )  Patient:    Dj, Senteno MR #:       [id number] Study Date: 08/05/2016 Gender:     M Age:        55 Height:     175.3 cm Weight:     72.6 kg BSA:        1.89 m^2 Pt. Status: Room:   Layla Maw, M.D.  REFERRING    Charlton Haws, M.D.  REFERRING    Lupita Raider [id number]  SONOGRAPHER  Aida Raider, RDCS  ATTENDING    Chilton Si, MD  PERFORMING   [facility]  cc:  ------------------------------------------------------------------- LV EF: 45% -   50%  ------------------------------------------------------------------- Indications:      I35.9 Aortic Valve Disorder.  ------------------------------------------------------------------- History:   PMH:  Acquired from the patient and from the patient&'s chart.  PMH:  Murmur.  Risk factors:  Current tobacco use. Hypertension. Dyslipidemia.  ------------------------------------------------------------------- Study Conclusions  - Left ventricle: The cavity size was normal. Systolic function was   mildly reduced. The estimated ejection fraction was in the range   of 45% to 50%. Diffuse hypokinesis. The study is not technically   sufficient to allow evaluation of LV diastolic function. Doppler   parameters are consistent with indeterminate ventricular filling   pressure. - Aortic valve: Valve mobility was restricted. There was moderate   to severe stenosis. There was moderate regurgitation. - Aorta: Ascending aortic diameter: 39 mm (S). - Ascending aorta: The ascending aorta was mildly dilated. - Mitral valve: There was no regurgitation. - Right ventricle: The cavity size was normal. Wall thickness was   normal. Systolic function was normal. - Tricuspid valve:  Transvalvular velocity was within the normal   range. There was trivial regurgitation.  ------------------------------------------------------------------- Study data:  Study status:  Routine.  Procedure:  The patient reported no pain pre or post test. Transthoracic echocardiography for left ventricular function evaluation, for right ventricular function evaluation, and for assessment of valvular function. Image quality was adequate.  Study completion:  There were no complications.          Echocardiography.  M-mode, complete 2D, spectral Doppler, and color Doppler.  Birthdate:  Patient birthdate: 04-29-1960.  Age:  Patient is 56 yr old.  Sex:  Gender: male.    BMI: 23.6 kg/m^2.  Blood pressure:     146/80  Patient status:  Outpatient.  Study date:  Study date: 08/05/2016. Study time: 10:47 AM.  Location:  [facility]  -------------------------------------------------------------------  ------------------------------------------------------------------- Left ventricle:  The cavity size was normal. Systolic function was mildly reduced. The estimated ejection fraction was in the range of 45% to 50%. Diffuse hypokinesis. The transmitral flow pattern was normal. The deceleration time of the early transmitral flow velocity was normal. The pulmonary vein flow pattern was normal. The tissue Doppler parameters were normal. The study is not technically sufficient to allow evaluation of LV diastolic function. Doppler parameters are consistent with indeterminate ventricular filling pressure.  ------------------------------------------------------------------- Aortic valve:   Trileaflet; mildly thickened, moderately calcified leaflets. Valve mobility was restricted.  Doppler:   There was moderate to severe stenosis.   There was moderate regurgitation. VTI ratio of LVOT to aortic valve: 0.24. Valve area (VTI): 0.77 cm^2. Indexed valve area (VTI): 0.41 cm^2/m^2. Peak velocity ratio of  LVOT to aortic valve: 0.23. Valve area (Vmax): 0.73 cm^2. Indexed valve area (Vmax): 0.39 cm^2/m^2. Mean velocity ratio of LVOT to aortic valve: 0.2. Valve area (Vmean): 0.63 cm^2. Indexed valve area (Vmean): 0.34 cm^2/m^2.    Mean gradient (S): 44 mm Hg. Peak gradient (S): 57 mm Hg.  ------------------------------------------------------------------- Aorta:  Aortic root: The aortic root was normal in size. Ascending aorta: The ascending aorta was mildly dilated.  ------------------------------------------------------------------- Mitral valve:   Structurally normal valve.   Mobility was not restricted.  Doppler:  Transvalvular velocity was within the normal range. There was no evidence for stenosis. There was no regurgitation.    Peak gradient (D): 4 mm Hg.  ------------------------------------------------------------------- Left atrium:  The atrium was normal in size.  ------------------------------------------------------------------- Right ventricle:  The cavity size was normal. Wall thickness was normal. Systolic function was normal.  ------------------------------------------------------------------- Pulmonic valve:    Doppler:  Transvalvular velocity was within the normal range. There was no evidence for stenosis.  ------------------------------------------------------------------- Tricuspid valve:   Structurally normal valve.    Doppler: Transvalvular velocity was within the normal range. There was trivial regurgitation.  ------------------------------------------------------------------- Pulmonary artery:   The main pulmonary artery was normal-sized. Systolic pressure could not be accurately estimated.  ------------------------------------------------------------------- Right atrium:  The atrium was normal in size.  ------------------------------------------------------------------- Pericardium:  There was no pericardial  effusion.  ------------------------------------------------------------------- Systemic veins: Inferior vena cava: The vessel was normal in size. The respirophasic diameter changes were in the normal range (= 50%), consistent with normal central venous pressure.  ------------------------------------------------------------------- Measurements   Left ventricle                            Value          Reference  LV ID, ED, PLAX chordal                   51    mm  43 - 52  LV ID, ES, PLAX chordal           (H)     41.3  mm       23 - 38  LV fx shortening, PLAX chordal    (L)     19    %        >=29  LV PW thickness, ED                       8.17  mm       ---------  IVS/LV PW ratio, ED                       1.09           <=1.3  Stroke volume, 2D                         60    ml       ---------  Stroke volume/bsa, 2D                     32    ml/m^2   ---------  LV e&', lateral                            8.99  cm/s     ---------  LV E/e&', lateral                          10.92          ---------  LV e&', medial                             7.57  cm/s     ---------  LV E/e&', medial                           12.97          ---------  LV e&', average                            8.28  cm/s     ---------  LV E/e&', average                          11.86          ---------    Ventricular septum                        Value          Reference  IVS thickness, ED                         8.87  mm       ---------    LVOT                                      Value          Reference  LVOT ID, S  20    mm       ---------  LVOT area                                 3.14  cm^2     ---------  LVOT ID                                   20    mm       ---------  LVOT peak velocity, S                     87.7  cm/s     ---------  LVOT mean velocity, S                     65.7  cm/s     ---------  LVOT VTI, S                               19    cm       ---------   LVOT peak gradient, S                     3     mm Hg    ---------  Stroke volume (SV), LVOT DP               59.7  ml       ---------  Stroke index (SV/bsa), LVOT DP            31.7  ml/m^2   ---------    Aortic valve                              Value          Reference  Aortic valve peak velocity, S             378   cm/s     ---------  Aortic valve mean velocity, S             325   cm/s     ---------  Aortic valve VTI, S                       77.6  cm       ---------  Aortic mean gradient, S                   44    mm Hg    ---------  Aortic peak gradient, S                   57    mm Hg    ---------  VTI ratio, LVOT/AV                        0.24           ---------  Aortic valve area, VTI                    0.77  cm^2     ---------  Aortic valve area/bsa, VTI                0.41  cm^2/m^2 ---------  Velocity ratio, peak, LVOT/AV             0.23           ---------  Aortic valve area, peak velocity          0.73  cm^2     ---------  Aortic valve area/bsa, peak               0.39  cm^2/m^2 ---------  velocity  Velocity ratio, mean, LVOT/AV             0.2            ---------  Aortic valve area, mean velocity          0.63  cm^2     ---------  Aortic valve area/bsa, mean               0.34  cm^2/m^2 ---------  velocity  Aortic regurg pressure half-time          239   ms       ---------    Aorta                                     Value          Reference  Aortic root ID, ED                        32    mm       ---------  Ascending aorta ID, A-P, S                39    mm       ---------    Left atrium                               Value          Reference  LA ID, A-P, ES                            46    mm       ---------  LA ID/bsa, A-P                    (H)     2.44  cm/m^2   <=2.2  LA volume, S                              49    ml       ---------  LA volume/bsa, S                          26    ml/m^2   ---------  LA volume, ES, 1-p A4C                    40    ml        ---------  LA volume/bsa, ES, 1-p A4C                21.2  ml/m^2   ---------  LA volume, ES, 1-p A2C  56    ml       ---------  LA volume/bsa, ES, 1-p A2C                29.7  ml/m^2   ---------    Mitral valve                              Value          Reference  Mitral E-wave peak velocity               98.2  cm/s     ---------  Mitral A-wave peak velocity               75.5  cm/s     ---------  Mitral deceleration time                  190   ms       150 - 230  Mitral peak gradient, D                   4     mm Hg    ---------  Mitral E/A ratio, peak                    1.3            ---------    Systemic veins                            Value          Reference  Estimated CVP                             3     mm Hg    ---------    Right ventricle                           Value          Reference  RV s&', lateral, S                         9.4   cm/s     ---------  Legend: (L)  and  (H)  mark values outside specified reference range.  ------------------------------------------------------------------- Hart Carwin, MD [id number]:44:42   Right/Left Heart Cath and Coronary Angiography  Conclusion   1. Severe native coronary artery disease with severe stenosis of the left main and total occlusion of the LAD, left circumflex, and right coronary arteries 2. Status post redo CABG with continued patency of the LIMA to LAD, saphenous vein graft OM, and sequential saphenous vein graft to PDA and PLA branches. All other saphenous vein grafts are occluded. 3. Mixed aortic valve disease with aortic stenosis and 3+ insufficiency 4. Normal right heart hemodynamics  Recommendations: Will review case with Dr. Eden Emms. The patient's aortic valve gradients are less by cardiac catheterization than that of his echo study. However, his aortic insufficiency is more impressive by aortic root angiography. I suspect he has mixed disease with an increase in his  gradient related to flow. His aortic valve area is overestimated by this study because of a high cardiac output based on Fick calculation.   Indications   Severe aortic stenosis [I35.0 (ICD-10-CM)]  Procedural Details/Technique  Technical Details INDICATION: 56 yo male with hx of redo CABG who has developed progressive aortic valve disease and is referred for right and left heart catheterization by Dr Cathleen Fears DETAILS: The right groin was prepped, draped, and anesthetized with 1% lidocaine. Using the modified Seldinger technique a 5 French sheath was placed in the right femoral artery and a 7 French sheath was placed in the right femoral vein. A Swan-Ganz catheter was used for the right heart catheterization. Standard protocol was followed for recording of right heart pressures and sampling of oxygen saturations. Fick cardiac output was calculated. Standard Judkins catheters were used for selective coronary angiography, aortic root angiography, and left ventriculography. The aortic valve is crossed with an AL-1 catheter and straight wire. There were no immediate procedural complications. The patient was transferred to the post catheterization recovery area for further monitoring.    Estimated blood loss <50 mL.  During this procedure the patient was administered the following to achieve and maintain moderate conscious sedation: Versed 4 mg, Fentanyl 50 mcg, while the patient's heart rate, blood pressure, and oxygen saturation were continuously monitored. The period of conscious sedation was 54 minutes, of which I was present face-to-face 100% of this time.    Coronary Findings   Dominance: Right  Left Main  Ost LM to LM lesion, 95% stenosed. severely diffusely diseased left main  Left Anterior Descending  Prox LAD to Mid LAD lesion, 100% stenosed.  Left Circumflex  Prox Cx lesion, 100% stenosed.  Mid Cx lesion, 100% stenosed.  Right Coronary Artery  Prox RCA to Mid RCA lesion,  100% stenosed. The lesion was previously treatedover 2 years ago.  Graft Angiography  Sequential jump graft Graft to Dist RCA, RPDA  Seq SVG- Right PDA and PLA graft was visualized by angiography. The SVG sequential to the PDA and PLA is widely patent without stenosis  Free LIMA Graft to Dist LAD  LIMA graft was visualized by angiography and is normal in caliber and anatomically normal. the LIMA-LAD is widely patent and the LAD beyond the LIMA insertion site is patent (improved in appearance from the previous cath study in 2011)  saphenous Graft to 1st Mrg  SVG graft was visualized by angiography and is normal in caliber. The SVG-OM is patent without stenosis  Wall Motion              Left Heart   Left Ventricle The left ventricular size is normal. There is mild left ventricular systolic dysfunction. LV end diastolic pressure is normal. The left ventricular ejection fraction is 50-55% by visual estimate. There is hypokinesis of the basal inferior wall. The LVEF is 50-55% by visual estimate.    Aortic Valve There is moderate (3+) aortic regurgitation. The aortic valve is calcified. There is restricted aortic valve motion. The aortic valve is calcified with restricted leaflet mobility. There is 3+ AI.  The mean aortic valve gradient is 20 mmHg. The calculated aortic valve area is 3.16 square cm    Coronary Diagrams   Diagnostic Diagram       Implants     No implant documentation for this case.  PACS Images   Show images for Cardiac catheterization   Link to Procedure Log   Procedure Log    Hemo Data    Most Recent Value  Fick Cardiac Output 8.87 L/min  Fick Cardiac Output Index 4.79 (L/min)/BSA  Aortic Mean Gradient 19.7 mmHg  Aortic Peak Gradient 12 mmHg  Aortic Valve Area 3.16  Aortic Value Area Index 1.71 cm2/BSA  RA A Wave 4 mmHg  RA V Wave 3 mmHg  RA Mean 1 mmHg  RV Systolic Pressure 27 mmHg  RV Diastolic Pressure 0 mmHg  RV EDP 3 mmHg  PA Systolic Pressure  27 mmHg  PA Diastolic Pressure 5 mmHg  PA Mean 14 mmHg  PW A Wave 8 mmHg  PW V Wave 8 mmHg  PW Mean 7 mmHg  AO Systolic Pressure 103 mmHg  AO Diastolic Pressure 56 mmHg  AO Mean 76 mmHg  LV Systolic Pressure 131 mmHg  LV Diastolic Pressure 0 mmHg  LV EDP 8 mmHg  Arterial Occlusion Pressure Extended Systolic Pressure 111 mmHg  Arterial Occlusion Pressure Extended Diastolic Pressure 46 mmHg  Arterial Occlusion Pressure Extended Mean Pressure 75 mmHg  Left Ventricular Apex Extended Systolic Pressure 123 mmHg  Left Ventricular Apex Extended Diastolic Pressure 0 mmHg  Left Ventricular Apex Extended EDP Pressure 8 mmHg  QP/QS 1  TPVR Index 2.92 HRUI  TSVR Index 15.85 HRUI  PVR SVR Ratio 0.09  TPVR/TSVR Ratio 0.18        Cardiac TAVR CT  TECHNIQUE: The patient was scanned on a Siemens [id number] scanner. A 80 kV retrospective scan was triggered in the ascending thoracic aorta at 140 HU's. Gantry rotation speed was 250 msecs and collimation was .6 mm. No beta blockade or nitro were given. The 3D data set was reconstructed in 5% intervals of the R-R cycle. Systolic and diastolic phases were analyzed on a dedicated work station using MPR, MIP and VRT modes. The patient received 80 cc of contrast.  FINDINGS: Aortic Valve: Tri- leaflet with restricted motion and calcified. No significant annular calcification  Aorta:  No aneurysm normal origin of great vessel  Sinotubular Junction:  24 mm  Ascending Thoracic Aorta:  30 mm  Aortic Arch:  30 mm  Descending Thoracic Aorta:  23 mm  Sinus of Valsalva Measurements:  Non-coronary:  28 mm  Right -coronary:  26.5 mm  Left -coronary:  27.5 mm  Coronary Artery Height above Annulus:  Left Main:  11.5 mm above annulus  Right Coronary:  12.8 mm above annulus  Coronary Arteries: Native arteries occluded. Patent LIMA to LAD. Patent SVG to PDA and patent SVG to OM  Virtual Basal Annulus  Measurements:  Maximum/Minimum Diameter:  25.2 mm x 21.1 mm  Perimeter:  74 mm  Area:  418 mm2  Optimum Fluoroscopic Angle for Delivery: LAO 19 degrees Cranial 6 degrees  IMPRESSION: 1) Calcified tri leaflet aortic valve with annulus suitable for a 23 mm Sapien 3 valve  2) Occluded native coronary arteries with patent LIMA to LAD, Patent SVG to OM and patent SVG to PDA native ostium sufficient height above annulus but occluded arteries  3) Optimum Angiographic Angle for deployment LAO 19 degrees Cranial 6 degrees  4) Normal aortic root with no aneurysm  Charlton Haws   Electronically Signed   By: Charlton Haws M.D.   On: 09/18/2016 16:56    CT ANGIOGRAPHY CHEST, ABDOMEN AND PELVIS  TECHNIQUE: Multidetector CT imaging through the chest, abdomen and pelvis was performed using the standard protocol during bolus administration of intravenous contrast. Multiplanar reconstructed images and MIPs were obtained and reviewed to evaluate the vascular anatomy.  CONTRAST:  95 mL of Isovue 370.  COMPARISON:  Chest CT 12/22/2009. CT the abdomen and pelvis 07/21/2006.  FINDINGS: CTA CHEST FINDINGS  Cardiovascular: Heart size is mildly enlarged. There is no significant pericardial fluid, thickening or  pericardial calcification. There is aortic atherosclerosis, as well as atherosclerosis of the great vessels of the mediastinum and the coronary arteries, including calcified atherosclerotic plaque in the left main, left anterior descending, left circumflex and right coronary arteries. Status post median sternotomy for CABG including LIMA to the LAD. Severe thickening and calcification of the aortic valve.  Mediastinum/Lymph Nodes: No pathologically enlarged mediastinal or hilar lymph nodes. Esophagus is unremarkable in appearance. No axillary lymphadenopathy.  Lungs/Pleura: No acute consolidative airspace disease. No pleural effusions. 4 mm right upper  lobe pulmonary nodule (axial image 26 of series 7) similar in retrospect to prior study 12/22/2009, considered benign. No other suspicious appearing pulmonary nodules or masses are noted.  Musculoskeletal/Soft Tissues: Median sternotomy wires. There are no aggressive appearing lytic or blastic lesions noted in the visualized portions of the skeleton.  CTA ABDOMEN AND PELVIS FINDINGS  Hepatobiliary: No suspicious-appearing cystic or solid hepatic lesions. No intra or extrahepatic biliary ductal dilatation. Gallbladder is normal in appearance.  Pancreas: No pancreatic mass. No pancreatic ductal dilatation. No pancreatic or peripancreatic fluid or inflammatory changes.  Spleen: Unremarkable.  Adrenals/Urinary Tract: Several tiny subcentimeter low-attenuation lesions in the right kidney are too small to definitively characterize, but are statistically likely to represent cysts. Left kidney is normal in appearance. Bilateral adrenal glands are normal in appearance. No hydroureteronephrosis. Urinary bladder is normal in appearance.  Stomach/Bowel: Normal appearance of the stomach. No pathologic dilatation of small bowel or colon. Normal appendix.  Vascular/Lymphatic: Extensive atherosclerosis throughout the abdominal and pelvic vasculature, with vascular findings and measurements pertinent to potential transcatheter aortic valve replacement, as detailed below. No aneurysm or dissection noted in the abdominal or pelvic vasculature. The celiac axis and inferior mesenteric artery are widely patent without hemodynamically significant stenosis. There is moderate stenosis at the ostium of the superior mesenteric artery with some mild poststenotic dilatation (8 mm) of the proximal superior mesenteric artery. Two right renal arteries and one left renal artery are patent without definite hemodynamically significant stenosis. No lymphadenopathy noted in the abdomen or  pelvis.  Reproductive: Prostate gland and seminal vesicles are unremarkable in appearance.  Other: No significant volume of ascites.  No pneumoperitoneum.  Musculoskeletal: There are no aggressive appearing lytic or blastic lesions noted in the visualized portions of the skeleton.  VASCULAR MEASUREMENTS PERTINENT TO TAVR:  AORTA:  Minimal Aortic Diameter -  14 x 13 mm  Severity of Aortic Calcification -  severe  RIGHT PELVIS:  Right Common Iliac Artery -  Minimal Diameter - 6.1 x 5.1 mm  Tortuosity - mild  Calcification - severe  Right External Iliac Artery -  Minimal Diameter - 5.5 x 4.4 mm  Tortuosity - mild  Calcification - moderate  Right Common Femoral Artery -  Minimal Diameter - 6.3 x 6.3 mm  Tortuosity - mild  Calcification - moderate to severe  LEFT PELVIS:  Left Common Iliac Artery -  Minimal Diameter - 6.0 x 5.6 mm  Tortuosity - mild  Calcification - severe  Left External Iliac Artery -  Minimal Diameter - 5.9 x 3.9 mm  Tortuosity - mild  Calcification - moderate  Left Common Femoral Artery -  Minimal Diameter - 6.0 x 4.1 mm  Tortuosity - mild  Calcification - moderate to severe  Review of the MIP images confirms the above findings.  IMPRESSION: 1. Vascular findings and measurements pertinent to potential TAVR procedure. This patient does not appear to have suitable pelvic arterial access on either side. 2. Severe thickening calcification  of the aortic valve, compatible with the reported clinical history of severe aortic stenosis. 3. Aortic atherosclerosis, in addition to left main and 3 vessel coronary artery disease. Status post median sternotomy for CABG including LIMA to the LAD. 4. Moderate stenosis at the origin of the superior mesenteric artery. 5. Additional incidental findings, as above.   Electronically Signed   By: Trudie Reed M.D.   On: 09/18/2016 16:34    STS  Risk Calculator  Procedure    Redo sternotomy for AVR  Risk of Mortality   4.8% Morbidity or Mortality  24.8% Prolonged LOS   9.1% Short LOS    30.2% Permanent Stroke   1.8% Prolonged Vent Support  13.0% DSW Infection    0.4% Renal Failure    4.5% Reoperation    10.0%    Impression:  Patient has stage C2 severe asymptomatic aortic stenosis and moderate aortic insufficiency with history of coronary artery disease and vein graft disease status post coronary artery bypass grafting in 2001 and redo coronary artery bypass grafting in 2011.  He currently denies any significant symptoms of exertional shortness of breath or chest discomfort. He admits that he has some degree of chronic fatigue which dates back to 2011.  He states that his fatigue has not gotten worse recently. He exercises on a regular basis and can ride a stationary bicycle for up to 20 minutes, although he admits that he does not push himself very hard when he is exercising. I have personally reviewed the patient's recent transthoracic echocardiogram, diagnostic cardiac catheterization, and CT angiograms.  Transthoracic echocardiogram reveals the presence of a trileaflet aortic valve with moderate thickening, restricted leaflet mobility, and calcification involving all 3 leaflets. Peak velocity across the aortic valve measured 3.8 m/s corresponding to mean transvalvular gradient estimated 44 mmHg. At catheterization the transvalvular gradient was noticeably less and the calculated valve area was relatively high, likely reflecting significant high flow because of at least moderate (3+) aortic insufficiency. Most concerning is the fact that the patient's left ventricular ejection fraction has appeared to drop over the past year to 45-50%. Under the circumstances it is very reasonable to consider elective aortic valve replacement, but risks associated with conventional surgery via third time median sternotomy would be very high.   Diagnostic cardiac catheterization reveals continued patency of previous bypass grafts including left internal mammary artery to distal left anterior descending coronary artery, saphenous vein graft to the intermediate branch, and sequential saphenous vein graft to the posterior descending and right posterolateral branch. Cardiac gated CT angiogram of the heart confirms the presence of findings consistent with severe aortic stenosis and features suitable for transcatheter aortic valve replacement without any significant limitations. CT angiography of the aorta and iliac vessels demonstrates the presence of significant atherosclerotic occlusive disease in the aorta and iliac vessels. The patient has what appears to be very borderline femoral vascular access to facilitate transfemoral approach, and it is possible that alternative access might be required. There is significant atherosclerotic plaque at the origin of left common carotid artery. The left subclavian artery appears free of significant disease but gives rise to the patient's patent left internal mammary artery graft.  Transapical approach could likely be utilized if necessary.     Plan:  The patient was counseled at length regarding treatment alternatives for management of severe aortic stenosis.  The relative indications associated with surgery for severe aortic stenosis in the absence of significant symptoms were discussed, particularly with regards to concerns of decreased left  ventricular systolic function. Alternative approaches such as conventional aortic valve replacement, transcatheter aortic valve replacement, and long term medical therapy were compared and contrasted at length.  The risks associated with conventional surgical aortic valve replacement were been discussed in detail, as were expectations for post-operative convalescence, and why I would be reluctant to consider this patient a candidate for conventional surgery.  Issues specific  to transcatheter aortic valve replacement were discussed including questions about long term valve durability, the potential for paravalvular leak, possible increased risk of need for permanent pacemaker placement, and other technical complications related to the procedure itself.  Long-term prognosis with medical therapy was discussed. This discussion was placed in the context of the patient's own specific clinical presentation and past medical history, most notably concerns regarding long-term durability of valve prosthesis following TAVR.  At this point the patient remains very reluctant to consider proceeding with any type of surgical intervention, and he specifically would not be interested in conventional surgical repair valve replacement via third time redo sternotomy. Because he remains essentially asymptomatic, it might be reasonable to continue to follow him very closely for a period of time rather than proceeding with TAVR in the near future. However, I am concerned about the degree of left ventricular systolic dysfunction appreciated on his most recent echocardiogram. Exercise treadmill stress testing could be considered to confirm whether or not the patient really is asymptomatic, but with or without stress testing he will need to be followed closely. At the very least I would recommend follow-up echocardiograms every 6 months.  The patient will contact Dr. Eden Emms to arrange for follow-up in the near future. We will plan to see him back in our office in approximately one year to make sure that he continues to do well, or sooner should the patient become interested proceeding with surgery and develop signs or symptoms of worsening left ventricular function or congestive heart failure. All of his questions have been addressed.   I spent in excess of 90 minutes during the conduct of this office consultation and >50% of this time involved direct face-to-face encounter with the patient for counseling  and/or coordination of their care.    Salvatore Decent. Cornelius Moras, MD 09/22/2016 9:48 AM

## 2016-10-04 NOTE — Progress Notes (Signed)
Have him f/u with me in a month

## 2016-10-08 ENCOUNTER — Encounter: Payer: Self-pay | Admitting: Internal Medicine

## 2016-10-10 MED FILL — BIKTARVY 50-200-25 MG TABS: 50-200-25 | 30 days supply | Qty: 30 | Fill #4

## 2016-11-03 MED FILL — BIKTARVY 50-200-25 MG TABS: 50-200-25 | 30 days supply | Qty: 30 | Fill #5

## 2016-11-12 ENCOUNTER — Ambulatory Visit: Payer: Medicare Other | Admitting: Internal Medicine

## 2016-11-13 ENCOUNTER — Other Ambulatory Visit: Payer: Medicare Other

## 2016-11-27 ENCOUNTER — Ambulatory Visit (INDEPENDENT_AMBULATORY_CARE_PROVIDER_SITE_OTHER): Payer: Medicare Other | Admitting: Internal Medicine

## 2016-11-27 VITALS — Ht 68.5 in | Wt 161.0 lb

## 2016-11-27 DIAGNOSIS — I2510 Atherosclerotic heart disease of native coronary artery without angina pectoris: Secondary | ICD-10-CM | POA: Diagnosis not present

## 2016-11-27 DIAGNOSIS — B20 Human immunodeficiency virus [HIV] disease: Secondary | ICD-10-CM | POA: Diagnosis present

## 2016-11-27 DIAGNOSIS — I200 Unstable angina: Secondary | ICD-10-CM | POA: Diagnosis not present

## 2016-11-27 DIAGNOSIS — I25700 Atherosclerosis of coronary artery bypass graft(s), unspecified, with unstable angina pectoris: Secondary | ICD-10-CM | POA: Diagnosis not present

## 2016-11-27 NOTE — Patient Instructions (Signed)
Need lab visit in 4-6 wk  Then we will see you back late dec

## 2016-11-27 NOTE — Progress Notes (Signed)
RFV: follow up for HIV disease  Patient ID: [name] Smith, male   DOB: October 22, 1960, 56 y.o.   MRN: [id number]  HPI [name] Smith is a 56yo M with HIV disease, Cd 4 count 1330/VL<20, on biktarvy, previously on genvoya. He was seen by dr. Ashley Mariner in July for severe AS,  He has hx of severe coronary artery disease s/p CABG in 2001 and redo CABG 4 in 2011. He has been followed carefully by Dr. Eden Emms for moderate to severe aortic stenosis, moderate aortic regurgitation. His most recent echocardiogram showed an increased gradient-peak gradient 57 mmHg, mean gradient 44 mmHg, valve area 0.77. Since asymptomatic, will follow with echo Q 6 months to see when beneficial for TAVR. Overall doing ok, continues to work out 4 days per week.  Outpatient Encounter Prescriptions as of 11/27/2016  Medication Sig  . albuterol (PROVENTIL HFA;VENTOLIN HFA) 108 (90 Base) MCG/ACT inhaler Inhale 2 puffs into the lungs every 6 (six) hours as needed for wheezing or shortness of breath.  . ARIPiprazole (ABILIFY) 15 MG tablet TAKE 1 TABLET BY MOUTH DAILY.  Marland Kitchen aspirin EC 81 MG tablet Take 1 tablet (81 mg total) by mouth daily.  . bictegravir-emtricitabine-tenofovir AF (BIKTARVY) 50-200-25 MG TABS tablet Take 1 tablet by mouth daily.  Marland Kitchen buPROPion (WELLBUTRIN) 100 MG tablet Take 1 tablet (100 mg total) by mouth daily.  . clopidogrel (PLAVIX) 75 MG tablet Take 1 tablet (75 mg total) by mouth daily.  . fluticasone (FLONASE) 50 MCG/ACT nasal spray Place 1 spray into both nostrils daily.  Marland Kitchen loratadine (CLARITIN) 10 MG tablet Take 1 tablet (10 mg total) by mouth daily.  . metoprolol succinate (TOPROL-XL) 50 MG 24 hr tablet TAKE 1 TABLET BY MOUTH DAILY. TAKE WITH OR IMMEDIATELY FOLLOWING A MEAL.  . Multiple Vitamin (MULTIVITAMIN WITH MINERALS) TABS tablet Take 1 tablet by mouth daily.  . nitroGLYCERIN (NITROSTAT) 0.4 MG SL tablet Place 1 tablet (0.4 mg total) under the tongue every 5 (five) minutes as needed. For chest pain -- may  repeat times three  . ranitidine (ZANTAC) 150 MG tablet TAKE 1 TABLET BY MOUTH TWICE A DAY  . rosuvastatin (CRESTOR) 20 MG tablet TAKE 1 TABLET (20 MG TOTAL) BY MOUTH DAILY.  . sildenafil (VIAGRA) 50 MG tablet Take 50 mg by mouth daily as needed for erectile dysfunction.  Marland Kitchen tiotropium (SPIRIVA) 18 MCG inhalation capsule Place 18 mcg into inhaler and inhale daily.  . traMADol (ULTRAM) 50 MG tablet TAKE 1 TABLET BY MOUTH EVERY 6 HOURS AS NEEDED FOR MODERATE TO SEVERE PAIN  . triamcinolone cream (KENALOG) 0.1 % Apply topically 2 (two) times daily. (Patient taking differently: Apply 1 application topically 2 (two) times daily as needed (rash). )  . umeclidinium bromide (INCRUSE ELLIPTA) 62.5 MCG/INH AEPB Inhale 1 puff into the lungs daily. (Patient taking differently: Inhale 2 puffs into the lungs daily as needed (breathing). )  . valACYclovir (VALTREX) 1000 MG tablet Take 1 tablet (1,000 mg total) by mouth 3 (three) times daily. (Patient taking differently: Take 500 mg by mouth 3 (three) times daily as needed (fever blisters). )  . amphetamine-dextroamphetamine (ADDERALL XR) 20 MG 24 hr capsule TAKE 1 CAPSULE BY MOUTH EVERY DAY IN THE MORNING  . GENVOYA 150-150-200-10 MG TABS tablet Take 1 tablet by mouth daily with breakfast.  . sildenafil (REVATIO) 20 MG tablet TAKE 1 TO 5 TABLETS AS NEEDED FOR ED  . [DISCONTINUED] ibuprofen (ADVIL,MOTRIN) 600 MG tablet Take 1 tablet (600 mg total) by mouth every  6 (six) hours as needed for pain.  . [DISCONTINUED] sucralfate (CARAFATE) 1 GM/10ML suspension Take 10 mLs (1 g total) by mouth 4 (four) times daily -  with meals and at bedtime. (Patient not taking: Reported on 11/27/2016)   No facility-administered encounter medications on file as of 11/27/2016.      Patient Active Problem List   Diagnosis Date Noted  . S/P CABG (coronary artery bypass graft)   . Severe aortic stenosis 08/12/2016  . Asthmatic bronchitis with acute exacerbation 01/03/2014  . Encounter  for disability assessment 06/08/2013  . Murmur 10/14/2011  . URI (upper respiratory infection) 04/29/2011  . Bruit 10/28/2010  . S/P redo CABG x 4 12/28/2009  . SMOKER 11/06/2008  . COUGH 11/06/2008  . HYPERCHOLESTEROLEMIA 11/03/2008  . ANXIETY 11/03/2008  . Essential hypertension 11/03/2008  . GERD 11/03/2008  . NEPHROLITHIASIS 11/03/2008  . HIV INFECTION 03/20/2006  . CORONARY ARTERY DISEASE 03/20/2006     Health Maintenance Due  Topic Date Due  . TETANUS/TDAP  03/04/1980  . COLONOSCOPY  03/05/2011  . INFLUENZA VACCINE  10/08/2016     Review of Systems Review of Systems  Constitutional: Negative for fever, chills, diaphoresis, activity change, appetite change, fatigue and unexpected weight change.  HENT: Negative for congestion, sore throat, rhinorrhea, sneezing, trouble swallowing and sinus pressure.  Eyes: Negative for photophobia and visual disturbance.  Respiratory: Negative for cough, chest tightness, shortness of breath, wheezing and stridor.  Cardiovascular: Negative for chest pain, palpitations and leg swelling.  Gastrointestinal: Negative for nausea, vomiting, abdominal pain, diarrhea, constipation, blood in stool, abdominal distention and anal bleeding.  Genitourinary: Negative for dysuria, hematuria, flank pain and difficulty urinating.  Musculoskeletal: Negative for myalgias, back pain, joint swelling, arthralgias and gait problem.  Skin: Negative for color change, pallor, rash and wound.  Neurological: Negative for dizziness, tremors, weakness and light-headedness.  Hematological: Negative for adenopathy. Does not bruise/bleed easily.  Psychiatric/Behavioral: Negative for behavioral problems, confusion, sleep disturbance, dysphoric mood, decreased concentration and agitation.    Physical Exam  Ht 5' 8.5" (1.74 m)   Wt 161 lb (73 kg)   BMI 24.12 kg/m   Constitutional: He is oriented to person, place, and time. He appears well-developed and well-nourished.  No distress.  HENT:  Mouth/Throat: Oropharynx is clear and moist. No oropharyngeal exudate.  Cardiovascular: Normal rate, regular rhythm and normal heart sounds. 3/6 SEM heard throughtout precordium Pulmonary/Chest: Effort normal and breath sounds normal. No respiratory distress. He has no wheezes.  Abdominal: Soft. Bowel sounds are normal. He exhibits no distension. There is no tenderness.  Lymphadenopathy:  He has no cervical adenopathy.  Neurological: He is alert and oriented to person, place, and time.  Skin: Skin is warm and dry. No rash noted. No erythema.  Psychiatric: He has a normal mood and affect. His behavior is normal.     Lab Results  Component Value Date   CD4TCELL 39 09/02/2016   Lab Results  Component Value Date   CD4TABS 1,330 09/02/2016   CD4TABS 1,270 02/26/2016   CD4TABS 1,220 08/24/2015   Lab Results  Component Value Date   HIV1RNAQUANT <20 NOT DETECTED 09/02/2016   Lab Results  Component Value Date   HEPBSAB Yes 05/04/2006   Lab Results  Component Value Date   LABRPR NON REAC 02/26/2016    CBC Lab Results  Component Value Date   WBC 8.8 09/02/2016   RBC 4.66 09/02/2016   HGB 15.8 09/02/2016   HCT 46.9 09/02/2016   PLT 238 09/02/2016  MCV 100.6 (H) 09/02/2016   MCH 33.9 (H) 09/02/2016   MCHC 33.7 09/02/2016   RDW 12.8 09/02/2016   LYMPHSABS 3,080 09/02/2016   MONOABS 528 09/02/2016   EOSABS 88 09/02/2016    BMET Lab Results  Component Value Date   NA 138 09/02/2016   K 4.3 09/02/2016   CL 104 09/02/2016   CO2 25 09/02/2016   GLUCOSE 82 09/02/2016   BUN 14 09/02/2016   CREATININE 1.04 09/02/2016   CALCIUM 9.7 09/02/2016   GFRNONAA 71 08/08/2016   GFRAA 82 08/08/2016    Assessment and Plan  hiv disease = will start biktarvy. Will stop genvoya  CAD with AS = continues to be asymptomatic. Continue on current regimen. Followed by cardiology, has repeat echo in 6 months  Health maintenance = will give flu vac

## 2016-12-01 ENCOUNTER — Other Ambulatory Visit: Payer: Self-pay

## 2016-12-01 MED ORDER — CLOPIDOGREL BISULFATE 75 MG PO TABS: 75.0000 mg | ORAL_TABLET | Freq: Every day | ORAL | 10 refills | Status: DC

## 2016-12-02 ENCOUNTER — Other Ambulatory Visit: Payer: Self-pay | Admitting: Internal Medicine

## 2016-12-02 ENCOUNTER — Telehealth: Payer: Self-pay | Admitting: Cardiovascular Disease

## 2016-12-02 MED ORDER — CLOPIDOGREL BISULFATE 75 MG PO TABS: 75.0000 mg | ORAL_TABLET | Freq: Every day | ORAL | 8 refills | Status: DC

## 2016-12-02 MED FILL — BIKTARVY 50-200-25 MG TABS: 50-200-25 | 30 days supply | Qty: 30 | Fill #6

## 2016-12-02 NOTE — Telephone Encounter (Signed)
New message   Pt states he has been out of his plavix for a week and wants a nurse to call him back ASAP.

## 2016-12-02 NOTE — Addendum Note (Signed)
Addended by: Alvin Critchley C on: 12/02/2016 04:00 PM   Modules accepted: Orders

## 2016-12-02 NOTE — Telephone Encounter (Signed)
Attempted to return call to patient. There was no answer VM full, unable to leave message.

## 2016-12-02 NOTE — Telephone Encounter (Signed)
Pt's medication was sent to pt's pharmacy as requested. Confirmation received.  °

## 2016-12-02 NOTE — Telephone Encounter (Signed)
New message     *STAT* If patient is at the pharmacy, call can be transferred to refill team.   1. Which medications need to be refilled? (please list name of each medication and dose if known) clopidogrel (PLAVIX) 75 MG tablet  2. Which pharmacy/location (including street and city if local pharmacy) is medication to be sent to? [facility] [address]  3. Do they need a 30 day or 90 day supply? 30 day supply

## 2016-12-02 NOTE — Telephone Encounter (Signed)
Called and spoke to patient on the other phone number listed on file. Patient states that he has been out of his plavix x 1 week. Patient states that he has been trying to get it filled for 2 weeks. Made patient aware that a refill for his plavix was just sent to [facility] on [address]. Patient states that his pharmacy did give him 3 pills the other day. Patient states that he is going to go to the pharmacy now to pick up his plavix. Patient thanked me for the call.

## 2016-12-03 ENCOUNTER — Other Ambulatory Visit: Payer: Self-pay | Admitting: Internal Medicine

## 2016-12-03 DIAGNOSIS — G894 Chronic pain syndrome: Secondary | ICD-10-CM

## 2016-12-03 NOTE — Telephone Encounter (Signed)
Called patient to make sure he received his Plavix.

## 2016-12-09 ENCOUNTER — Other Ambulatory Visit: Payer: Self-pay | Admitting: Internal Medicine

## 2016-12-09 DIAGNOSIS — F32A Depression, unspecified: Secondary | ICD-10-CM

## 2016-12-09 DIAGNOSIS — I10 Essential (primary) hypertension: Secondary | ICD-10-CM

## 2016-12-09 DIAGNOSIS — F329 Major depressive disorder, single episode, unspecified: Secondary | ICD-10-CM

## 2016-12-15 ENCOUNTER — Other Ambulatory Visit: Payer: Self-pay

## 2016-12-15 DIAGNOSIS — S0993XA Unspecified injury of face, initial encounter: Secondary | ICD-10-CM

## 2016-12-15 NOTE — Progress Notes (Signed)
[employer] employee was injured with dental instrument while working with  this patient. She was referred to [facility] for assessment and labs are needed as part of visit.    I have added Hep C and Hep B test per Occupational Health  .  Patient will be coming for routine testing on 01-06-17.  He is not aware of injury by dental employee.   Laurell Josephs, RN

## 2017-01-01 MED FILL — BIKTARVY 50-200-25 MG TABS: 50-200-25 | 30 days supply | Qty: 30 | Fill #7

## 2017-01-06 ENCOUNTER — Other Ambulatory Visit: Payer: Medicare Other

## 2017-01-06 DIAGNOSIS — S0993XA Unspecified injury of face, initial encounter: Secondary | ICD-10-CM

## 2017-01-06 DIAGNOSIS — B20 Human immunodeficiency virus [HIV] disease: Secondary | ICD-10-CM | POA: Diagnosis not present

## 2017-01-07 LAB — HEPATITIS C ANTIBODY
Hepatitis C Ab: NONREACTIVE
SIGNAL TO CUT-OFF: 0.03 (ref <1.00)

## 2017-01-07 LAB — HEPATITIS B SURFACE ANTIBODY,QUALITATIVE: Hep B S Ab: REACTIVE — AB

## 2017-01-07 LAB — HEPATITIS B SURFACE ANTIGEN: Hepatitis B Surface Ag: NONREACTIVE

## 2017-01-07 LAB — T-HELPER CELL (CD4) - (RCID CLINIC ONLY)
CD4 % Helper T Cell: 42 % (ref 33–55)
CD4 T Cell Abs: 1300 /uL (ref 400–2700)

## 2017-01-08 LAB — HIV-1 RNA QUANT-NO REFLEX-BLD
HIV 1 RNA Quant: 32 copies/mL — ABNORMAL HIGH
HIV-1 RNA Quant, Log: 1.51 Log copies/mL — ABNORMAL HIGH

## 2017-01-09 DIAGNOSIS — J069 Acute upper respiratory infection, unspecified: Secondary | ICD-10-CM | POA: Diagnosis not present

## 2017-01-20 ENCOUNTER — Other Ambulatory Visit: Payer: Self-pay | Admitting: *Deleted

## 2017-01-20 NOTE — Progress Notes (Signed)
Patient called with questions about his lab date/follow up date.  [facility] Dr Drue SecondSnider wanted labs 4-6 weeks after the switch. Patient stated he has also still been receiving Genvoya. RN corrected his pharmacy preferences in EPIC, contacted the pharmacies to stop Genvoya/confirm Biktarvy. Medication list updated. Andree CossHowell, Lisaann Atha M, RN

## 2017-02-04 ENCOUNTER — Other Ambulatory Visit: Payer: Self-pay | Admitting: Internal Medicine

## 2017-02-04 DIAGNOSIS — E785 Hyperlipidemia, unspecified: Secondary | ICD-10-CM

## 2017-02-08 NOTE — Progress Notes (Signed)
Patient ID: [name] DiamondMichael K Smith, male   DOB: 1960-10-07, 56 y.o.   MRN: [id number]   [name] Smith is seen today for F/U of dyspnea, CAD S/P CABG x2 With redo 10/11. EF has been mildly reduced in the 45% range. No clinical history of CHF. No arryhthmia and no indication for AICD. Still smoking. Clinical COPD.  Antiretroviral changed and zocor stopped now on crestor  Having issues with depression ? Bipolar Discussed the fact that he should not take viagra or chantix Counseled at length about smoking cessation   Discussed possible TAVR since he has had open heart srugery 2001 and 2011  Had right and left cath with Dr Excell Seltzerooper on 08/12/16 Reviewed Mixed AS/AR mean gradient only 20 mmHg but 3/4 grade AR Seen by Dr Cornelius Moraswen July 2018 and TAVR postponed due to stable symptoms and patient not willing to commit Concern for EF 45-50%  Sized to 23 mm Sapien with questionable femoral access Seen by Dr Cornelius Moraswen in July and patient reluctant to pursue    Conclusion   1. Severe native coronary artery disease with severe stenosis of the left main and total occlusion of the LAD, left circumflex, and right coronary arteries 2. Status post redo CABG with continued patency of the LIMA to LAD, saphenous vein graft OM, and sequential saphenous vein graft to PDA and PLA branches. All other saphenous vein grafts are occluded. 3. Mixed aortic valve disease with aortic stenosis and 3+ insufficiency 4. Normal right heart hemodynamics  Recommendations: Will review case with Dr. Eden Emmsnishan. The patient's aortic valve gradients are less by cardiac catheterization than that of his echo study. However, his aortic insufficiency is more impressive by aortic root angiography. I suspect he has mixed disease with an increase in his gradient related to flow. His aortic valve area is overestimated by this study because of a high cardiac output based on Fick calculation.     Echo 08/05/16 reviewed and progression of AS EF 45-50% mean gradient 44 mmHg peak 57  mmHg moderate AR Root 39 mm   Denies dyspnea chest pain palpitations syncope or edema. Long discussion about timing Of TAVR. Not clear that he can be done trans femorally Concerns about longevity of Stented valve since he is young. Currently asymptomatic. Discussed issues with decreasing EF. Also complains of frequent sinus infections and need to see ENT again .   ROS: Denies fever, malais, weight loss, blurry vision, decreased visual acuity, cough, sputum, SOB, hemoptysis, pleuritic pain, palpitaitons, heartburn, abdominal pain, melena, lower extremity edema, claudication, or rash.  All other systems reviewed and negative  General: Affect appropriate Chronically ill white male  HEENT: normal Neck supple with no adenopathy JVP normal no bruits no thyromegaly Lungs clear with no wheezing and good diaphragmatic motion Heart:  S1/S2 muffled AS /AR  murmur, no rub, gallop or click PMI normal Abdomen: benighn, BS positve, no tenderness, no AAA no bruit.  No HSM or HJR Distal pulses intact with no bruits No edema Neuro non-focal Skin warm and dry No muscular weakness   Current Outpatient Medications  Medication Sig Dispense Refill  . albuterol (PROVENTIL HFA;VENTOLIN HFA) 108 (90 Base) MCG/ACT inhaler Inhale 2 puffs into the lungs every 6 (six) hours as needed for wheezing or shortness of breath. 1 Inhaler 6  . amphetamine-dextroamphetamine (ADDERALL XR) 20 MG 24 hr capsule TAKE 1 CAPSULE BY MOUTH EVERY DAY IN THE MORNING  0  . ARIPiprazole (ABILIFY) 15 MG tablet TAKE 1 TABLET BY MOUTH DAILY. 90 tablet 1  .  aspirin EC 81 MG tablet Take 1 tablet (81 mg total) by mouth daily. 90 tablet 3  . bictegravir-emtricitabine-tenofovir AF (BIKTARVY) 50-200-25 MG TABS tablet Take 1 tablet by mouth daily.    Marland Kitchen. buPROPion (WELLBUTRIN) 100 MG tablet Take 1 tablet (100 mg total) by mouth daily. 30 tablet 3  . clopidogrel (PLAVIX) 75 MG tablet Take 1 tablet (75 mg total) by mouth daily. 30 tablet 8  .  elvitegravir-cobicistat-emtricitabine-tenofovir (GENVOYA) 150-150-200-10 MG TABS tablet Take 1 tablet by mouth daily.    . fluticasone (FLONASE) 50 MCG/ACT nasal spray Place 1 spray into both nostrils daily.    Marland Kitchen. loratadine (CLARITIN) 10 MG tablet Take 1 tablet (10 mg total) by mouth daily. 30 tablet 6  . metoprolol succinate (TOPROL-XL) 50 MG 24 hr tablet TAKE 1 TABLET BY MOUTH DAILY. TAKE WITH OR IMMEDIATELY FOLLOWING A MEAL. 90 tablet 1  . Multiple Vitamin (MULTIVITAMIN WITH MINERALS) TABS tablet Take 1 tablet by mouth daily.    . nitroGLYCERIN (NITROSTAT) 0.4 MG SL tablet Place 1 tablet (0.4 mg total) under the tongue every 5 (five) minutes as needed. For chest pain -- may repeat times three 25 tablet 11  . ranitidine (ZANTAC) 150 MG tablet TAKE 1 TABLET BY MOUTH TWICE A DAY 60 tablet 0  . rosuvastatin (CRESTOR) 20 MG tablet TAKE 1 TABLET BY MOUTH EVERY DAY 30 tablet 5  . sildenafil (REVATIO) 20 MG tablet TAKE 1 TO 5 TABLETS AS NEEDED FOR ED  3  . sildenafil (VIAGRA) 50 MG tablet Take 50 mg by mouth daily as needed for erectile dysfunction.    Marland Kitchen. tiotropium (SPIRIVA) 18 MCG inhalation capsule Place 18 mcg into inhaler and inhale daily.    . traMADol (ULTRAM) 50 MG tablet TAKE 1 TABLET BY MOUTH EVERY 6 HOURS AS NEEDED FOR MODERATE TO SEVERE PAIN 30 tablet 5  . triamcinolone cream (KENALOG) 0.1 % Apply topically 2 (two) times daily. (Patient taking differently: Apply 1 application topically 2 (two) times daily as needed (rash). ) 30 g 1  . umeclidinium bromide (INCRUSE ELLIPTA) 62.5 MCG/INH AEPB Inhale 1 puff into the lungs daily. (Patient taking differently: Inhale 2 puffs into the lungs daily as needed (breathing). ) 1 each 5  . valACYclovir (VALTREX) 1000 MG tablet Take 1 tablet (1,000 mg total) by mouth 3 (three) times daily. (Patient taking differently: Take 500 mg by mouth 3 (three) times daily as needed (fever blisters). ) 21 tablet 3   No current facility-administered medications for this  visit.     Allergies  Azithromycin  Electrocardiogram:   SR rate 102 LVH  03/25/13  05/02/14  SR rate 84 LVH no sig change  07/12/15 ST rate 100 LVH  08/08/16 SR rate 86 LVH   Assessment and Plan CAD/CABG:  X 2 last 2011  Patent grafts to LAD/OM and RCA by cath 08/12/16  Agree with Dr Excell Seltzerooper TAVR would be better than  3rd sternotomy Stable See below regarding TAVR   AS/AR:  In setting of EF 45-50% echo 08/05/16 Moderate AR mean Gradient 44 mmHg peak 57 mmHg By cath 08/12/16 mean gradient 20 mmHg with grade 3/4 AR Seen by Dr Cornelius Moraswen July 2018 and patient reluctant to proceed with TAVR Symptoms stable   Also concerned at young age What durability of Sapien 3 or Evolut valve would be. Will update echo . In absence of symptoms continuing Decrease in EF would be indication to proceed sooner   HIV:  meds adjusted by ID need to  be careful about statin interaction  COPD:  Stopped  smoking has more dyspnea   Bipolar:  On Abilify mood stable still very stressed about not getting disability Chol:  Now on crestor as HIV med changed  Doubt leg pain from this but will have him take it qod for 3 weeks and see if helps Lab Results  Component Value Date   LDLCALC 101 (H) 02/26/2016   Bruit:  F/u duplex ASA duplex  08/03/15 had plaque no stenosis  ENT:  Has deviated septum with frequent severe sinus infections will refer back to ENT for evaluation     Charlton Haws

## 2017-02-10 ENCOUNTER — Other Ambulatory Visit: Payer: Self-pay | Admitting: Pharmacist Clinician (PhC)/ Clinical Pharmacy Specialist

## 2017-02-10 ENCOUNTER — Ambulatory Visit (INDEPENDENT_AMBULATORY_CARE_PROVIDER_SITE_OTHER): Payer: Medicare Other | Admitting: Cardiovascular Disease

## 2017-02-10 ENCOUNTER — Encounter: Payer: Self-pay | Admitting: Cardiovascular Disease

## 2017-02-10 VITALS — BP 138/62 | HR 74 | Ht 68.0 in | Wt 162.8 lb

## 2017-02-10 DIAGNOSIS — I2510 Atherosclerotic heart disease of native coronary artery without angina pectoris: Secondary | ICD-10-CM

## 2017-02-10 DIAGNOSIS — J342 Deviated nasal septum: Secondary | ICD-10-CM

## 2017-02-10 DIAGNOSIS — I350 Nonrheumatic aortic (valve) stenosis: Secondary | ICD-10-CM | POA: Diagnosis not present

## 2017-02-10 MED ORDER — BICTEGRAVIR-EMTRICITAB-TENOFOV 50-200-25 MG PO TABS: 1.0000 | ORAL_TABLET | Freq: Every day | ORAL | 11 refills | Status: DC

## 2017-02-10 NOTE — Progress Notes (Signed)
Putting Biktarvy on profile.

## 2017-02-10 NOTE — Patient Instructions (Addendum)
Medication Instructions:  Your physician recommends that you continue on your current medications as directed. Please refer to the Current Medication list given to you today.  Labwork: NONE  Testing/Procedures: Your physician has requested that you have an echocardiogram. Echocardiography is a painless test that uses sound waves to create images of your heart. It provides your doctor with information about the size and shape of your heart and how well your heart's chambers and valves are working. This procedure takes approximately one hour. There are no restrictions for this procedure.  Follow-Up: Your physician wants you to follow-up in: 6 months with Dr. Eden EmmsNishan. You will receive a reminder letter in the mail two months in advance. If you don't receive a letter, please call our office to schedule the follow-up appointment.  You have been referred to ENT doctor for deviated septum.    If you need a refill on your cardiac medications before your next appointment, please call your pharmacy.

## 2017-02-24 ENCOUNTER — Ambulatory Visit: Payer: Medicare Other | Admitting: Internal Medicine

## 2017-03-16 ENCOUNTER — Ambulatory Visit (HOSPITAL_COMMUNITY): Payer: Medicare Other | Attending: Cardiology

## 2017-03-16 ENCOUNTER — Other Ambulatory Visit: Payer: Self-pay

## 2017-03-16 DIAGNOSIS — I082 Rheumatic disorders of both aortic and tricuspid valves: Secondary | ICD-10-CM | POA: Diagnosis not present

## 2017-03-16 DIAGNOSIS — Z21 Asymptomatic human immunodeficiency virus [HIV] infection status: Secondary | ICD-10-CM | POA: Insufficient documentation

## 2017-03-16 DIAGNOSIS — I2510 Atherosclerotic heart disease of native coronary artery without angina pectoris: Secondary | ICD-10-CM | POA: Insufficient documentation

## 2017-03-16 DIAGNOSIS — Z951 Presence of aortocoronary bypass graft: Secondary | ICD-10-CM | POA: Diagnosis not present

## 2017-03-16 DIAGNOSIS — I10 Essential (primary) hypertension: Secondary | ICD-10-CM | POA: Insufficient documentation

## 2017-03-16 DIAGNOSIS — E785 Hyperlipidemia, unspecified: Secondary | ICD-10-CM | POA: Diagnosis not present

## 2017-03-16 DIAGNOSIS — I350 Nonrheumatic aortic (valve) stenosis: Secondary | ICD-10-CM | POA: Diagnosis not present

## 2017-03-16 DIAGNOSIS — Z87891 Personal history of nicotine dependence: Secondary | ICD-10-CM | POA: Insufficient documentation

## 2017-03-17 ENCOUNTER — Other Ambulatory Visit: Payer: Self-pay | Admitting: *Deleted

## 2017-03-17 DIAGNOSIS — Z79899 Other long term (current) drug therapy: Secondary | ICD-10-CM

## 2017-03-17 DIAGNOSIS — B20 Human immunodeficiency virus [HIV] disease: Secondary | ICD-10-CM

## 2017-03-17 DIAGNOSIS — Z113 Encounter for screening for infections with a predominantly sexual mode of transmission: Secondary | ICD-10-CM

## 2017-03-23 ENCOUNTER — Other Ambulatory Visit (HOSPITAL_COMMUNITY): Payer: Medicare Other

## 2017-03-26 ENCOUNTER — Ambulatory Visit: Payer: Medicare Other

## 2017-03-26 ENCOUNTER — Other Ambulatory Visit: Payer: Medicare Other

## 2017-03-26 DIAGNOSIS — B20 Human immunodeficiency virus [HIV] disease: Secondary | ICD-10-CM

## 2017-03-26 DIAGNOSIS — Z113 Encounter for screening for infections with a predominantly sexual mode of transmission: Secondary | ICD-10-CM

## 2017-03-26 DIAGNOSIS — Z79899 Other long term (current) drug therapy: Secondary | ICD-10-CM | POA: Diagnosis not present

## 2017-03-27 LAB — CBC WITH DIFFERENTIAL/PLATELET
Basophils Absolute: 26 cells/uL (ref 0–200)
Basophils Relative: 0.3 %
Eosinophils Absolute: 53 cells/uL (ref 15–500)
Eosinophils Relative: 0.6 %
HCT: 45.3 % (ref 38.5–50.0)
Hemoglobin: 15.6 g/dL (ref 13.2–17.1)
Lymphs Abs: 3212 cells/uL (ref 850–3900)
MCH: 33.6 pg — ABNORMAL HIGH (ref 27.0–33.0)
MCHC: 34.4 g/dL (ref 32.0–36.0)
MCV: 97.6 fL (ref 80.0–100.0)
MPV: 10.3 fL (ref 7.5–12.5)
Monocytes Relative: 5.3 %
Neutro Abs: 5042 cells/uL (ref 1500–7800)
Neutrophils Relative %: 57.3 %
Platelets: 199 10*3/uL (ref 140–400)
RBC: 4.64 10*6/uL (ref 4.20–5.80)
RDW: 11.9 % (ref 11.0–15.0)
Total Lymphocyte: 36.5 %
WBC mixed population: 466 cells/uL (ref 200–950)
WBC: 8.8 10*3/uL (ref 3.8–10.8)

## 2017-03-27 LAB — RPR: RPR Ser Ql: NONREACTIVE

## 2017-03-27 LAB — COMPREHENSIVE METABOLIC PANEL
AG Ratio: 1.8 (calc) (ref 1.0–2.5)
ALT: 17 U/L (ref 9–46)
AST: 17 U/L (ref 10–35)
Albumin: 4.2 g/dL (ref 3.6–5.1)
Alkaline phosphatase (APISO): 48 U/L (ref 40–115)
BUN: 15 mg/dL (ref 7–25)
CO2: 25 mmol/L (ref 20–32)
Calcium: 9.4 mg/dL (ref 8.6–10.3)
Chloride: 104 mmol/L (ref 98–110)
Creat: 1.13 mg/dL (ref 0.70–1.33)
Globulin: 2.4 g/dL (calc) (ref 1.9–3.7)
Glucose, Bld: 134 mg/dL — ABNORMAL HIGH (ref 65–99)
Potassium: 4.2 mmol/L (ref 3.5–5.3)
Sodium: 138 mmol/L (ref 135–146)
Total Bilirubin: 0.9 mg/dL (ref 0.2–1.2)
Total Protein: 6.6 g/dL (ref 6.1–8.1)

## 2017-03-27 LAB — T-HELPER CELL (CD4) - (RCID CLINIC ONLY)
CD4 % Helper T Cell: 43 % (ref 33–55)
CD4 T Cell Abs: 1430 /uL (ref 400–2700)

## 2017-03-27 LAB — LIPID PANEL
Cholesterol: 166 mg/dL (ref <200)
HDL: 42 mg/dL (ref >40)
LDL Cholesterol (Calc): 97 mg/dL (calc)
Non-HDL Cholesterol (Calc): 124 mg/dL (calc) (ref <130)
Total CHOL/HDL Ratio: 4 (calc) (ref <5.0)
Triglycerides: 169 mg/dL — ABNORMAL HIGH (ref <150)

## 2017-03-28 LAB — HIV-1 RNA QUANT-NO REFLEX-BLD
HIV 1 RNA Quant: <20 copies/mL
HIV-1 RNA Quant, Log: <1.3 Log copies/mL

## 2017-04-09 ENCOUNTER — Ambulatory Visit (INDEPENDENT_AMBULATORY_CARE_PROVIDER_SITE_OTHER): Payer: Medicare Other | Admitting: Internal Medicine

## 2017-04-09 ENCOUNTER — Encounter: Payer: Self-pay | Admitting: Internal Medicine

## 2017-04-09 VITALS — BP 134/85 | HR 89 | Temp 97.8°F | Ht 68.0 in | Wt 167.0 lb

## 2017-04-09 DIAGNOSIS — Z23 Encounter for immunization: Secondary | ICD-10-CM | POA: Diagnosis not present

## 2017-04-09 DIAGNOSIS — I350 Nonrheumatic aortic (valve) stenosis: Secondary | ICD-10-CM | POA: Diagnosis not present

## 2017-04-09 DIAGNOSIS — R37 Sexual dysfunction, unspecified: Secondary | ICD-10-CM

## 2017-04-09 DIAGNOSIS — B20 Human immunodeficiency virus [HIV] disease: Secondary | ICD-10-CM | POA: Diagnosis not present

## 2017-04-09 MED ORDER — AMOXICILLIN 500 MG PO CAPS: 500.0000 mg | ORAL_CAPSULE | Freq: Three times a day (TID) | ORAL | 0 refills | Status: DC

## 2017-04-09 NOTE — Progress Notes (Signed)
RFV:  Follow up for hiv disease  Patient ID: [name] Smith, male   DOB: September 17, 1960, 57 y.o.   MRN: [id number]  HPI  57yo M with well controlled hiv disease, cad, severe AS, COPD who is very adherent to his various regimens and states that he has been doing well, until this past week when he noticed having increasing sinus pressure, headache, nasal congestion. Has been using netty pot without much improvement. Has been taking aspirin to help with his symptoms without much relief.  Denies any unprotected sex. Only has one regular partner. He has noticed ongoing difficulty with getting an erection. Has occasionally used sidinafil.  Outpatient Encounter Medications as of 04/09/2017  Medication Sig  . albuterol (PROVENTIL HFA;VENTOLIN HFA) 108 (90 Base) MCG/ACT inhaler Inhale 2 puffs into the lungs every 6 (six) hours as needed for wheezing or shortness of breath.  . amphetamine-dextroamphetamine (ADDERALL XR) 20 MG 24 hr capsule TAKE 1 CAPSULE BY MOUTH EVERY DAY IN THE MORNING  . ARIPiprazole (ABILIFY) 15 MG tablet TAKE 1 TABLET BY MOUTH DAILY.  Marland Kitchen. aspirin EC 81 MG tablet Take 1 tablet (81 mg total) by mouth daily.  . bictegravir-emtricitabine-tenofovir AF (BIKTARVY) 50-200-25 MG TABS tablet Take 1 tablet by mouth daily.  Marland Kitchen. buPROPion (WELLBUTRIN) 100 MG tablet Take 1 tablet (100 mg total) by mouth daily.  . clopidogrel (PLAVIX) 75 MG tablet Take 1 tablet (75 mg total) by mouth daily.  . fluticasone (FLONASE) 50 MCG/ACT nasal spray Place 1 spray into both nostrils daily.  Marland Kitchen. loratadine (CLARITIN) 10 MG tablet Take 1 tablet (10 mg total) by mouth daily.  . metoprolol succinate (TOPROL-XL) 50 MG 24 hr tablet TAKE 1 TABLET BY MOUTH DAILY. TAKE WITH OR IMMEDIATELY FOLLOWING A MEAL.  . Multiple Vitamin (MULTIVITAMIN WITH MINERALS) TABS tablet Take 1 tablet by mouth daily.  . nitroGLYCERIN (NITROSTAT) 0.4 MG SL tablet Place 1 tablet (0.4 mg total) under the tongue every 5 (five) minutes as needed.  For chest pain -- may repeat times three  . ranitidine (ZANTAC) 150 MG tablet TAKE 1 TABLET BY MOUTH TWICE A DAY  . rosuvastatin (CRESTOR) 20 MG tablet TAKE 1 TABLET BY MOUTH EVERY DAY  . sildenafil (REVATIO) 20 MG tablet TAKE 1 TO 5 TABLETS AS NEEDED FOR ED  . sildenafil (VIAGRA) 50 MG tablet Take 50 mg by mouth daily as needed for erectile dysfunction.  Marland Kitchen. tiotropium (SPIRIVA) 18 MCG inhalation capsule Place 18 mcg into inhaler and inhale daily.  . traMADol (ULTRAM) 50 MG tablet TAKE 1 TABLET BY MOUTH EVERY 6 HOURS AS NEEDED FOR MODERATE TO SEVERE PAIN  . triamcinolone cream (KENALOG) 0.1 % Apply topically 2 (two) times daily. (Patient taking differently: Apply 1 application topically 2 (two) times daily as needed (rash). )  . umeclidinium bromide (INCRUSE ELLIPTA) 62.5 MCG/INH AEPB Inhale 1 puff into the lungs daily. (Patient taking differently: Inhale 2 puffs into the lungs daily as needed (breathing). )  . valACYclovir (VALTREX) 1000 MG tablet Take 1 tablet (1,000 mg total) by mouth 3 (three) times daily. (Patient taking differently: Take 500 mg by mouth 3 (three) times daily as needed (fever blisters). )   No facility-administered encounter medications on file as of 04/09/2017.      Patient Active Problem List   Diagnosis Date Noted  . S/P CABG (coronary artery bypass graft)   . Severe aortic stenosis 08/12/2016  . Asthmatic bronchitis with acute exacerbation 01/03/2014  . Encounter for disability assessment 06/08/2013  . Murmur  10/14/2011  . URI (upper respiratory infection) 04/29/2011  . Bruit 10/28/2010  . S/P redo CABG x 4 12/28/2009  . SMOKER 11/06/2008  . COUGH 11/06/2008  . HYPERCHOLESTEROLEMIA 11/03/2008  . ANXIETY 11/03/2008  . Essential hypertension 11/03/2008  . GERD 11/03/2008  . NEPHROLITHIASIS 11/03/2008  . HIV INFECTION 03/20/2006  . CORONARY ARTERY DISEASE 03/20/2006     Health Maintenance Due  Topic Date Due  . COLONOSCOPY  03/05/2011  . INFLUENZA VACCINE   10/08/2016     Review of Systems Per hpi, otherwise 12 point ros is negative Physical Exam   BP 134/85   Pulse 89   Temp 97.8 F (36.6 C) (Oral)   Ht 5\' 8"  (1.727 m)   Wt 167 lb (75.8 kg)   BMI 25.39 kg/m   Physical Exam  Constitutional: He is oriented to person, place, and time. He appears well-developed and well-nourished. No distress.  HENT:  Mouth/Throat: Oropharynx is clear and moist. No oropharyngeal exudate but erythamatous. Nares are boggy and edematous Cardiovascular: Normal rate, regular rhythm and normal heart sounds. 3/6 holosystolic m bh across precordium Pulmonary/Chest: Effort normal and breath sounds normal. No respiratory distress. He has no wheezes.  Abdominal: Soft. Bowel sounds are normal. He exhibits no distension. There is no tenderness.  Lymphadenopathy:  He has no cervical adenopathy.  Neurological: He is alert and oriented to person, place, and time.  Skin: Skin is warm and dry. No rash noted. No erythema.  Psychiatric: He has a normal mood and affect. His behavior is normal.    Lab Results  Component Value Date   CD4TCELL 43 03/26/2017   Lab Results  Component Value Date   CD4TABS 1,430 03/26/2017   CD4TABS 1,300 01/06/2017   CD4TABS 1,330 09/02/2016   Lab Results  Component Value Date   HIV1RNAQUANT <20 NOT DETECTED 03/26/2017   Lab Results  Component Value Date   HEPBSAB REACTIVE (A) 01/06/2017   Lab Results  Component Value Date   LABRPR NON-REACTIVE 03/26/2017    CBC Lab Results  Component Value Date   WBC 8.8 03/26/2017   RBC 4.64 03/26/2017   HGB 15.6 03/26/2017   HCT 45.3 03/26/2017   PLT 199 03/26/2017   MCV 97.6 03/26/2017   MCH 33.6 (H) 03/26/2017   MCHC 34.4 03/26/2017   RDW 11.9 03/26/2017   LYMPHSABS 3,212 03/26/2017   MONOABS 528 09/02/2016   EOSABS 53 03/26/2017    BMET Lab Results  Component Value Date   NA 138 03/26/2017   K 4.2 03/26/2017   CL 104 03/26/2017   CO2 25 03/26/2017   GLUCOSE 134 (H)  03/26/2017   BUN 15 03/26/2017   CREATININE 1.13 03/26/2017   CALCIUM 9.4 03/26/2017   GFRNONAA 71 08/08/2016   GFRAA 82 08/08/2016      Assessment and Plan  Sinusitis = it has been worsening over the past 7days. Will treat with course of  amoxicillin  Decrease sexual function = - will test testoterone level in early am labs to see if due to testosterone deficiency - Refill of viagra  hiv disease =continue with current regimen of biktarvy  Severe AS = symptoms unchanged, has follow up with dr Cornelius Moras and dr Eden Emms. Currently patient is not in the rush to do TAVR since symptoms are unchanged  Health maintenance = will give flu shot today since it does not appear that he received it at last visit

## 2017-04-22 DIAGNOSIS — B20 Human immunodeficiency virus [HIV] disease: Secondary | ICD-10-CM | POA: Diagnosis not present

## 2017-04-22 DIAGNOSIS — L723 Sebaceous cyst: Secondary | ICD-10-CM | POA: Diagnosis not present

## 2017-04-26 ENCOUNTER — Other Ambulatory Visit: Payer: Self-pay | Admitting: Internal Medicine

## 2017-04-26 DIAGNOSIS — B029 Zoster without complications: Secondary | ICD-10-CM

## 2017-04-27 ENCOUNTER — Other Ambulatory Visit: Payer: Self-pay | Admitting: Internal Medicine

## 2017-05-04 ENCOUNTER — Encounter: Payer: Self-pay | Admitting: Internal Medicine

## 2017-05-14 ENCOUNTER — Other Ambulatory Visit: Payer: Self-pay | Admitting: *Deleted

## 2017-05-14 DIAGNOSIS — Z113 Encounter for screening for infections with a predominantly sexual mode of transmission: Secondary | ICD-10-CM

## 2017-05-15 ENCOUNTER — Other Ambulatory Visit: Payer: Medicare Other

## 2017-05-15 ENCOUNTER — Other Ambulatory Visit (HOSPITAL_COMMUNITY)
Admission: RE | Admit: 2017-05-15 | Discharge: 2017-05-15 | Disposition: A | Discharge status: Home / Self Care | Payer: Medicare Other | Source: Ambulatory Visit | Attending: Internal Medicine | Admitting: Internal Medicine

## 2017-05-15 DIAGNOSIS — Z113 Encounter for screening for infections with a predominantly sexual mode of transmission: Secondary | ICD-10-CM | POA: Insufficient documentation

## 2017-05-16 LAB — URINE CYTOLOGY ANCILLARY ONLY
Chlamydia: NEGATIVE
Neisseria Gonorrhea: NEGATIVE

## 2017-05-18 LAB — RPR: RPR Ser Ql: NONREACTIVE

## 2017-06-11 ENCOUNTER — Other Ambulatory Visit: Payer: Self-pay | Admitting: *Deleted

## 2017-06-11 DIAGNOSIS — B20 Human immunodeficiency virus [HIV] disease: Secondary | ICD-10-CM

## 2017-06-13 ENCOUNTER — Other Ambulatory Visit: Payer: Self-pay | Admitting: Internal Medicine

## 2017-06-13 DIAGNOSIS — F329 Major depressive disorder, single episode, unspecified: Secondary | ICD-10-CM

## 2017-06-13 DIAGNOSIS — F32A Depression, unspecified: Secondary | ICD-10-CM

## 2017-06-15 ENCOUNTER — Other Ambulatory Visit: Payer: Self-pay | Admitting: Internal Medicine

## 2017-06-15 DIAGNOSIS — I10 Essential (primary) hypertension: Secondary | ICD-10-CM

## 2017-06-15 NOTE — Progress Notes (Signed)
Patient ID: [name] Smith, male   DOB: 03/14/1960, 57 y.o.   MRN: [id number]   [name] Smith is a compliated 57 yo patient seen in f/u for CAD and AV disease.  He has already had two CABG;s most recently in October 2011 EF 45% range. He has HIV followed by Dr Drue SecondSnider with chronic sinus congestion and deviated septum. On crestor for HLD. Bipolar with depression and on disability. 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 Clinical COPD still smoking. He has had progressive AV disease. Seen by Dr Donata ClayVan Trigt June 2018 agreed he is not a candidate for 3rd sternotomy Living off grafts and no vein conduits for repair. Last echo reviewed 03/16/17  and shows progressive mean gradient 33 mmHg was over 40  Peak 57 mmHg with some of the elevated gradient due to plus 3 AR by cath.  Had right and left cath with Dr Excell Seltzerooper on 08/12/16 Reviewed Mixed AS/AR mean gradient only 20 mmHg but 3/4 grade AR Seen by Dr Cornelius Moraswen July 2018 and TAVR postponed due to stable symptoms and patient not willing to commit Concern for EF 45-50%  Sized to 23 mm Sapien with questionable femoral access Seen by Dr Cornelius Moraswen in July and patient reluctant to pursue   Denies dyspnea chest pain palpitations syncope or edema. Long discussion about timing Of TAVR. Not clear that he can be done trans femorally Concerns about longevity of Stented valve since he is young. Currently asymptomatic. Discussed issues with decreasing EF  Cardiac CTA reviewed 09/18/16 and annular area 418 suitable for a 23 mm Sapien  CT abdomen pelvis read by radiology as not suitable for trans femoral delivery   He is working out at [employer] and has more leg fatigue than dyspnea  He has sinus infection and allergies   Conclusion   1. Severe native coronary artery disease with severe stenosis of the left main and total occlusion of the LAD, left circumflex, and right coronary arteries 2. Status post redo CABG with continued patency of the LIMA to LAD, saphenous vein graft OM, and sequential  saphenous vein graft to PDA and PLA branches. All other saphenous vein grafts are occluded. 3. Mixed aortic valve disease with aortic stenosis and 3+ insufficiency 4. Normal right heart hemodynamics  Recommendations: Will review case with Dr. Eden Emmsnishan. The patient's aortic valve gradients are less by cardiac catheterization than that of his echo study. However, his aortic insufficiency is more impressive by aortic root angiography. I suspect he has mixed disease with an increase in his gradient related to flow. His aortic valve area is overestimated by this study because of a high cardiac output based on Fick calculation.       Denies dyspnea chest pain palpitations syncope or edema. Long discussion about timing Of TAVR. Not clear that he can be done trans femorally Concerns about longevity of Stented valve since he is young. Currently asymptomatic. Discussed issues with decreasing EF. Also complains of frequent sinus infections and need to see ENT again .   ROS: Denies fever, malais, weight loss, blurry vision, decreased visual acuity, cough, sputum, SOB, hemoptysis, pleuritic pain, palpitaitons, heartburn, abdominal pain, melena, lower extremity edema, claudication, or rash.  All other systems reviewed and negative  General: BP 120/62   Pulse 79   Ht 5\' 8"  (1.727 m)   Wt 167 lb 4 oz (75.9 kg)   SpO2 97%   BMI 25.43 kg/m  Affect appropriate Chronically ill thin white male  HEENT: deviated septum  Neck  supple with no adenopathy JVP normal no bruits no thyromegaly Lungs COPD decreased BS no wheezing and good diaphragmatic motion Heart:  S1/S2 no murmur, no rub, gallop or click PMI normal Abdomen: benighn, BS positve, no tenderness, no AAA no bruit.  No HSM or HJR Distal pulses intact with no bruits No edema No venous conduits for bypass left  Neuro non-focal Skin warm and dry No muscular weakness    Current Outpatient Medications  Medication Sig Dispense Refill  . albuterol  (PROVENTIL HFA;VENTOLIN HFA) 108 (90 Base) MCG/ACT inhaler Inhale 2 puffs into the lungs every 6 (six) hours as needed for wheezing or shortness of breath. 1 Inhaler 6  . amphetamine-dextroamphetamine (ADDERALL XR) 20 MG 24 hr capsule TAKE 1 CAPSULE BY MOUTH EVERY DAY IN THE MORNING  0  . ARIPiprazole (ABILIFY) 15 MG tablet TAKE 1 TABLET BY MOUTH EVERY DAY 90 tablet 1  . aspirin EC 81 MG tablet Take 1 tablet (81 mg total) by mouth daily. 90 tablet 3  . bictegravir-emtricitabine-tenofovir AF (BIKTARVY) 50-200-25 MG TABS tablet Take 1 tablet by mouth daily. 30 tablet 11  . buPROPion (WELLBUTRIN) 100 MG tablet Take 1 tablet (100 mg total) by mouth daily. 30 tablet 3  . clopidogrel (PLAVIX) 75 MG tablet Take 1 tablet (75 mg total) by mouth daily. 30 tablet 8  . fluticasone (FLONASE) 50 MCG/ACT nasal spray Place 1 spray into both nostrils daily.    Marland Kitchen loratadine (CLARITIN) 10 MG tablet Take 1 tablet (10 mg total) by mouth daily. 30 tablet 6  . metoprolol succinate (TOPROL-XL) 50 MG 24 hr tablet TAKE 1 TABLET BY MOUTH DAILY. TAKE WITH OR IMMEDIATELY FOLLOWING A MEAL. 90 tablet 1  . Multiple Vitamin (MULTIVITAMIN WITH MINERALS) TABS tablet Take 1 tablet by mouth daily.    . nitroGLYCERIN (NITROSTAT) 0.4 MG SL tablet Place 1 tablet (0.4 mg total) under the tongue every 5 (five) minutes as needed. For chest pain -- may repeat times three 25 tablet 11  . ranitidine (ZANTAC) 150 MG tablet TAKE 1 TABLET BY MOUTH TWICE A DAY 60 tablet 0  . rosuvastatin (CRESTOR) 20 MG tablet TAKE 1 TABLET BY MOUTH EVERY DAY 30 tablet 5  . sildenafil (REVATIO) 20 MG tablet TAKE 1 TO 5 TABLETS AS NEEDED FOR ED  3  . tiotropium (SPIRIVA) 18 MCG inhalation capsule Place 18 mcg into inhaler and inhale daily.    . traMADol (ULTRAM) 50 MG tablet TAKE 1 TABLET BY MOUTH EVERY 6 HOURS AS NEEDED FOR MODERATE TO SEVERE PAIN 30 tablet 5  . triamcinolone cream (KENALOG) 0.1 % Apply 1 application topically as directed.    Marland Kitchen Umeclidinium  Bromide (INCRUSE ELLIPTA IN) Inhale into the lungs as directed.    . valACYclovir (VALTREX) 1000 MG tablet TAKE 1 TABLET BY MOUTH 3 TIMES DAILY. 21 tablet 5  . amoxicillin-clavulanate (AUGMENTIN) 875-125 MG tablet Take 1 tablet by mouth 2 (two) times daily for 7 days. Take with meals 14 tablet 0   No current facility-administered medications for this visit.     Allergies  Azithromycin  Electrocardiogram:   SR rate 102 LVH  03/25/13  05/02/14  SR rate 84 LVH no sig change  07/12/15 ST rate 100 LVH  08/08/16 SR rate 86 LVH   Assessment and Plan CAD/CABG:  X 2 last 2011  Patent grafts to LAD/OM and RCA by cath 08/12/16  Agree with Dr Excell Seltzer TAVR would be better than  3rd sternotomy Stable See below regarding TAVR  AS/AR:  Likely severe AS mean gradient 33-41 mmHg echo less by cath with plus 3 AR. Seen by Dr Cornelius Moras July 2018 reluctant To pursue TAVR. Pelvic anatomy small for delivery as well will order f/u echo in July Currently agree On waiting to pursue intervention   HIV:  meds adjusted by ID need to be careful about statin interaction   COPD:  Indicates smoking cessation contributes to dyspnea  Bipolar:  On Abilify mood stable less stressed since on disability  Chol:  Now on crestor as HIV med changed  Doubt leg pain from this but will have him take it qod for 3 weeks and see if helps Lab Results  Component Value Date   LDLCALC 97 03/26/2017   Bruit:  F/u duplex ASA duplex  08/03/15 had plaque no stenosis   ENT:  Has deviated septum with frequent severe sinus infections will refer back to ENT for evaluation  Script for Augmentin called in for 7 days     Charlton Haws

## 2017-06-22 ENCOUNTER — Other Ambulatory Visit: Payer: Medicare Other

## 2017-06-23 ENCOUNTER — Encounter: Payer: Self-pay | Admitting: Cardiovascular Disease

## 2017-06-23 ENCOUNTER — Ambulatory Visit (INDEPENDENT_AMBULATORY_CARE_PROVIDER_SITE_OTHER): Payer: Medicare Other | Admitting: Cardiovascular Disease

## 2017-06-23 VITALS — BP 120/62 | HR 79 | Ht 68.0 in | Wt 167.2 lb

## 2017-06-23 DIAGNOSIS — Z951 Presence of aortocoronary bypass graft: Secondary | ICD-10-CM | POA: Diagnosis not present

## 2017-06-23 DIAGNOSIS — E782 Mixed hyperlipidemia: Secondary | ICD-10-CM | POA: Diagnosis not present

## 2017-06-23 DIAGNOSIS — R0989 Other specified symptoms and signs involving the circulatory and respiratory systems: Secondary | ICD-10-CM

## 2017-06-23 DIAGNOSIS — I350 Nonrheumatic aortic (valve) stenosis: Secondary | ICD-10-CM

## 2017-06-23 DIAGNOSIS — R011 Cardiac murmur, unspecified: Secondary | ICD-10-CM

## 2017-06-23 MED ORDER — AMOXICILLIN-POT CLAVULANATE 875-125 MG PO TABS: 1.0000 | ORAL_TABLET | Freq: Two times a day (BID) | ORAL | 0 refills | Status: AC

## 2017-06-23 NOTE — Patient Instructions (Addendum)
Medication Instructions:  Your physician has recommended you make the following change in your medication:  1-TAKE Augmentin 875-125 mg by mouth twice daily for 7 days.   Labwork: NONE  Testing/Procedures: Your physician has requested that you have an echocardiogram. Echocardiography is a painless test that uses sound waves to create images of your heart. It provides your doctor with information about the size and shape of your heart and how well your heart's chambers and valves are working. This procedure takes approximately one hour. There are no restrictions for this procedure.  Follow-Up: Your physician wants you to follow-up in: July with Dr. Eden EmmsNishan.   If you need a refill on your cardiac medications before your next appointment, please call your pharmacy.

## 2017-07-07 ENCOUNTER — Ambulatory Visit: Payer: Medicare Other | Admitting: Internal Medicine

## 2017-07-27 ENCOUNTER — Other Ambulatory Visit: Payer: Medicare Other

## 2017-07-28 ENCOUNTER — Other Ambulatory Visit: Payer: Medicare Other

## 2017-07-28 DIAGNOSIS — R37 Sexual dysfunction, unspecified: Secondary | ICD-10-CM | POA: Diagnosis not present

## 2017-07-28 DIAGNOSIS — B20 Human immunodeficiency virus [HIV] disease: Secondary | ICD-10-CM

## 2017-07-29 ENCOUNTER — Other Ambulatory Visit: Payer: Medicare Other

## 2017-07-29 LAB — T-HELPER CELL (CD4) - (RCID CLINIC ONLY)
CD4 % Helper T Cell: 37 % (ref 33–55)
CD4 T Cell Abs: 1040 /uL (ref 400–2700)

## 2017-07-30 LAB — COMPLETE METABOLIC PANEL WITH GFR
AG Ratio: 1.5 (calc) (ref 1.0–2.5)
ALT: 15 U/L (ref 9–46)
AST: 18 U/L (ref 10–35)
Albumin: 4.3 g/dL (ref 3.6–5.1)
Alkaline phosphatase (APISO): 60 U/L (ref 40–115)
BUN: 14 mg/dL (ref 7–25)
CO2: 25 mmol/L (ref 20–32)
Calcium: 9.7 mg/dL (ref 8.6–10.3)
Chloride: 103 mmol/L (ref 98–110)
Creat: 1.21 mg/dL (ref 0.70–1.33)
GFR, Est African American: 77 mL/min/{1.73_m2} (ref >=60)
GFR, Est Non African American: 67 mL/min/{1.73_m2} (ref >=60)
Globulin: 2.8 g/dL (calc) (ref 1.9–3.7)
Glucose, Bld: 92 mg/dL (ref 65–99)
Potassium: 4.9 mmol/L (ref 3.5–5.3)
Sodium: 138 mmol/L (ref 135–146)
Total Bilirubin: 0.7 mg/dL (ref 0.2–1.2)
Total Protein: 7.1 g/dL (ref 6.1–8.1)

## 2017-07-30 LAB — CBC WITH DIFFERENTIAL/PLATELET
Basophils Absolute: 33 cells/uL (ref 0–200)
Basophils Relative: 0.3 %
Eosinophils Absolute: 78 cells/uL (ref 15–500)
Eosinophils Relative: 0.7 %
HCT: 47.6 % (ref 38.5–50.0)
Hemoglobin: 16.6 g/dL (ref 13.2–17.1)
Lymphs Abs: 2720 cells/uL (ref 850–3900)
MCH: 33.5 pg — ABNORMAL HIGH (ref 27.0–33.0)
MCHC: 34.9 g/dL (ref 32.0–36.0)
MCV: 96 fL (ref 80.0–100.0)
MPV: 10.3 fL (ref 7.5–12.5)
Monocytes Relative: 4.7 %
Neutro Abs: 7748 cells/uL (ref 1500–7800)
Neutrophils Relative %: 69.8 %
Platelets: 233 10*3/uL (ref 140–400)
RBC: 4.96 10*6/uL (ref 4.20–5.80)
RDW: 11.9 % (ref 11.0–15.0)
Total Lymphocyte: 24.5 %
WBC mixed population: 522 cells/uL (ref 200–950)
WBC: 11.1 10*3/uL — ABNORMAL HIGH (ref 3.8–10.8)

## 2017-07-30 LAB — TESTOSTERONE TOTAL,FREE,BIO, MALES
Albumin: 4.3 g/dL (ref 3.6–5.1)
Sex Hormone Binding: 42 nmol/L (ref 22–77)
Testosterone, Bioavailable: 97.4 ng/dL — ABNORMAL LOW (ref >=110.0)
Testosterone, Free: 49.5 pg/mL (ref 46.0–224.0)
Testosterone: 452 ng/dL (ref 250–827)

## 2017-07-30 LAB — HIV-1 RNA QUANT-NO REFLEX-BLD
HIV 1 RNA Quant: <20 copies/mL
HIV-1 RNA Quant, Log: <1.3 Log copies/mL

## 2017-08-12 ENCOUNTER — Ambulatory Visit (INDEPENDENT_AMBULATORY_CARE_PROVIDER_SITE_OTHER): Payer: Medicare Other | Admitting: Internal Medicine

## 2017-08-12 VITALS — BP 171/82 | HR 92 | Temp 97.7°F | Wt 166.0 lb

## 2017-08-12 DIAGNOSIS — Z79899 Other long term (current) drug therapy: Secondary | ICD-10-CM

## 2017-08-12 DIAGNOSIS — F411 Generalized anxiety disorder: Secondary | ICD-10-CM | POA: Diagnosis not present

## 2017-08-12 DIAGNOSIS — F900 Attention-deficit hyperactivity disorder, predominantly inattentive type: Secondary | ICD-10-CM | POA: Diagnosis not present

## 2017-08-12 DIAGNOSIS — I2510 Atherosclerotic heart disease of native coronary artery without angina pectoris: Secondary | ICD-10-CM | POA: Diagnosis not present

## 2017-08-12 DIAGNOSIS — B20 Human immunodeficiency virus [HIV] disease: Secondary | ICD-10-CM | POA: Diagnosis present

## 2017-08-12 DIAGNOSIS — Z125 Encounter for screening for malignant neoplasm of prostate: Secondary | ICD-10-CM | POA: Diagnosis not present

## 2017-08-12 DIAGNOSIS — K219 Gastro-esophageal reflux disease without esophagitis: Secondary | ICD-10-CM | POA: Diagnosis not present

## 2017-08-12 DIAGNOSIS — E782 Mixed hyperlipidemia: Secondary | ICD-10-CM | POA: Diagnosis not present

## 2017-08-12 DIAGNOSIS — Z0000 Encounter for general adult medical examination without abnormal findings: Secondary | ICD-10-CM | POA: Diagnosis not present

## 2017-08-12 DIAGNOSIS — F3181 Bipolar II disorder: Secondary | ICD-10-CM | POA: Diagnosis not present

## 2017-08-12 DIAGNOSIS — M549 Dorsalgia, unspecified: Secondary | ICD-10-CM | POA: Diagnosis not present

## 2017-08-12 DIAGNOSIS — J449 Chronic obstructive pulmonary disease, unspecified: Secondary | ICD-10-CM | POA: Diagnosis not present

## 2017-08-12 DIAGNOSIS — I5022 Chronic systolic (congestive) heart failure: Secondary | ICD-10-CM | POA: Diagnosis not present

## 2017-08-12 NOTE — Progress Notes (Signed)
RFV: follow up for hiv diseae  Patient ID: [name] Smith, male   DOB: 1961-01-12, 57 y.o.   MRN: [id number]  HPI 57yo M with HIV disease, Cd 4 count of 1020/VL<20 on biktarvy. Doing well. Good adherence. He is not having any worsening of his other co-morbidiites since we last saw him. No complaints of health issues today. He does mention some increase stress since his mother recently fractured her leg and now in SNF.   Outpatient Encounter Medications as of 08/12/2017  Medication Sig  . albuterol (PROVENTIL HFA;VENTOLIN HFA) 108 (90 Base) MCG/ACT inhaler Inhale 2 puffs into the lungs every 6 (six) hours as needed for wheezing or shortness of breath.  . amphetamine-dextroamphetamine (ADDERALL XR) 20 MG 24 hr capsule TAKE 1 CAPSULE BY MOUTH EVERY DAY IN THE MORNING  . ARIPiprazole (ABILIFY) 15 MG tablet TAKE 1 TABLET BY MOUTH EVERY DAY  . aspirin EC 81 MG tablet Take 1 tablet (81 mg total) by mouth daily.  . bictegravir-emtricitabine-tenofovir AF (BIKTARVY) 50-200-25 MG TABS tablet Take 1 tablet by mouth daily.  Marland Kitchen buPROPion (WELLBUTRIN) 100 MG tablet Take 1 tablet (100 mg total) by mouth daily.  . clopidogrel (PLAVIX) 75 MG tablet Take 1 tablet (75 mg total) by mouth daily.  . fluticasone (FLONASE) 50 MCG/ACT nasal spray Place 1 spray into both nostrils daily.  Marland Kitchen loratadine (CLARITIN) 10 MG tablet Take 1 tablet (10 mg total) by mouth daily.  . metoprolol succinate (TOPROL-XL) 50 MG 24 hr tablet TAKE 1 TABLET BY MOUTH DAILY. TAKE WITH OR IMMEDIATELY FOLLOWING A MEAL.  . Multiple Vitamin (MULTIVITAMIN WITH MINERALS) TABS tablet Take 1 tablet by mouth daily.  . nitroGLYCERIN (NITROSTAT) 0.4 MG SL tablet Place 1 tablet (0.4 mg total) under the tongue every 5 (five) minutes as needed. For chest pain -- may repeat times three  . ranitidine (ZANTAC) 150 MG tablet TAKE 1 TABLET BY MOUTH TWICE A DAY  . rosuvastatin (CRESTOR) 20 MG tablet TAKE 1 TABLET BY MOUTH EVERY DAY  . sildenafil (REVATIO) 20 MG  tablet TAKE 1 TO 5 TABLETS AS NEEDED FOR ED  . tiotropium (SPIRIVA) 18 MCG inhalation capsule Place 18 mcg into inhaler and inhale daily.  . traMADol (ULTRAM) 50 MG tablet TAKE 1 TABLET BY MOUTH EVERY 6 HOURS AS NEEDED FOR MODERATE TO SEVERE PAIN  . triamcinolone cream (KENALOG) 0.1 % Apply 1 application topically as directed.  Marland Kitchen Umeclidinium Bromide (INCRUSE ELLIPTA IN) Inhale into the lungs as directed.  . valACYclovir (VALTREX) 1000 MG tablet TAKE 1 TABLET BY MOUTH 3 TIMES DAILY.   No facility-administered encounter medications on file as of 08/12/2017.      Patient Active Problem List   Diagnosis Date Noted  . S/P CABG (coronary artery bypass graft)   . Severe aortic stenosis 08/12/2016  . Asthmatic bronchitis with acute exacerbation 01/03/2014  . Encounter for disability assessment 06/08/2013  . Murmur 10/14/2011  . URI (upper respiratory infection) 04/29/2011  . Bruit 10/28/2010  . S/P redo CABG x 4 12/28/2009  . SMOKER 11/06/2008  . COUGH 11/06/2008  . HYPERCHOLESTEROLEMIA 11/03/2008  . ANXIETY 11/03/2008  . Essential hypertension 11/03/2008  . GERD 11/03/2008  . NEPHROLITHIASIS 11/03/2008  . HIV INFECTION 03/20/2006  . CORONARY ARTERY DISEASE 03/20/2006     Health Maintenance Due  Topic Date Due  . COLONOSCOPY  03/05/2011     Review of Systems Review of Systems  Constitutional: Negative for fever, chills, diaphoresis, activity change, appetite change, fatigue and unexpected weight  change.  HENT: Negative for congestion, sore throat, rhinorrhea, sneezing, trouble swallowing and sinus pressure.  Eyes: Negative for photophobia and visual disturbance.  Respiratory: Negative for cough, chest tightness, shortness of breath, wheezing and stridor.  Cardiovascular: Negative for chest pain, palpitations and leg swelling.  Gastrointestinal: Negative for nausea, vomiting, abdominal pain, diarrhea, constipation, blood in stool, abdominal distention and anal bleeding.    Genitourinary: Negative for dysuria, hematuria, flank pain and difficulty urinating.  Musculoskeletal: Negative for myalgias, back pain, joint swelling, arthralgias and gait problem.  Skin: Negative for color change, pallor, rash and wound.  Neurological: Negative for dizziness, tremors, weakness and light-headedness.  Hematological: Negative for adenopathy. Does not bruise/bleed easily.  Psychiatric/Behavioral: Negative for behavioral problems, confusion, sleep disturbance, dysphoric mood, decreased concentration and agitation.   Social History   Tobacco Use  . Smoking status: Former Smoker    Packs/day: 1.00    Years: 36.00    Pack years: 36.00    Types: Cigarettes    Last attempt to quit: 12/12/2013    Years since quitting: 3.6  . Smokeless tobacco: Never Used  Substance Use Topics  . Alcohol use: Yes    Alcohol/week: 4.2 oz    Types: 7 Standard drinks or equivalent per week    Comment: wine  . Drug use: No    Physical Exam   BP (!) 171/82   Pulse 92   Temp 97.7 F (36.5 C) (Oral)   Wt 166 lb (75.3 kg)   BMI 25.24 kg/m   Physical Exam  Constitutional: He is oriented to person, place, and time. He appears well-developed and well-nourished. No distress.  HENT:  Mouth/Throat: Oropharynx is clear and moist. No oropharyngeal exudate.  Cardiovascular: Normal rate, regular rhythm and normal heart sounds. Exam reveals no gallop and no friction rub.  No murmur heard.  Pulmonary/Chest: Effort normal and breath sounds normal. No respiratory distress. He has no wheezes.  Lymphadenopathy:  He has no cervical adenopathy.  Neurological: He is alert and oriented to person, place, and time.  Skin: Skin is warm and dry. No rash noted. No erythema.  Psychiatric: He has a normal mood and affect. His behavior is normal.    Lab Results  Component Value Date   CD4TCELL 37 07/28/2017   Lab Results  Component Value Date   CD4TABS 1,040 07/28/2017   CD4TABS 1,430 03/26/2017    CD4TABS 1,300 01/06/2017   Lab Results  Component Value Date   HIV1RNAQUANT <20 NOT DETECTED 07/28/2017   Lab Results  Component Value Date   HEPBSAB REACTIVE (A) 01/06/2017   Lab Results  Component Value Date   LABRPR NON-REACTIVE 05/15/2017    CBC Lab Results  Component Value Date   WBC 11.1 (H) 07/28/2017   RBC 4.96 07/28/2017   HGB 16.6 07/28/2017   HCT 47.6 07/28/2017   PLT 233 07/28/2017   MCV 96.0 07/28/2017   MCH 33.5 (H) 07/28/2017   MCHC 34.9 07/28/2017   RDW 11.9 07/28/2017   LYMPHSABS 2,720 07/28/2017   MONOABS 528 09/02/2016   EOSABS 78 07/28/2017    BMET Lab Results  Component Value Date   NA 138 07/28/2017   K 4.9 07/28/2017   CL 103 07/28/2017   CO2 25 07/28/2017   GLUCOSE 92 07/28/2017   BUN 14 07/28/2017   CREATININE 1.21 07/28/2017   CALCIUM 9.7 07/28/2017   GFRNONAA 67 07/28/2017   GFRAA 77 07/28/2017      Assessment and Plan  hiv disease = well controlled,  continue on current regimen  Long term medication monitoring = creatinine is stable. No need to change regimen at this time  health screening = patient is not due for colonoscopy until 60

## 2017-08-14 ENCOUNTER — Other Ambulatory Visit: Payer: Self-pay | Admitting: Internal Medicine

## 2017-08-14 DIAGNOSIS — E785 Hyperlipidemia, unspecified: Secondary | ICD-10-CM

## 2017-08-25 ENCOUNTER — Other Ambulatory Visit: Payer: Self-pay

## 2017-08-25 ENCOUNTER — Ambulatory Visit (HOSPITAL_COMMUNITY): Payer: Medicare Other | Attending: Cardiovascular Disease

## 2017-08-25 DIAGNOSIS — Z951 Presence of aortocoronary bypass graft: Secondary | ICD-10-CM | POA: Diagnosis not present

## 2017-08-25 DIAGNOSIS — I350 Nonrheumatic aortic (valve) stenosis: Secondary | ICD-10-CM

## 2017-08-25 DIAGNOSIS — I352 Nonrheumatic aortic (valve) stenosis with insufficiency: Secondary | ICD-10-CM | POA: Insufficient documentation

## 2017-08-25 DIAGNOSIS — I2510 Atherosclerotic heart disease of native coronary artery without angina pectoris: Secondary | ICD-10-CM | POA: Insufficient documentation

## 2017-08-26 ENCOUNTER — Other Ambulatory Visit: Payer: Self-pay | Admitting: Physician Assistant

## 2017-08-26 DIAGNOSIS — R030 Elevated blood-pressure reading, without diagnosis of hypertension: Secondary | ICD-10-CM | POA: Diagnosis not present

## 2017-08-26 DIAGNOSIS — L720 Epidermal cyst: Secondary | ICD-10-CM | POA: Diagnosis not present

## 2017-09-07 NOTE — Progress Notes (Signed)
Patient ID: [name] Smith, male   DOB: Jul 24, 1960, 57 y.o.   MRN: [id number]   [name] Smith is a compliated 57 yo patient seen in f/u for CAD and AV disease.  He has already had two CABG;s most recently in October 2011 EF 45% range. He has HIV followed by Dr [name] Smith with chronic sinus congestion and deviated septum. On crestor for HLD. Bipolar with depression and on disability. 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 Clinical COPD still smoking. He has had progressive AV disease. Seen by Dr [name] Smith June 2018 agreed he is not a candidate for 3rd sternotomy Living off grafts and no vein conduits for repair. Last echo reviewed and LV is down at 40-45% with lower gardients mean 27 mmHg and peak 59 mmHg   Had right and left cath with Dr [name] Seltzerooper on 08/12/16 Reviewed Mixed AS/AR mean gradient only 20 mmHg but 3/4 grade AR Seen by Dr [name] Smith July 2018 and TAVR postponed due to stable symptoms and patient not willing to commit Concern for EF 45-50%  Sized to 23 mm Sapien with questionable femoral access Seen by Dr [name] Smith in July and patient reluctant to pursue   Denies dyspnea chest pain palpitations syncope or edema. Long discussion about timing Of TAVR. Not clear that he can be done trans femorally Concerns about longevity of Stented valve since he is young. Currently asymptomatic. Discussed issues with decreasing EF  Cardiac CTA reviewed 09/18/16 and annular area 418 suitable for a 23 mm Sapien  CT abdomen pelvis read by radiology as not suitable for trans femoral delivery   Working out less at [employer] as his mom is at rehab after a broken Femur and his dad is also elderly ans needs help  Conclusion   1. Severe native coronary artery disease with severe stenosis of the left main and total occlusion of the LAD, left circumflex, and right coronary arteries 2. Status post redo CABG with continued patency of the LIMA to LAD, saphenous vein graft OM, and sequential saphenous vein graft to PDA and PLA branches. All other  saphenous vein grafts are occluded. 3. Mixed aortic valve disease with aortic stenosis and 3+ insufficiency 4. Normal right heart hemodynamics  Recommendations: Will review case with Dr. Eden Smith. The patient's aortic valve gradients are less by cardiac catheterization than that of his echo study. However, his aortic insufficiency is more impressive by aortic root angiography. I suspect he has mixed disease with an increase in his gradient related to flow. His aortic valve area is overestimated by this study because of a high cardiac output based on Fick calculation.      ROS: Denies fever, malais, weight loss, blurry vision, decreased visual acuity, cough, sputum, SOB, hemoptysis, pleuritic pain, palpitaitons, heartburn, abdominal pain, melena, lower extremity edema, claudication, or rash.  All other systems reviewed and negative  General: BP (!) 142/82   Pulse 86   Ht 5\' 8"  (1.727 m)   Wt 166 lb (75.3 kg)   BMI 25.24 kg/m  Affect appropriate Chronically ill thin white male  HEENT: deviated septum  Neck supple with no adenopathy JVP normal no bruits no thyromegaly Lungs COPD decreased BS no wheezing and good diaphragmatic motion Heart:  S1/S2 no murmur, no rub, gallop or click PMI normal Abdomen: benighn, BS positve, no tenderness, no AAA no bruit.  No HSM or HJR Distal pulses intact with no bruits No edema No venous conduits for bypass left  Neuro non-focal Skin warm and dry No muscular  weakness    Current Outpatient Medications  Medication Sig Dispense Refill  . albuterol (PROVENTIL HFA;VENTOLIN HFA) 108 (90 Base) MCG/ACT inhaler Inhale 2 puffs into the lungs every 6 (six) hours as needed for wheezing or shortness of breath. 1 Inhaler 6  . amphetamine-dextroamphetamine (ADDERALL XR) 20 MG 24 hr capsule TAKE 1 CAPSULE BY MOUTH EVERY DAY IN THE MORNING  0  . ARIPiprazole (ABILIFY) 15 MG tablet TAKE 1 TABLET BY MOUTH EVERY DAY 90 tablet 1  . aspirin EC 81 MG tablet Take 1  tablet (81 mg total) by mouth daily. 90 tablet 3  . bictegravir-emtricitabine-tenofovir AF (BIKTARVY) 50-200-25 MG TABS tablet Take 1 tablet by mouth daily. 30 tablet 11  . buPROPion (WELLBUTRIN) 100 MG tablet Take 1 tablet (100 mg total) by mouth daily. 30 tablet 3  . clopidogrel (PLAVIX) 75 MG tablet Take 1 tablet (75 mg total) by mouth daily. 30 tablet 8  . fluticasone (FLONASE) 50 MCG/ACT nasal spray Place 1 spray into both nostrils daily.    . INCRUSE ELLIPTA 62.5 MCG/INH AEPB TAKE 1 PUFF EVERY DAY 30 each 3  . loratadine (CLARITIN) 10 MG tablet Take 1 tablet (10 mg total) by mouth daily. 30 tablet 6  . metoprolol succinate (TOPROL-XL) 50 MG 24 hr tablet TAKE 1 TABLET BY MOUTH DAILY. TAKE WITH OR IMMEDIATELY FOLLOWING A MEAL. 90 tablet 1  . Multiple Vitamin (MULTIVITAMIN WITH MINERALS) TABS tablet Take 1 tablet by mouth daily.    . nitroGLYCERIN (NITROSTAT) 0.4 MG SL tablet Place 1 tablet (0.4 mg total) under the tongue every 5 (five) minutes as needed. For chest pain -- may repeat times three 25 tablet 11  . ranitidine (ZANTAC) 150 MG tablet TAKE 1 TABLET BY MOUTH TWICE A DAY 60 tablet 0  . rosuvastatin (CRESTOR) 20 MG tablet TAKE 1 TABLET BY MOUTH EVERY DAY 30 tablet 5  . sildenafil (REVATIO) 20 MG tablet TAKE 1 TO 5 TABLETS AS NEEDED FOR ED  3  . tiotropium (SPIRIVA) 18 MCG inhalation capsule Place 18 mcg into inhaler and inhale daily.    . traMADol (ULTRAM) 50 MG tablet TAKE 1 TABLET BY MOUTH EVERY 6 HOURS AS NEEDED FOR MODERATE TO SEVERE PAIN 30 tablet 5  . triamcinolone cream (KENALOG) 0.1 % Apply 1 application topically as directed.    Marland Kitchen Umeclidinium Bromide (INCRUSE ELLIPTA IN) Inhale into the lungs as directed.    . valACYclovir (VALTREX) 1000 MG tablet TAKE 1 TABLET BY MOUTH 3 TIMES DAILY. 21 tablet 5   No current facility-administered medications for this visit.     Allergies  Azithromycin  Electrocardiogram:   09/08/17 SR rate 86 nonspecific ST changes   Assessment and  Plan CAD/CABG:  X 2 last 2011  Patent grafts to LAD/OM and RCA by cath 08/12/16  Agree with Dr [name] Smith TAVR would be better than  3rd sternotomy Stable See below regarding TAVR   AS/AR:  TTE done 08/25/17 EF 40-45% mean gradient lower 27 mmHg and peak 59 mmHg Discussed fact that LV appears to be failing and he has grade 3/4 AR   HIV:  meds adjusted by ID need to be careful about statin interaction   COPD:  Indicates smoking cessation contributes to dyspnea  Bipolar:  On Abilify mood stable less stressed since on disability  Chol:  Now on crestor as HIV med changed  Doubt leg pain from this but will have him take it qod for 3 weeks and see if helps Lab Results  Component Value Date   LDLCALC 97 03/26/2017   Bruit:  F/u duplex ASA duplex  08/03/15 had plaque no stenosis   ENT:  Has deviated septum with frequent severe sinus infections improved      [name] Smith

## 2017-09-08 ENCOUNTER — Encounter: Payer: Self-pay | Admitting: Cardiovascular Disease

## 2017-09-08 ENCOUNTER — Ambulatory Visit (INDEPENDENT_AMBULATORY_CARE_PROVIDER_SITE_OTHER): Payer: Medicare Other | Admitting: Cardiovascular Disease

## 2017-09-08 VITALS — BP 142/82 | HR 86 | Ht 68.0 in | Wt 166.0 lb

## 2017-09-08 DIAGNOSIS — E782 Mixed hyperlipidemia: Secondary | ICD-10-CM

## 2017-09-08 DIAGNOSIS — I350 Nonrheumatic aortic (valve) stenosis: Secondary | ICD-10-CM | POA: Diagnosis not present

## 2017-09-08 DIAGNOSIS — Z951 Presence of aortocoronary bypass graft: Secondary | ICD-10-CM

## 2017-09-08 NOTE — Patient Instructions (Signed)

## 2017-09-09 ENCOUNTER — Other Ambulatory Visit: Payer: Self-pay | Admitting: Behavioral Health

## 2017-09-09 DIAGNOSIS — E785 Hyperlipidemia, unspecified: Secondary | ICD-10-CM

## 2017-09-28 ENCOUNTER — Ambulatory Visit: Payer: Medicare Other | Admitting: Thoracic Surgery (Cardiothoracic Vascular Surgery)

## 2017-09-28 ENCOUNTER — Other Ambulatory Visit: Payer: Self-pay | Admitting: Cardiovascular Disease

## 2017-10-19 ENCOUNTER — Ambulatory Visit: Payer: Medicare Other | Admitting: Thoracic Surgery (Cardiothoracic Vascular Surgery)

## 2017-11-03 ENCOUNTER — Other Ambulatory Visit: Payer: Self-pay | Admitting: Internal Medicine

## 2017-11-03 DIAGNOSIS — I10 Essential (primary) hypertension: Secondary | ICD-10-CM

## 2017-11-30 ENCOUNTER — Encounter: Payer: Medicare Other | Admitting: Internal Medicine

## 2017-11-30 ENCOUNTER — Other Ambulatory Visit: Payer: Medicare Other

## 2017-11-30 ENCOUNTER — Other Ambulatory Visit: Payer: Self-pay | Admitting: Behavioral Health

## 2017-11-30 DIAGNOSIS — B20 Human immunodeficiency virus [HIV] disease: Secondary | ICD-10-CM

## 2017-12-01 LAB — T-HELPER CELL (CD4) - (RCID CLINIC ONLY)
CD4 % Helper T Cell: 44 % (ref 33–55)
CD4 T Cell Abs: 1520 /uL (ref 400–2700)

## 2017-12-02 LAB — COMPREHENSIVE METABOLIC PANEL
AG Ratio: 1.4 (calc) (ref 1.0–2.5)
ALT: 17 U/L (ref 9–46)
AST: 21 U/L (ref 10–35)
Albumin: 3.9 g/dL (ref 3.6–5.1)
Alkaline phosphatase (APISO): 54 U/L (ref 40–115)
BUN: 16 mg/dL (ref 7–25)
CO2: 28 mmol/L (ref 20–32)
Calcium: 9.9 mg/dL (ref 8.6–10.3)
Chloride: 102 mmol/L (ref 98–110)
Creat: 1.13 mg/dL (ref 0.70–1.33)
Globulin: 2.8 g/dL (calc) (ref 1.9–3.7)
Glucose, Bld: 118 mg/dL — ABNORMAL HIGH (ref 65–99)
Potassium: 4.5 mmol/L (ref 3.5–5.3)
Sodium: 139 mmol/L (ref 135–146)
Total Bilirubin: 0.5 mg/dL (ref 0.2–1.2)
Total Protein: 6.7 g/dL (ref 6.1–8.1)

## 2017-12-02 LAB — CBC WITH DIFFERENTIAL/PLATELET
Basophils Absolute: 40 cells/uL (ref 0–200)
Basophils Relative: 0.5 %
Eosinophils Absolute: 72 cells/uL (ref 15–500)
Eosinophils Relative: 0.9 %
HCT: 45.8 % (ref 38.5–50.0)
Hemoglobin: 16.2 g/dL (ref 13.2–17.1)
Lymphs Abs: 3488 cells/uL (ref 850–3900)
MCH: 33.9 pg — ABNORMAL HIGH (ref 27.0–33.0)
MCHC: 35.4 g/dL (ref 32.0–36.0)
MCV: 95.8 fL (ref 80.0–100.0)
MPV: 10.3 fL (ref 7.5–12.5)
Monocytes Relative: 5.6 %
Neutro Abs: 3952 cells/uL (ref 1500–7800)
Neutrophils Relative %: 49.4 %
Platelets: 219 10*3/uL (ref 140–400)
RBC: 4.78 10*6/uL (ref 4.20–5.80)
RDW: 12.2 % (ref 11.0–15.0)
Total Lymphocyte: 43.6 %
WBC mixed population: 448 cells/uL (ref 200–950)
WBC: 8 10*3/uL (ref 3.8–10.8)

## 2017-12-02 LAB — HIV-1 RNA QUANT-NO REFLEX-BLD
HIV 1 RNA Quant: <20 copies/mL
HIV-1 RNA Quant, Log: <1.3 Log copies/mL

## 2017-12-07 ENCOUNTER — Other Ambulatory Visit: Payer: Self-pay | Admitting: Internal Medicine

## 2017-12-08 ENCOUNTER — Other Ambulatory Visit: Payer: Self-pay | Admitting: Internal Medicine

## 2017-12-08 DIAGNOSIS — F329 Major depressive disorder, single episode, unspecified: Secondary | ICD-10-CM

## 2017-12-08 DIAGNOSIS — F32A Depression, unspecified: Secondary | ICD-10-CM

## 2017-12-10 ENCOUNTER — Other Ambulatory Visit: Payer: Self-pay | Admitting: Internal Medicine

## 2017-12-10 DIAGNOSIS — E785 Hyperlipidemia, unspecified: Secondary | ICD-10-CM

## 2017-12-14 ENCOUNTER — Encounter: Payer: Medicare Other | Admitting: Internal Medicine

## 2017-12-15 ENCOUNTER — Encounter: Payer: Self-pay | Admitting: Internal Medicine

## 2017-12-15 ENCOUNTER — Ambulatory Visit (INDEPENDENT_AMBULATORY_CARE_PROVIDER_SITE_OTHER): Payer: Medicare Other | Admitting: Internal Medicine

## 2017-12-15 VITALS — BP 131/75 | HR 84 | Temp 98.3°F | Ht 68.0 in | Wt 168.0 lb

## 2017-12-15 DIAGNOSIS — Z79899 Other long term (current) drug therapy: Secondary | ICD-10-CM

## 2017-12-15 DIAGNOSIS — J0100 Acute maxillary sinusitis, unspecified: Secondary | ICD-10-CM | POA: Diagnosis not present

## 2017-12-15 DIAGNOSIS — B20 Human immunodeficiency virus [HIV] disease: Secondary | ICD-10-CM | POA: Diagnosis not present

## 2017-12-15 NOTE — Patient Instructions (Signed)
Call if no improvement in 5-7  Days--  Until then try mucinex and ibuprofen 600-800mg   for symptom relief

## 2017-12-15 NOTE — Progress Notes (Signed)
RFV: follow up for hiv disease  Patient ID: [name] Smith, male   DOB: [birth year], 57 y.o.   MRN: [id number]  HPI 57yo M with hiv disease, CAD, COPD and seasonal allergies. He reports of the last few days having increasing sinus pressure for which he has been using Claritin, netty pot, post- , 4 days ago - nasal spray without significant relief. "I think I need abtx for sinus infection" Outpatient Encounter Medications as of 12/15/2017  Medication Sig  . albuterol (PROVENTIL HFA;VENTOLIN HFA) 108 (90 Base) MCG/ACT inhaler Inhale 2 puffs into the lungs every 6 (six) hours as needed for wheezing or shortness of breath.  . amphetamine-dextroamphetamine (ADDERALL XR) 20 MG 24 hr capsule TAKE 1 CAPSULE BY MOUTH EVERY DAY IN THE MORNING  . ARIPiprazole (ABILIFY) 15 MG tablet TAKE 1 TABLET BY MOUTH EVERY DAY  . aspirin EC 81 MG tablet Take 1 tablet (81 mg total) by mouth daily.  . bictegravir-emtricitabine-tenofovir AF (BIKTARVY) 50-200-25 MG TABS tablet Take 1 tablet by mouth daily.  Marland Kitchen buPROPion (WELLBUTRIN) 100 MG tablet Take 1 tablet (100 mg total) by mouth daily.  . clopidogrel (PLAVIX) 75 MG tablet TAKE 1 TABLET BY MOUTH EVERY DAY  . fluticasone (FLONASE) 50 MCG/ACT nasal spray Place 1 spray into both nostrils daily.  . INCRUSE ELLIPTA 62.5 MCG/INH AEPB TAKE 1 PUFF EVERY DAY  . loratadine (CLARITIN) 10 MG tablet Take 1 tablet (10 mg total) by mouth daily.  . metoprolol succinate (TOPROL-XL) 50 MG 24 hr tablet TAKE 1 TABLET BY MOUTH DAILY. TAKE WITH OR IMMEDIATELY FOLLOWING A MEAL.  . Multiple Vitamin (MULTIVITAMIN WITH MINERALS) TABS tablet Take 1 tablet by mouth daily.  . nitroGLYCERIN (NITROSTAT) 0.4 MG SL tablet Place 1 tablet (0.4 mg total) under the tongue every 5 (five) minutes as needed. For chest pain -- may repeat times three  . ranitidine (ZANTAC) 150 MG tablet TAKE 1 TABLET BY MOUTH TWICE A DAY  . rosuvastatin (CRESTOR) 20 MG tablet TAKE 1 TABLET BY MOUTH EVERY DAY  .  sildenafil (REVATIO) 20 MG tablet TAKE 1 TO 5 TABLETS AS NEEDED FOR ED  . tiotropium (SPIRIVA) 18 MCG inhalation capsule Place 18 mcg into inhaler and inhale daily.  . traMADol (ULTRAM) 50 MG tablet TAKE 1 TABLET BY MOUTH EVERY 6 HOURS AS NEEDED FOR MODERATE TO SEVERE PAIN  . triamcinolone cream (KENALOG) 0.1 % Apply 1 application topically as directed.  Marland Kitchen Umeclidinium Bromide (INCRUSE ELLIPTA IN) Inhale into the lungs as directed.  . valACYclovir (VALTREX) 1000 MG tablet TAKE 1 TABLET BY MOUTH 3 TIMES DAILY.   No facility-administered encounter medications on file as of 12/15/2017.      Patient Active Problem List   Diagnosis Date Noted  . S/P CABG (coronary artery bypass graft)   . Severe aortic stenosis 08/12/2016  . Asthmatic bronchitis with acute exacerbation 01/03/2014  . Encounter for disability assessment 06/08/2013  . Murmur 10/14/2011  . URI (upper respiratory infection) 04/29/2011  . Bruit 10/28/2010  . S/P redo CABG x 4 12/28/2009  . SMOKER 11/06/2008  . COUGH 11/06/2008  . HYPERCHOLESTEROLEMIA 11/03/2008  . ANXIETY 11/03/2008  . Essential hypertension 11/03/2008  . GERD 11/03/2008  . NEPHROLITHIASIS 11/03/2008  . HIV INFECTION 03/20/2006  . CORONARY ARTERY DISEASE 03/20/2006     Health Maintenance Due  Topic Date Due  . COLONOSCOPY  03/05/2011  . INFLUENZA VACCINE  10/08/2017     Review of Systems 12 point ros is negative except what is  mentioned in hpi Physical Exam   BP 131/75   Pulse 84   Temp 98.3 F (36.8 C) (Oral)   Ht 5\' 8"  (1.727 m)   Wt 168 lb (76.2 kg)   BMI 25.54 kg/m   Physical Exam  Constitutional: He is oriented to person, place, and time. He appears well-developed and well-nourished. No distress.  HENT: mild tenderness on palpation about sinuses though eyes not injected, nasal mucosa not boggy Mouth/Throat: Oropharynx is clear and moist. No oropharyngeal exudate.  Cardiovascular: Normal rate, regular rhythm and normal heart sounds. Exam  reveals no gallop and no friction rub.  No murmur heard.  Pulmonary/Chest: Effort normal and breath sounds normal. No respiratory distress. He has no wheezes.  Abdominal: Soft. Bowel sounds are normal. He exhibits no distension. There is no tenderness.  Lymphadenopathy:  He has no cervical adenopathy.  Neurological: He is alert and oriented to person, place, and time.  Skin: Skin is warm and dry. No rash noted. No erythema.  Psychiatric: He has a normal mood and affect. His behavior is normal.    Lab Results  Component Value Date   CD4TCELL 44 11/30/2017   Lab Results  Component Value Date   CD4TABS 1,520 11/30/2017   CD4TABS 1,040 07/28/2017   CD4TABS 1,430 03/26/2017   Lab Results  Component Value Date   HIV1RNAQUANT <20 NOT DETECTED 11/30/2017   Lab Results  Component Value Date   HEPBSAB REACTIVE (A) 01/06/2017   Lab Results  Component Value Date   LABRPR NON-REACTIVE 05/15/2017    CBC Lab Results  Component Value Date   WBC 8.0 11/30/2017   RBC 4.78 11/30/2017   HGB 16.2 11/30/2017   HCT 45.8 11/30/2017   PLT 219 11/30/2017   MCV 95.8 11/30/2017   MCH 33.9 (H) 11/30/2017   MCHC 35.4 11/30/2017   RDW 12.2 11/30/2017   LYMPHSABS 3,488 11/30/2017   MONOABS 528 09/02/2016   EOSABS 72 11/30/2017    BMET Lab Results  Component Value Date   NA 139 11/30/2017   K 4.5 11/30/2017   CL 102 11/30/2017   CO2 28 11/30/2017   GLUCOSE 118 (H) 11/30/2017   BUN 16 11/30/2017   CREATININE 1.13 11/30/2017   CALCIUM 9.9 11/30/2017   GFRNONAA 67 07/28/2017   GFRAA 77 07/28/2017      Assessment and Plan  hiv disease = well controlled. Continue on current regimen  ckd 2 with long term medication management= cr stable.  Sinusitis - ibuprofen and mucinex for now. If not improved in 7 days, then will reassess. Suspect this is viral  Health maintenance = Flu vaccine next visit -

## 2017-12-22 ENCOUNTER — Telehealth: Payer: Self-pay

## 2017-12-22 NOTE — Telephone Encounter (Signed)
Patient called office today stating he still has a sinus infection. Patient states he was told during his visit if he continues to experience symptoms to call office to have medication called into pharmacy. Patient states he would like [facility] [address]. Will route message to Dr. Drue Second.  [name] Smith, [place]

## 2017-12-22 NOTE — Telephone Encounter (Signed)
Can you send in amox/clav 875mg  bid x 5 days

## 2017-12-23 ENCOUNTER — Other Ambulatory Visit: Payer: Self-pay

## 2017-12-23 MED ORDER — AMOXICILLIN-POT CLAVULANATE 875-125 MG PO TABS: 1.0000 | ORAL_TABLET | Freq: Two times a day (BID) | ORAL | 0 refills | Status: AC

## 2017-12-23 NOTE — Telephone Encounter (Signed)
[facility] [address]. Patient is aware and will go to pharmacy later today. [name], [place]

## 2018-01-05 ENCOUNTER — Telehealth: Payer: Self-pay

## 2018-01-05 DIAGNOSIS — E785 Hyperlipidemia, unspecified: Secondary | ICD-10-CM

## 2018-01-05 MED ORDER — ROSUVASTATIN CALCIUM 20 MG PO TABS: 20.0000 mg | ORAL_TABLET | Freq: Every day | ORAL | 0 refills | Status: AC

## 2018-01-05 NOTE — Telephone Encounter (Signed)
Patient called to ask why his lipid has been changed. Or records do not indicate a refill has been sent to pharmacy.  The patient thinks maybe his primary care physician filled script.  He has requested we send Crestor .  Crestor was sent to pharmacy per patient request.   Laurell Josephs, RN

## 2018-02-01 NOTE — Progress Notes (Signed)
Patient ID: [name] Smith, male   DOB: [birth year], 57 y.o.   MRN: [id number]   [name] DanceKeith is a compliated 57 yo patient seen in f/u for CAD and AV disease.  He has already had two CABG;s most recently in October 2011 EF 45% range. He has HIV followed by [name] Smith with chronic sinus congestion and deviated septum. On crestor for HLD. Bipolar with depression and on disability. 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 Clinical COPD still smoking. He has had progressive AV disease. Seen by [name] Smith June 2018 agreed he is not a candidate for 3rd sternotomy Living off grafts and no vein conduits for repair. Last echo reviewed and LV is down at 40-45% with lower gardients mean 27 mmHg and peak 59 mmHg   Had right and left cath with [name] Smith on 08/12/16 Reviewed Mixed AS/AR mean gradient only 20 mmHg but 3/4 grade AR Seen by [name] Smith July 2018 and TAVR postponed due to stable symptoms and patient not willing to commit Concern for EF 45-50%  Sized to 23 mm Sapien with questionable femoral access Seen by [name] Smith in July and patient reluctant to pursue   Denies dyspnea chest pain palpitations syncope or edema. Long discussion about timing Of TAVR. Not clear that he can be done trans femorally Concerns about longevity of Stented valve since he is young. Currently asymptomatic. Discussed issues with decreasing EF  Cardiac CTA reviewed 09/18/16 and annular area 418 suitable for a 23 mm Sapien  CT abdomen pelvis read by radiology as not suitable for trans femoral delivery   Working out less at [employer] as his mom is at rehab after a broken Femur and his dad is also elderly ans needs help  TTE June 2019  EF 40-45% moderate AS/moderate AR DVI  .20 AVA .66 cm2  Mean gradient 27 mmHg peak 59 mmhg Gradients actually somewhat less than January 2019 mean 33 and peak 57 mmHg  No cardiac complaints Going to gym Has sinusitis    ROS: Denies fever, malais, weight loss, blurry vision, decreased visual acuity, cough, sputum,  SOB, hemoptysis, pleuritic pain, palpitaitons, heartburn, abdominal pain, melena, lower extremity edema, claudication, or rash.  All other systems reviewed and negative  General: BP 138/80   Pulse 76   Ht 5\' 8"  (1.727 m)   Wt 171 lb 8 oz (77.8 kg)   SpO2 96%   BMI 26.08 kg/m  Affect appropriate Chronically ill thin white male  HEENT: deviated septum  Neck supple with no adenopathy JVP normal no bruits no thyromegaly Lungs COPD decreased BS no wheezing and good diaphragmatic motion Heart:  S1/S2 no murmur, no rub, gallop or click PMI normal Abdomen: benighn, BS positve, no tenderness, no AAA no bruit.  No HSM or HJR Distal pulses intact with no bruits No edema No venous conduits for bypass left  Neuro non-focal Skin warm and dry No muscular weakness    Current Outpatient Medications  Medication Sig Dispense Refill  . albuterol (PROVENTIL HFA;VENTOLIN HFA) 108 (90 Base) MCG/ACT inhaler Inhale 2 puffs into the lungs every 6 (six) hours as needed for wheezing or shortness of breath. 1 Inhaler 6  . amphetamine-dextroamphetamine (ADDERALL XR) 20 MG 24 hr capsule TAKE 1 CAPSULE BY MOUTH EVERY DAY IN THE MORNING  0  . ARIPiprazole (ABILIFY) 15 MG tablet TAKE 1 TABLET BY MOUTH EVERY DAY 90 tablet 1  . aspirin EC 81 MG tablet Take 1 tablet (81 mg total) by mouth daily.  90 tablet 3  . bictegravir-emtricitabine-tenofovir AF (BIKTARVY) 50-200-25 MG TABS tablet Take 1 tablet by mouth daily. 30 tablet 11  . buPROPion (WELLBUTRIN) 100 MG tablet Take 1 tablet (100 mg total) by mouth daily. 30 tablet 3  . clopidogrel (PLAVIX) 75 MG tablet TAKE 1 TABLET BY MOUTH EVERY DAY 90 tablet 3  . elvitegravir-cobicistat-emtricitabine-tenofovir (GENVOYA) 150-150-200-10 MG TABS tablet Take 1 tablet by mouth as directed.    . fluticasone (FLONASE) 50 MCG/ACT nasal spray Place 1 spray into both nostrils daily.    . INCRUSE ELLIPTA 62.5 MCG/INH AEPB TAKE 1 PUFF EVERY DAY 30 each 3  . loratadine (CLARITIN) 10  MG tablet Take 1 tablet (10 mg total) by mouth daily. 30 tablet 6  . metoprolol succinate (TOPROL-XL) 50 MG 24 hr tablet TAKE 1 TABLET BY MOUTH DAILY. TAKE WITH OR IMMEDIATELY FOLLOWING A MEAL. 90 tablet 1  . Multiple Vitamin (MULTIVITAMIN WITH MINERALS) TABS tablet Take 1 tablet by mouth daily.    . nitroGLYCERIN (NITROSTAT) 0.4 MG SL tablet Place 1 tablet (0.4 mg total) under the tongue every 5 (five) minutes as needed. For chest pain -- may repeat times three 25 tablet 11  . ranitidine (ZANTAC) 150 MG tablet TAKE 1 TABLET BY MOUTH TWICE A DAY 60 tablet 0  . rosuvastatin (CRESTOR) 20 MG tablet Take 1 tablet (20 mg total) by mouth daily. 90 tablet 0  . sildenafil (REVATIO) 20 MG tablet TAKE 1 TO 5 TABLETS AS NEEDED FOR ED  3  . simvastatin (ZOCOR) 80 MG tablet Take 1 tablet by mouth every evening.  1  . tiotropium (SPIRIVA) 18 MCG inhalation capsule Place 18 mcg into inhaler and inhale daily.    . traMADol (ULTRAM) 50 MG tablet TAKE 1 TABLET BY MOUTH EVERY 6 HOURS AS NEEDED FOR MODERATE TO SEVERE PAIN 30 tablet 5  . triamcinolone cream (KENALOG) 0.1 % Apply 1 application topically as directed.    Marland Kitchen Umeclidinium Bromide (INCRUSE ELLIPTA IN) Inhale into the lungs as directed.    . valACYclovir (VALTREX) 1000 MG tablet TAKE 1 TABLET BY MOUTH 3 TIMES DAILY. 21 tablet 5   No current facility-administered medications for this visit.     Allergies  Azithromycin  Electrocardiogram:   09/08/17 SR rate 86 nonspecific ST changes   Assessment and Plan CAD/CABG:  X 2 last 2011  Patent grafts to LAD/OM and RCA by cath 08/12/16  Agree with [name] Excell Seltzer TAVR would be better than  3rd sternotomy Stable See below regarding TAVR   AS/AR:  TTE done 08/25/17 EF 40-45% mean gradient lower 27 mmHg and peak 59 mmHg Discussed fact that LV appears to be failing and he has grade 3/4 AR F/U TTE in June Patient Feels well and does not want to pursue TAVR as this time   HIV:  meds adjusted by ID need to be careful about  statin interaction   COPD:  Indicates smoking cessation contributes to dyspnea  Bipolar:  On Abilify mood stable less stressed since on disability  Chol:  Now on crestor as HIV med changed  Doubt leg pain from this but will have him take it qod for 3 weeks and see if helps Lab Results  Component Value Date   LDLCALC 97 03/26/2017   Bruit:  F/u duplex ASA duplex  08/03/15 had plaque no stenosis   ENT:  Has deviated septum with frequent sinus infections Augmentin called in for current episode     Charlton Haws

## 2018-02-08 ENCOUNTER — Ambulatory Visit (INDEPENDENT_AMBULATORY_CARE_PROVIDER_SITE_OTHER): Payer: Medicare Other | Admitting: Cardiovascular Disease

## 2018-02-08 ENCOUNTER — Encounter: Payer: Self-pay | Admitting: Cardiovascular Disease

## 2018-02-08 VITALS — BP 138/80 | HR 76 | Ht 68.0 in | Wt 171.5 lb

## 2018-02-08 DIAGNOSIS — E782 Mixed hyperlipidemia: Secondary | ICD-10-CM

## 2018-02-08 DIAGNOSIS — Z951 Presence of aortocoronary bypass graft: Secondary | ICD-10-CM

## 2018-02-08 DIAGNOSIS — I350 Nonrheumatic aortic (valve) stenosis: Secondary | ICD-10-CM | POA: Diagnosis not present

## 2018-02-08 DIAGNOSIS — R0989 Other specified symptoms and signs involving the circulatory and respiratory systems: Secondary | ICD-10-CM | POA: Diagnosis not present

## 2018-02-08 MED ORDER — AMOXICILLIN-POT CLAVULANATE 500-125 MG PO TABS: 1.0000 | ORAL_TABLET | Freq: Two times a day (BID) | ORAL | 1 refills | Status: DC

## 2018-02-08 NOTE — Patient Instructions (Signed)
Medication Instructions:   If you need a refill on your cardiac medications before your next appointment, please call your pharmacy.   Lab work:  If you have labs (blood work) drawn today and your tests are completely normal, you will receive your results only by: . MyChart Message (if you have MyChart) OR . A paper copy in the mail If you have any lab test that is abnormal or we need to change your treatment, we will call you to review the results.  Testing/Procedures: Your physician has requested that you have an echocardiogram in 6 months. Echocardiography is a painless test that uses sound waves to create images of your heart. It provides your doctor with information about the size and shape of your heart and how well your heart's chambers and valves are working. This procedure takes approximately one hour. There are no restrictions for this procedure.  Follow-Up: At [facility], you and your health needs are our priority.  As part of our continuing mission to provide you with exceptional heart care, we have created designated Provider Care Teams.  These Care Teams include your primary Cardiologist (physician) and Advanced Practice Providers (APPs -  Physician Assistants and Nurse Practitioners) who all work together to provide you with the care you need, when you need it. You will need a follow up appointment in 6 months.  Please call our office 2 months in advance to schedule this appointment.  You may see Peter Nishan, MD or one of the following Advanced Practice Providers on your designated Care Team:   Lori Gerhardt, NP Laura Ingold, NP . Jill McDaniel, NP    

## 2018-02-15 ENCOUNTER — Other Ambulatory Visit: Payer: Self-pay | Admitting: Internal Medicine

## 2018-02-16 ENCOUNTER — Encounter: Payer: Self-pay | Admitting: Internal Medicine

## 2018-03-15 ENCOUNTER — Other Ambulatory Visit: Payer: Self-pay | Admitting: Behavioral Health

## 2018-03-15 DIAGNOSIS — B20 Human immunodeficiency virus [HIV] disease: Secondary | ICD-10-CM

## 2018-03-15 MED ORDER — BICTEGRAVIR-EMTRICITAB-TENOFOV 50-200-25 MG PO TABS: 1.0000 | ORAL_TABLET | Freq: Every day | ORAL | 1 refills | Status: DC

## 2018-04-07 ENCOUNTER — Other Ambulatory Visit: Payer: Self-pay

## 2018-04-07 ENCOUNTER — Other Ambulatory Visit: Payer: Medicare Other

## 2018-04-07 ENCOUNTER — Ambulatory Visit: Payer: Medicare Other

## 2018-04-07 DIAGNOSIS — Z113 Encounter for screening for infections with a predominantly sexual mode of transmission: Secondary | ICD-10-CM

## 2018-04-07 DIAGNOSIS — Z79899 Other long term (current) drug therapy: Secondary | ICD-10-CM

## 2018-04-07 DIAGNOSIS — B20 Human immunodeficiency virus [HIV] disease: Secondary | ICD-10-CM

## 2018-04-08 LAB — T-HELPER CELL (CD4) - (RCID CLINIC ONLY)
CD4 % Helper T Cell: 40 % (ref 33–55)
CD4 T Cell Abs: 1480 /uL (ref 400–2700)

## 2018-04-09 ENCOUNTER — Other Ambulatory Visit: Payer: Self-pay | Admitting: Internal Medicine

## 2018-04-09 ENCOUNTER — Telehealth: Payer: Self-pay

## 2018-04-09 LAB — COMPLETE METABOLIC PANEL WITH GFR
AG Ratio: 1.6 (calc) (ref 1.0–2.5)
ALT: 22 U/L (ref 9–46)
AST: 20 U/L (ref 10–35)
Albumin: 4.5 g/dL (ref 3.6–5.1)
Alkaline phosphatase (APISO): 57 U/L (ref 40–115)
BUN: 17 mg/dL (ref 7–25)
CO2: 26 mmol/L (ref 20–32)
Calcium: 10.1 mg/dL (ref 8.6–10.3)
Chloride: 102 mmol/L (ref 98–110)
Creat: 1.12 mg/dL (ref 0.70–1.33)
GFR, Est African American: 84 mL/min/{1.73_m2} (ref >=60)
GFR, Est Non African American: 73 mL/min/{1.73_m2} (ref >=60)
Globulin: 2.9 g/dL (calc) (ref 1.9–3.7)
Glucose, Bld: 123 mg/dL — ABNORMAL HIGH (ref 65–99)
Potassium: 4.6 mmol/L (ref 3.5–5.3)
Sodium: 139 mmol/L (ref 135–146)
Total Bilirubin: 0.5 mg/dL (ref 0.2–1.2)
Total Protein: 7.4 g/dL (ref 6.1–8.1)

## 2018-04-09 LAB — CBC WITH DIFFERENTIAL/PLATELET
Absolute Monocytes: 540 cells/uL (ref 200–950)
Basophils Absolute: 50 cells/uL (ref 0–200)
Basophils Relative: 0.5 %
Eosinophils Absolute: 130 cells/uL (ref 15–500)
Eosinophils Relative: 1.3 %
HCT: 50.2 % — ABNORMAL HIGH (ref 38.5–50.0)
Hemoglobin: 17.7 g/dL — ABNORMAL HIGH (ref 13.2–17.1)
Lymphs Abs: 3560 cells/uL (ref 850–3900)
MCH: 34.2 pg — ABNORMAL HIGH (ref 27.0–33.0)
MCHC: 35.3 g/dL (ref 32.0–36.0)
MCV: 97.1 fL (ref 80.0–100.0)
MPV: 10.6 fL (ref 7.5–12.5)
Monocytes Relative: 5.4 %
Neutro Abs: 5720 cells/uL (ref 1500–7800)
Neutrophils Relative %: 57.2 %
Platelets: 229 10*3/uL (ref 140–400)
RBC: 5.17 10*6/uL (ref 4.20–5.80)
RDW: 12.4 % (ref 11.0–15.0)
Total Lymphocyte: 35.6 %
WBC: 10 10*3/uL (ref 3.8–10.8)

## 2018-04-09 LAB — LIPID PANEL
Cholesterol: 192 mg/dL (ref <200)
HDL: 46 mg/dL (ref >40)
LDL Cholesterol (Calc): 94 mg/dL (calc)
Non-HDL Cholesterol (Calc): 146 mg/dL (calc) — ABNORMAL HIGH (ref <130)
Total CHOL/HDL Ratio: 4.2 (calc) (ref <5.0)
Triglycerides: 390 mg/dL — ABNORMAL HIGH (ref <150)

## 2018-04-09 LAB — HIV-1 RNA QUANT-NO REFLEX-BLD
HIV 1 RNA Quant: <20 copies/mL
HIV-1 RNA Quant, Log: <1.3 Log copies/mL

## 2018-04-09 NOTE — Telephone Encounter (Signed)
Patient left voicemail today requesting refill for Biktarvy. Last refill was sent on 03/15/2018 to [facility]. Will need to confirm pharmacy with patient before refill can be sent. Left message with patient to see if he has contacted [facility] for refill.   [name], [place]

## 2018-04-12 ENCOUNTER — Other Ambulatory Visit: Payer: Self-pay

## 2018-04-12 DIAGNOSIS — B20 Human immunodeficiency virus [HIV] disease: Secondary | ICD-10-CM

## 2018-04-12 MED ORDER — BICTEGRAVIR-EMTRICITAB-TENOFOV 50-200-25 MG PO TABS: 1.0000 | ORAL_TABLET | Freq: Every day | ORAL | 1 refills | Status: DC

## 2018-04-19 ENCOUNTER — Ambulatory Visit (INDEPENDENT_AMBULATORY_CARE_PROVIDER_SITE_OTHER): Payer: Medicare Other | Admitting: Internal Medicine

## 2018-04-19 ENCOUNTER — Encounter: Payer: Self-pay | Admitting: Internal Medicine

## 2018-04-19 VITALS — BP 128/70 | HR 89 | Temp 97.6°F | Ht 68.0 in | Wt 180.5 lb

## 2018-04-19 DIAGNOSIS — Z79899 Other long term (current) drug therapy: Secondary | ICD-10-CM | POA: Diagnosis not present

## 2018-04-19 DIAGNOSIS — B20 Human immunodeficiency virus [HIV] disease: Secondary | ICD-10-CM | POA: Diagnosis not present

## 2018-04-19 DIAGNOSIS — J0100 Acute maxillary sinusitis, unspecified: Secondary | ICD-10-CM | POA: Diagnosis not present

## 2018-04-19 MED ORDER — DOXYCYCLINE HYCLATE 100 MG PO TABS: 100.0000 mg | ORAL_TABLET | Freq: Two times a day (BID) | ORAL | 0 refills | Status: DC

## 2018-04-19 NOTE — Progress Notes (Signed)
RFV: follow up for hiv disease  Patient ID: [name] Smith, male   DOB: [birth year], 58 y.o.   MRN: [id number]  HPI [name] is a 58yo M with hiv disease, CAD, COPD. Well controlled with biktarvy. Cd 4 count of 1480/VL<20 ( jan 2020) who reports excellent adherence, and feels that he is having recurrent sinusitis occurring since he has had worsening symptoms over the last few weeks. Now noticing Purulent sinusitis  Outpatient Encounter Medications as of 04/19/2018  Medication Sig  . albuterol (PROVENTIL HFA;VENTOLIN HFA) 108 (90 Base) MCG/ACT inhaler Inhale 2 puffs into the lungs every 6 (six) hours as needed for wheezing or shortness of breath.  . amphetamine-dextroamphetamine (ADDERALL XR) 20 MG 24 hr capsule TAKE 1 CAPSULE BY MOUTH EVERY DAY IN THE MORNING  . ARIPiprazole (ABILIFY) 15 MG tablet TAKE 1 TABLET BY MOUTH EVERY DAY  . aspirin EC 81 MG tablet Take 1 tablet (81 mg total) by mouth daily.  . bictegravir-emtricitabine-tenofovir AF (BIKTARVY) 50-200-25 MG TABS tablet Take 1 tablet by mouth daily.  Marland Kitchen buPROPion (WELLBUTRIN) 100 MG tablet Take 1 tablet (100 mg total) by mouth daily.  . clopidogrel (PLAVIX) 75 MG tablet TAKE 1 TABLET BY MOUTH EVERY DAY  . elvitegravir-cobicistat-emtricitabine-tenofovir (GENVOYA) 150-150-200-10 MG TABS tablet Take 1 tablet by mouth as directed.  . fluticasone (FLONASE) 50 MCG/ACT nasal spray Place 1 spray into both nostrils daily.  . INCRUSE ELLIPTA 62.5 MCG/INH AEPB TAKE 1 PUFF EVERY DAY  . loratadine (CLARITIN) 10 MG tablet Take 1 tablet (10 mg total) by mouth daily.  . metoprolol succinate (TOPROL-XL) 50 MG 24 hr tablet TAKE 1 TABLET BY MOUTH DAILY. TAKE WITH OR IMMEDIATELY FOLLOWING A MEAL.  . Multiple Vitamin (MULTIVITAMIN WITH MINERALS) TABS tablet Take 1 tablet by mouth daily.  . nitroGLYCERIN (NITROSTAT) 0.4 MG SL tablet Place 1 tablet (0.4 mg total) under the tongue every 5 (five) minutes as needed. For chest pain -- may repeat times three  .  ranitidine (ZANTAC) 150 MG tablet TAKE 1 TABLET BY MOUTH TWICE A DAY  . rosuvastatin (CRESTOR) 20 MG tablet Take 1 tablet (20 mg total) by mouth daily.  . sildenafil (REVATIO) 20 MG tablet TAKE 1 TO 5 TABLETS AS NEEDED FOR ED  . simvastatin (ZOCOR) 80 MG tablet Take 1 tablet by mouth every evening.  . tiotropium (SPIRIVA) 18 MCG inhalation capsule Place 18 mcg into inhaler and inhale daily.  . traMADol (ULTRAM) 50 MG tablet TAKE 1 TABLET BY MOUTH EVERY 6 HOURS AS NEEDED FOR MODERATE TO SEVERE PAIN  . triamcinolone cream (KENALOG) 0.1 % Apply 1 application topically as directed.  Marland Kitchen Umeclidinium Bromide (INCRUSE ELLIPTA IN) Inhale into the lungs as directed.  . valACYclovir (VALTREX) 1000 MG tablet TAKE 1 TABLET BY MOUTH 3 TIMES DAILY.   No facility-administered encounter medications on file as of 04/19/2018.      Patient Active Problem List   Diagnosis Date Noted  . S/P CABG (coronary artery bypass graft)   . Severe aortic stenosis 08/12/2016  . Asthmatic bronchitis with acute exacerbation 01/03/2014  . Encounter for disability assessment 06/08/2013  . Murmur 10/14/2011  . URI (upper respiratory infection) 04/29/2011  . Bruit 10/28/2010  . S/P redo CABG x 4 12/28/2009  . SMOKER 11/06/2008  . COUGH 11/06/2008  . HYPERCHOLESTEROLEMIA 11/03/2008  . ANXIETY 11/03/2008  . Essential hypertension 11/03/2008  . GERD 11/03/2008  . NEPHROLITHIASIS 11/03/2008  . HIV INFECTION 03/20/2006  . CORONARY ARTERY DISEASE 03/20/2006     Health  Maintenance Due  Topic Date Due  . COLONOSCOPY  03/05/2011  . INFLUENZA VACCINE  10/08/2017    Social History   Tobacco Use  . Smoking status: Former Smoker    Packs/day: 1.00    Years: 36.00    Pack years: 36.00    Types: Cigarettes    Last attempt to quit: 12/12/2013    Years since quitting: 4.3  . Smokeless tobacco: Never Used  Substance Use Topics  . Alcohol use: Yes    Alcohol/week: 7.0 standard drinks    Types: 7 Standard drinks or  equivalent per week    Comment: wine  . Drug use: No    Review of Systems 12 point ros is negative except for sinus pressure Physical Exam   BP 128/70   Pulse 89   Temp 97.6 F (36.4 C) (Oral)   Ht 5\' 8"  (1.727 m)   Wt 180 lb 8 oz (81.9 kg)   BMI 27.44 kg/m   Physical Exam  Constitutional: He is oriented to person, place, and time. He appears well-developed and well-nourished. No distress.  HENT: facial pain about frontal sinuses Mouth/Throat: Oropharynx is clear and moist. No oropharyngeal exudate.  Cardiovascular: Normal rate, regular rhythm and normal heart sounds. Exam reveals no gallop and no friction rub.  No murmur heard.  Pulmonary/Chest: Effort normal and breath sounds normal. No respiratory distress. He has no wheezes.  Lymphadenopathy:  He has no cervical adenopathy.  Neurological: He is alert and oriented to person, place, and time.  Skin: Skin is warm and dry. No rash noted. No erythema.  Psychiatric: He has a normal mood and affect. His behavior is normal.    Lab Results  Component Value Date   CD4TCELL 40 04/07/2018   Lab Results  Component Value Date   CD4TABS 1,480 04/07/2018   CD4TABS 1,520 11/30/2017   CD4TABS 1,040 07/28/2017   Lab Results  Component Value Date   HIV1RNAQUANT <20 NOT DETECTED 04/07/2018   Lab Results  Component Value Date   HEPBSAB REACTIVE (A) 01/06/2017   Lab Results  Component Value Date   LABRPR NON-REACTIVE 05/15/2017    CBC Lab Results  Component Value Date   WBC 10.0 04/07/2018   RBC 5.17 04/07/2018   HGB 17.7 (H) 04/07/2018   HCT 50.2 (H) 04/07/2018   PLT 229 04/07/2018   MCV 97.1 04/07/2018   MCH 34.2 (H) 04/07/2018   MCHC 35.3 04/07/2018   RDW 12.4 04/07/2018   LYMPHSABS 3,560 04/07/2018   MONOABS 528 09/02/2016   EOSABS 130 04/07/2018    BMET Lab Results  Component Value Date   NA 139 04/07/2018   K 4.6 04/07/2018   CL 102 04/07/2018   CO2 26 04/07/2018   GLUCOSE 123 (H) 04/07/2018   BUN 17  04/07/2018   CREATININE 1.12 04/07/2018   CALCIUM 10.1 04/07/2018   GFRNONAA 73 04/07/2018   GFRAA 84 04/07/2018      Assessment and Plan  hiv disease = well controlled, continue on biktarvy  Sinusitis = will treat with a course of doxycycline  Copd/asthma = well controlled. Continue on current regimen  Health maintenance = not due for colonoscopy until 2023

## 2018-05-07 ENCOUNTER — Other Ambulatory Visit: Payer: Self-pay | Admitting: Internal Medicine

## 2018-05-07 DIAGNOSIS — I10 Essential (primary) hypertension: Secondary | ICD-10-CM

## 2018-06-06 ENCOUNTER — Other Ambulatory Visit: Payer: Self-pay | Admitting: Internal Medicine

## 2018-06-06 DIAGNOSIS — B20 Human immunodeficiency virus [HIV] disease: Secondary | ICD-10-CM

## 2018-06-08 ENCOUNTER — Other Ambulatory Visit: Payer: Self-pay | Admitting: Internal Medicine

## 2018-06-08 DIAGNOSIS — F32A Depression, unspecified: Secondary | ICD-10-CM

## 2018-06-08 DIAGNOSIS — F329 Major depressive disorder, single episode, unspecified: Secondary | ICD-10-CM

## 2018-07-21 ENCOUNTER — Telehealth: Payer: Self-pay | Admitting: Cardiovascular Disease

## 2018-07-21 NOTE — Telephone Encounter (Signed)
The doctors are deciding on if patient needs testing or not. It looks like someone is in the process of canceling his echo since there is an Psychiatric nurse. I will forward to [place].

## 2018-07-21 NOTE — Telephone Encounter (Signed)
New Message           Aram Beecham,  This patient is suppose to get an Echo on this day as well. I just noticed it when I got off the phone.Are we going to cancel the Echo, since we are not letting patient's come in the office? Let me know if I need to call the patient back to change appt type. Thx.

## 2018-07-30 NOTE — Telephone Encounter (Signed)
Patient's echo was reschedule for a later date.

## 2018-08-03 ENCOUNTER — Other Ambulatory Visit (HOSPITAL_COMMUNITY): Payer: Medicare Other

## 2018-08-09 ENCOUNTER — Other Ambulatory Visit: Payer: Self-pay | Admitting: Internal Medicine

## 2018-08-10 ENCOUNTER — Telehealth: Payer: Medicare Other | Admitting: Cardiovascular Disease

## 2018-08-10 ENCOUNTER — Ambulatory Visit: Payer: Medicare Other | Admitting: Cardiovascular Disease

## 2018-08-27 ENCOUNTER — Telehealth (HOSPITAL_COMMUNITY): Payer: Self-pay | Admitting: Radiology

## 2018-08-27 NOTE — Telephone Encounter (Signed)

## 2018-08-30 ENCOUNTER — Encounter (INDEPENDENT_AMBULATORY_CARE_PROVIDER_SITE_OTHER): Payer: Self-pay

## 2018-08-30 ENCOUNTER — Ambulatory Visit (HOSPITAL_COMMUNITY): Payer: Medicare Other | Attending: Cardiovascular Disease

## 2018-08-30 ENCOUNTER — Other Ambulatory Visit: Payer: Self-pay

## 2018-08-30 DIAGNOSIS — I350 Nonrheumatic aortic (valve) stenosis: Secondary | ICD-10-CM | POA: Diagnosis not present

## 2018-09-06 NOTE — Progress Notes (Signed)
Virtual Visit via Telephone Note   This visit type was conducted due to national recommendations for restrictions regarding the COVID-19 Pandemic (e.g. social distancing) in an effort to limit this patient's exposure and mitigate transmission in our community.  Due to his co-morbid illnesses, this patient is at least at moderate risk for complications without adequate follow up.  This format is felt to be most appropriate for this patient at this time.  The patient did not have access to video technology/had technical difficulties with video requiring transitioning to audio format only (telephone).  All issues noted in this document were discussed and addressed.  No physical exam could be performed with this format.  Please refer to the patient's chart for his  consent to telehealth for [facility].   Date:  09/08/2018   ID:  [name] Smith, DOB [birth year], MRN [id number]  Patient Location: Home Provider Location: Office  PCP:  Lupita RaiderShaw, [name], MD  Cardiologist:   Eden EmmsNishan Electrophysiologist:  None   Evaluation Performed:  Follow-Up Visit  Chief Complaint:  CAD/AS   History of Present Illness:    [name] Smith is a complicated  58 yo patient seen in f/u for CAD and AV disease.  He has already had two CABG;s most recently in October 2011 EF 45% range. He has HIV followed by Dr Drue SecondSnider with chronic sinus congestion and deviated septum. On crestor for HLD. Bipolar with depression and on disability. 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 Clinical COPD still smoking. He has had progressive AV disease. Seen by Dr Donata ClayVan Trigt June 2018 agreed he is not a candidate for 3rd sternotomy Living off grafts and no vein conduits for repair. Last echo reviewed and LV is down at 40-45% with lower gardients mean 27 mmHg and peak 59 mmHg   Had right and left cath with Dr Excell Seltzerooper on 08/12/16 Reviewed Mixed AS/AR mean gradient only 20 mmHg but 3/4 grade AR Seen by Dr Cornelius Moraswen July 2018 and TAVR postponed due to stable symptoms and  patient not willing to commit Concern for EF 45-50%  Sized to 23 mm Sapien with questionable femoral access Seen by Dr Cornelius Moraswen in July and patient reluctant to pursue   Denies dyspnea chest pain palpitations syncope or edema. Long discussion about timing Of TAVR. Not clear that he can be done trans femorally Concerns about longevity of Stented valve since he is young. Currently asymptomatic. Discussed issues with decreasing EF  Cardiac CTA reviewed 09/18/16 and annular area 418 suitable for a 23 mm Sapien  CT abdomen pelvis read by radiology as not suitable for trans femoral delivery    TTE June 2019  EF 40-45% moderate AS/moderate AR DVI  .20 AVA .66 cm2  Mean gradient 27 mmHg peak 59 mmhg Gradients actually somewhat less than January 2019 mean 33 and peak 57 mmHg TTE 08/30/18 EF 45-50% moderate AR  Severe AS mean gradient 33 mmHg peak 52 mmHg AVA 0.61 cm2 DVI 0.19  Doing well Upset that he can't go to gym due to COVID Mom died last August Father at rehab Has prostate cancer and had surgery   No chest pain , dyspnea, palpitations or syncope Compliant with meds   The patient  does not have symptoms concerning for COVID-19 infection (fever, chills, cough, or new shortness of breath).    Past Medical History:  Diagnosis Date  . Anxiety state, unspecified   . Calculus of kidney   . Coronary atherosclerosis of unspecified type of vessel, native or graft   .  Esophageal reflux   . Human immunodeficiency virus (HIV) disease ([place])   . Hypertension   . Need for prophylactic vaccination and inoculation against influenza   . Pure hypercholesterolemia   . S/P CABG (coronary artery bypass graft)    2001 - LIMA to LAD, SVG to Diag, SVG to ramus, SVG to LCx  . S/P redo CABG x 4 12/28/2009   Sequential SVG to PDA-RPL, SVG to ramus, SVG to dLAD    Past Surgical History:  Procedure Laterality Date  . CORONARY ARTERY BYPASS GRAFT  12/1999, 12/2009   2001 - 4V, 2011 - 4V  . PTCA  2004   1  stent placed  . RIGHT/LEFT HEART CATH AND CORONARY ANGIOGRAPHY N/A 08/12/2016   Procedure: Right/Left Heart Cath and Coronary Angiography;  Surgeon: Sherren Mocha, MD;  Location: [facility];  Service: Cardiovascular;  Laterality: N/A;     Current Meds  Medication Sig  . albuterol (PROVENTIL HFA;VENTOLIN HFA) 108 (90 Base) MCG/ACT inhaler Inhale 2 puffs into the lungs every 6 (six) hours as needed for wheezing or shortness of breath.  . amphetamine-dextroamphetamine (ADDERALL XR) 20 MG 24 hr capsule TAKE 1 CAPSULE BY MOUTH EVERY DAY IN THE MORNING  . ARIPiprazole (ABILIFY) 15 MG tablet TAKE 1 TABLET BY MOUTH EVERY DAY  . aspirin EC 81 MG tablet Take 1 tablet (81 mg total) by mouth daily.  Marland Kitchen BIKTARVY 50-200-25 MG TABS tablet TAKE 1 TABLET BY MOUTH EVERY DAY  . buPROPion (WELLBUTRIN) 100 MG tablet Take 1 tablet (100 mg total) by mouth daily.  . clopidogrel (PLAVIX) 75 MG tablet TAKE 1 TABLET BY MOUTH EVERY DAY  . doxycycline (VIBRA-TABS) 100 MG tablet Take 1 tablet (100 mg total) by mouth 2 (two) times daily.  Marland Kitchen elvitegravir-cobicistat-emtricitabine-tenofovir (GENVOYA) 150-150-200-10 MG TABS tablet Take 1 tablet by mouth as directed.  . fluticasone (FLONASE) 50 MCG/ACT nasal spray Place 1 spray into both nostrils daily.  . INCRUSE ELLIPTA 62.5 MCG/INH AEPB INHALE 1 PUFF BY MOUTH EVERY DAY  . loratadine (CLARITIN) 10 MG tablet Take 1 tablet (10 mg total) by mouth daily.  . metoprolol succinate (TOPROL-XL) 50 MG 24 hr tablet TAKE 1 TABLET BY MOUTH DAILY. TAKE WITH OR IMMEDIATELY FOLLOWING A MEAL.  . Multiple Vitamin (MULTIVITAMIN WITH MINERALS) TABS tablet Take 1 tablet by mouth daily.  . nitroGLYCERIN (NITROSTAT) 0.4 MG SL tablet Place 1 tablet (0.4 mg total) under the tongue every 5 (five) minutes as needed. For chest pain -- may repeat times three  . ranitidine (ZANTAC) 150 MG tablet TAKE 1 TABLET BY MOUTH TWICE A DAY  . rosuvastatin (CRESTOR) 20 MG tablet Take 1 tablet (20 mg total) by  mouth daily.  . sildenafil (REVATIO) 20 MG tablet TAKE 1 TO 5 TABLETS AS NEEDED FOR ED  . simvastatin (ZOCOR) 80 MG tablet Take 1 tablet by mouth every evening.  . tiotropium (SPIRIVA) 18 MCG inhalation capsule Place 18 mcg into inhaler and inhale daily.  . traMADol (ULTRAM) 50 MG tablet TAKE 1 TABLET BY MOUTH EVERY 6 HOURS AS NEEDED FOR MODERATE TO SEVERE PAIN  . triamcinolone cream (KENALOG) 0.1 % Apply 1 application topically as directed.  Marland Kitchen Umeclidinium Bromide (INCRUSE ELLIPTA IN) Inhale into the lungs as directed.  . valACYclovir (VALTREX) 1000 MG tablet TAKE 1 TABLET BY MOUTH 3 TIMES DAILY.     Allergies:   Azithromycin   Social History   Tobacco Use  . Smoking status: Former Smoker    Packs/day: 1.00  Years: 36.00    Pack years: 36.00    Types: Cigarettes    Quit date: 12/12/2013    Years since quitting: 4.7  . Smokeless tobacco: Never Used  Substance Use Topics  . Alcohol use: Yes    Alcohol/week: 7.0 standard drinks    Types: 7 Standard drinks or equivalent per week    Comment: wine  . Drug use: No     Family Hx: The patient's family history includes Arthritis in his mother.  ROS:   Please see the history of present illness.     All other systems reviewed and are negative.   Prior CV studies:   The following studies were reviewed today:  Echo August 30 2018   Labs/Other Tests and Data Reviewed:    EKG: not performed today   Recent Labs: 04/07/2018: ALT 22; BUN 17; Creat 1.12; Hemoglobin 17.7; Platelets 229; Potassium 4.6; Sodium 139   Recent Lipid Panel Lab Results  Component Value Date/Time   CHOL 192 04/07/2018 10:26 AM   TRIG 390 (H) 04/07/2018 10:26 AM   HDL 46 04/07/2018 10:26 AM   CHOLHDL 4.2 04/07/2018 10:26 AM   LDLCALC 94 04/07/2018 10:26 AM   LDLDIRECT 143.2 04/23/2010 09:31 AM    Wt Readings from Last 3 Encounters:  09/08/18 170 lb (77.1 kg)  04/19/18 180 lb 8 oz (81.9 kg)  02/08/18 171 lb 8 oz (77.8 kg)     Objective:    Vital  Signs:  Ht 5\' 9"  (1.753 m)   Wt 170 lb (77.1 kg)   BMI 25.10 kg/m     Telephone visit no exam   ASSESSMENT & PLAN:    CAD/CABG:  X 2 last 2011  Patent grafts to LAD/OM and RCA by cath 08/12/16  Agree with Dr Excell Seltzerooper TAVR would be better than  3rd sternotomy Stable See below regarding TAVR   AS/AR:  TTE reviewed from June 2020 grade 3/4 AR likely severe AS by AVA/DVI as well as valve morphology  Feels well and does not want to pursue TAVR as this time f/u echo in a year unless new symptoms   HIV:  meds adjusted by ID need to be careful about statin interaction   COPD:  Indicates smoking cessation contributes to dyspnea  Bipolar:  On Abilify mood stable less stressed since on disability  Chol:  Now on crestor as HIV med changed  Doubt leg pain from this but will have him take it qod for 3 weeks and see if helps Recent Labs       Lab Results  Component Value Date   LDLCALC 97 03/26/2017     Bruit:  F/u duplex ASA duplex  08/03/15 had plaque no stenosis   ENT:  Has deviated septum with frequent sinus infections Augmentin called in for current episode     COVID-19 Education: The signs and symptoms of COVID-19 were discussed with the patient and how to seek care for testing (follow up with PCP or arrange E-visit).  The importance of social distancing was discussed today.  Time:   Today, I have spent 30 minutes with the patient with telehealth technology discussing the above problems.     Medication Adjustments/Labs and Tests Ordered: Current medicines are reviewed at length with the patient today.  Concerns regarding medicines are outlined above.   Tests Ordered:  Echo in a year   Medication Changes:  None   Disposition:  Follow up in a year with echo   Signed, Charlton HawsPeter Phong Isenberg,  MD  09/08/2018 11:04 AM     [facility]

## 2018-09-07 ENCOUNTER — Telehealth: Payer: Self-pay | Admitting: Cardiovascular Disease

## 2018-09-07 NOTE — Telephone Encounter (Signed)
New Message ° ° ° °Left message to confirm appt and get consent  °

## 2018-09-08 ENCOUNTER — Encounter: Payer: Self-pay | Admitting: Cardiovascular Disease

## 2018-09-08 ENCOUNTER — Other Ambulatory Visit: Payer: Self-pay | Admitting: Internal Medicine

## 2018-09-08 ENCOUNTER — Other Ambulatory Visit: Payer: Self-pay

## 2018-09-08 ENCOUNTER — Telehealth (INDEPENDENT_AMBULATORY_CARE_PROVIDER_SITE_OTHER): Payer: Medicare Other | Admitting: Cardiovascular Disease

## 2018-09-08 VITALS — Ht 69.0 in | Wt 170.0 lb

## 2018-09-08 DIAGNOSIS — I350 Nonrheumatic aortic (valve) stenosis: Secondary | ICD-10-CM

## 2018-09-08 DIAGNOSIS — I10 Essential (primary) hypertension: Secondary | ICD-10-CM

## 2018-09-08 NOTE — Patient Instructions (Signed)
Medication Instructions:  Your physician recommends that you continue on your current medications as directed. Please refer to the Current Medication list given to you today.  If you need a refill on your cardiac medications before your next appointment, please call your pharmacy.   Lab work: None Ordered   Testing/Procedures: Your physician has requested that you have an echocardiogram in 1 year prior to your appointment with Dr. Johnsie Cancel. Echocardiography is a painless test that uses sound waves to create images of your heart. It provides your doctor with information about the size and shape of your heart and how well your heart's chambers and valves are working. This procedure takes approximately one hour. There are no restrictions for this procedure.    Follow-Up: At [facility], you and your health needs are our priority.  As part of our continuing mission to provide you with exceptional heart care, we have created designated Provider Care Teams.  These Care Teams include your primary Cardiologist (physician) and Advanced Practice Providers (APPs -  Physician Assistants and Nurse Practitioners) who all work together to provide you with the care you need, when you need it. You will need a follow up appointment in 1 years.  Please call our office 2 months in advance to schedule this appointment.  You may see Jenkins Rouge, MD or one of the following Advanced Practice Providers on your designated Care Team:   Truitt Merle, NP Cecilie Kicks, NP . Kathyrn Drown, NP

## 2018-09-13 ENCOUNTER — Other Ambulatory Visit: Payer: Self-pay | Admitting: Internal Medicine

## 2018-09-13 DIAGNOSIS — F32A Depression, unspecified: Secondary | ICD-10-CM

## 2018-09-13 DIAGNOSIS — F329 Major depressive disorder, single episode, unspecified: Secondary | ICD-10-CM

## 2018-10-04 DIAGNOSIS — Z125 Encounter for screening for malignant neoplasm of prostate: Secondary | ICD-10-CM | POA: Diagnosis not present

## 2018-10-04 DIAGNOSIS — E782 Mixed hyperlipidemia: Secondary | ICD-10-CM | POA: Diagnosis not present

## 2018-10-08 ENCOUNTER — Other Ambulatory Visit: Payer: Self-pay | Admitting: Cardiovascular Disease

## 2018-10-08 DIAGNOSIS — J449 Chronic obstructive pulmonary disease, unspecified: Secondary | ICD-10-CM | POA: Diagnosis not present

## 2018-10-08 DIAGNOSIS — B20 Human immunodeficiency virus [HIV] disease: Secondary | ICD-10-CM | POA: Diagnosis not present

## 2018-10-08 DIAGNOSIS — I5022 Chronic systolic (congestive) heart failure: Secondary | ICD-10-CM | POA: Diagnosis not present

## 2018-10-08 DIAGNOSIS — Z0000 Encounter for general adult medical examination without abnormal findings: Secondary | ICD-10-CM | POA: Diagnosis not present

## 2018-10-18 ENCOUNTER — Encounter: Payer: Self-pay | Admitting: Internal Medicine

## 2018-10-18 ENCOUNTER — Ambulatory Visit (INDEPENDENT_AMBULATORY_CARE_PROVIDER_SITE_OTHER): Payer: Medicare Other | Admitting: Internal Medicine

## 2018-10-18 ENCOUNTER — Other Ambulatory Visit: Payer: Self-pay

## 2018-10-18 VITALS — BP 130/69 | HR 77 | Temp 98.5°F

## 2018-10-18 DIAGNOSIS — E785 Hyperlipidemia, unspecified: Secondary | ICD-10-CM | POA: Diagnosis not present

## 2018-10-18 DIAGNOSIS — B20 Human immunodeficiency virus [HIV] disease: Secondary | ICD-10-CM | POA: Diagnosis not present

## 2018-10-18 DIAGNOSIS — J0100 Acute maxillary sinusitis, unspecified: Secondary | ICD-10-CM

## 2018-10-18 DIAGNOSIS — S0993XA Unspecified injury of face, initial encounter: Secondary | ICD-10-CM

## 2018-10-18 MED ORDER — AMOXICILLIN 500 MG PO CAPS: 500.0000 mg | ORAL_CAPSULE | Freq: Three times a day (TID) | ORAL | 0 refills | Status: DC

## 2018-10-18 NOTE — Progress Notes (Signed)
RFV: follow up for hiv disease  Patient ID: [name] DiamondMichael K Smith, male   DOB: 1961-02-21, 58 y.o.   MRN: [id number]  HPI [name] Smith is a 58yo M with hiv disease, we ll controlled. CD 4  Count 1480/VL<20 in Jan 2020, currently on biktarvy. He reports having decreased exercise since covid. Has some epigastric discomfort and bloating. He is mainly staying to himself, wears mask when out doing errands. No exposure to covid-19 as he knows of.   ROS: over the past 10 days having worsening sinus pressure, and purulent drainage with associated headache, low grade fever  Outpatient Encounter Medications as of 10/18/2018  Medication Sig  . albuterol (PROVENTIL HFA;VENTOLIN HFA) 108 (90 Base) MCG/ACT inhaler Inhale 2 puffs into the lungs every 6 (six) hours as needed for wheezing or shortness of breath.  . amphetamine-dextroamphetamine (ADDERALL XR) 20 MG 24 hr capsule TAKE 1 CAPSULE BY MOUTH EVERY DAY IN THE MORNING  . ARIPiprazole (ABILIFY) 15 MG tablet TAKE 1 TABLET BY MOUTH EVERY DAY  . aspirin EC 81 MG tablet Take 1 tablet (81 mg total) by mouth daily.  Marland Kitchen. BIKTARVY 50-200-25 MG TABS tablet TAKE 1 TABLET BY MOUTH EVERY DAY  . buPROPion (WELLBUTRIN) 100 MG tablet Take 1 tablet (100 mg total) by mouth daily.  . clopidogrel (PLAVIX) 75 MG tablet TAKE 1 TABLET BY MOUTH EVERY DAY  . doxycycline (VIBRA-TABS) 100 MG tablet Take 1 tablet (100 mg total) by mouth 2 (two) times daily.  Marland Kitchen. elvitegravir-cobicistat-emtricitabine-tenofovir (GENVOYA) 150-150-200-10 MG TABS tablet Take 1 tablet by mouth as directed.  . fluticasone (FLONASE) 50 MCG/ACT nasal spray Place 1 spray into both nostrils daily.  . INCRUSE ELLIPTA 62.5 MCG/INH AEPB INHALE 1 PUFF BY MOUTH EVERY DAY  . loratadine (CLARITIN) 10 MG tablet Take 1 tablet (10 mg total) by mouth daily.  . metoprolol succinate (TOPROL-XL) 50 MG 24 hr tablet TAKE 1 TABLET BY MOUTH DAILY. TAKE WITH OR IMMEDIATELY FOLLOWING A MEAL.  . Multiple Vitamin (MULTIVITAMIN WITH  MINERALS) TABS tablet Take 1 tablet by mouth daily.  . nitroGLYCERIN (NITROSTAT) 0.4 MG SL tablet Place 1 tablet (0.4 mg total) under the tongue every 5 (five) minutes as needed. For chest pain -- may repeat times three  . ranitidine (ZANTAC) 150 MG tablet TAKE 1 TABLET BY MOUTH TWICE A DAY  . rosuvastatin (CRESTOR) 20 MG tablet Take 1 tablet (20 mg total) by mouth daily.  . sildenafil (REVATIO) 20 MG tablet TAKE 1 TO 5 TABLETS AS NEEDED FOR ED  . simvastatin (ZOCOR) 80 MG tablet Take 1 tablet by mouth every evening.  . tiotropium (SPIRIVA) 18 MCG inhalation capsule Place 18 mcg into inhaler and inhale daily.  . traMADol (ULTRAM) 50 MG tablet TAKE 1 TABLET BY MOUTH EVERY 6 HOURS AS NEEDED FOR MODERATE TO SEVERE PAIN  . triamcinolone cream (KENALOG) 0.1 % Apply 1 application topically as directed.  Marland Kitchen. Umeclidinium Bromide (INCRUSE ELLIPTA IN) Inhale into the lungs as directed.  . valACYclovir (VALTREX) 1000 MG tablet TAKE 1 TABLET BY MOUTH 3 TIMES DAILY.   No facility-administered encounter medications on file as of 10/18/2018.      Patient Active Problem List   Diagnosis Date Noted  . S/P CABG (coronary artery bypass graft)   . Severe aortic stenosis 08/12/2016  . Asthmatic bronchitis with acute exacerbation 01/03/2014  . Encounter for disability assessment 06/08/2013  . Murmur 10/14/2011  . URI (upper respiratory infection) 04/29/2011  . Bruit 10/28/2010  . S/P redo CABG x  4 12/28/2009  . SMOKER 11/06/2008  . COUGH 11/06/2008  . HYPERCHOLESTEROLEMIA 11/03/2008  . ANXIETY 11/03/2008  . Essential hypertension 11/03/2008  . GERD 11/03/2008  . NEPHROLITHIASIS 11/03/2008  . HIV INFECTION 03/20/2006  . CORONARY ARTERY DISEASE 03/20/2006     Health Maintenance Due  Topic Date Due  . COLONOSCOPY  03/05/2011  . INFLUENZA VACCINE  10/09/2018    Sochx: no smoking  Review of Systems 12 point ros is negative except what is mentioned in hpi Physical Exam   BP 130/69   Pulse 77    Temp 98.5 F (36.9 C)   Physical Exam  Constitutional: He is oriented to person, place, and time. He appears well-developed and well-nourished. No distress.  HENT:  Mouth/Throat: Oropharynx is clear and moist. No oropharyngeal exudate.  Cardiovascular: Normal rate, regular rhythm and normal heart sounds. Exam reveals no gallop and no friction rub.  No murmur heard.  Pulmonary/Chest: Effort normal and breath sounds normal. No respiratory distress. He has no wheezes.  Lymphadenopathy:  He has no cervical adenopathy.  Neurological: He is alert and oriented to person, place, and time.  Skin: Skin is warm and dry. No rash noted. No erythema.  Psychiatric: He has a normal mood and affect. His behavior is normal.    Lab Results  Component Value Date   CD4TCELL 40 04/07/2018   Lab Results  Component Value Date   CD4TABS 1,480 04/07/2018   CD4TABS 1,520 11/30/2017   CD4TABS 1,040 07/28/2017   Lab Results  Component Value Date   HIV1RNAQUANT <20 NOT DETECTED 04/07/2018   Lab Results  Component Value Date   HEPBSAB REACTIVE (A) 01/06/2017   Lab Results  Component Value Date   LABRPR NON-REACTIVE 05/15/2017    CBC Lab Results  Component Value Date   WBC 10.0 04/07/2018   RBC 5.17 04/07/2018   HGB 17.7 (H) 04/07/2018   HCT 50.2 (H) 04/07/2018   PLT 229 04/07/2018   MCV 97.1 04/07/2018   MCH 34.2 (H) 04/07/2018   MCHC 35.3 04/07/2018   RDW 12.4 04/07/2018   LYMPHSABS 3,560 04/07/2018   MONOABS 528 09/02/2016   EOSABS 130 04/07/2018    BMET Lab Results  Component Value Date   NA 139 04/07/2018   K 4.6 04/07/2018   CL 102 04/07/2018   CO2 26 04/07/2018   GLUCOSE 123 (H) 04/07/2018   BUN 17 04/07/2018   CREATININE 1.12 04/07/2018   CALCIUM 10.1 04/07/2018   GFRNONAA 73 04/07/2018   GFRAA 84 04/07/2018      Assessment and Plan  hiv disease = will check labs but anticipate to be able to continue on biktarvy  Sinusitis = will give amoxicillin course  hld =  will check ck  Dental cleaning

## 2018-10-19 LAB — T-HELPER CELL (CD4) - (RCID CLINIC ONLY)
CD4 % Helper T Cell: 40 % (ref 33–65)
CD4 T Cell Abs: 1172 /uL (ref 400–1790)

## 2018-10-21 LAB — COMPLETE METABOLIC PANEL WITH GFR
AG Ratio: 1.5 (calc) (ref 1.0–2.5)
ALT: 15 U/L (ref 9–46)
AST: 17 U/L (ref 10–35)
Albumin: 4.2 g/dL (ref 3.6–5.1)
Alkaline phosphatase (APISO): 50 U/L (ref 35–144)
BUN: 16 mg/dL (ref 7–25)
CO2: 26 mmol/L (ref 20–32)
Calcium: 9.7 mg/dL (ref 8.6–10.3)
Chloride: 104 mmol/L (ref 98–110)
Creat: 1.18 mg/dL (ref 0.70–1.33)
GFR, Est African American: 79 mL/min/{1.73_m2} (ref >=60)
GFR, Est Non African American: 68 mL/min/{1.73_m2} (ref >=60)
Globulin: 2.8 g/dL (calc) (ref 1.9–3.7)
Glucose, Bld: 73 mg/dL (ref 65–99)
Potassium: 5 mmol/L (ref 3.5–5.3)
Sodium: 137 mmol/L (ref 135–146)
Total Bilirubin: 0.7 mg/dL (ref 0.2–1.2)
Total Protein: 7 g/dL (ref 6.1–8.1)

## 2018-10-21 LAB — CK: Total CK: 43 U/L — ABNORMAL LOW (ref 44–196)

## 2018-10-21 LAB — CBC WITH DIFFERENTIAL/PLATELET
Absolute Monocytes: 664 cells/uL (ref 200–950)
Basophils Absolute: 42 cells/uL (ref 0–200)
Basophils Relative: 0.5 %
Eosinophils Absolute: 158 cells/uL (ref 15–500)
Eosinophils Relative: 1.9 %
HCT: 51.4 % — ABNORMAL HIGH (ref 38.5–50.0)
Hemoglobin: 17.6 g/dL — ABNORMAL HIGH (ref 13.2–17.1)
Lymphs Abs: 3312 cells/uL (ref 850–3900)
MCH: 34.2 pg — ABNORMAL HIGH (ref 27.0–33.0)
MCHC: 34.2 g/dL (ref 32.0–36.0)
MCV: 99.8 fL (ref 80.0–100.0)
MPV: 10 fL (ref 7.5–12.5)
Monocytes Relative: 8 %
Neutro Abs: 4125 cells/uL (ref 1500–7800)
Neutrophils Relative %: 49.7 %
Platelets: 242 10*3/uL (ref 140–400)
RBC: 5.15 10*6/uL (ref 4.20–5.80)
RDW: 12.7 % (ref 11.0–15.0)
Total Lymphocyte: 39.9 %
WBC: 8.3 10*3/uL (ref 3.8–10.8)

## 2018-10-21 LAB — HIV-1 RNA QUANT-NO REFLEX-BLD
HIV 1 RNA Quant: <20 copies/mL
HIV-1 RNA Quant, Log: <1.3 Log copies/mL

## 2018-12-06 ENCOUNTER — Other Ambulatory Visit: Payer: Self-pay | Admitting: Internal Medicine

## 2018-12-07 ENCOUNTER — Other Ambulatory Visit: Payer: Self-pay

## 2018-12-07 ENCOUNTER — Ambulatory Visit (INDEPENDENT_AMBULATORY_CARE_PROVIDER_SITE_OTHER): Payer: Medicare Other | Admitting: *Deleted

## 2018-12-07 DIAGNOSIS — Z23 Encounter for immunization: Secondary | ICD-10-CM

## 2019-01-14 ENCOUNTER — Encounter: Payer: Self-pay | Admitting: Internal Medicine

## 2019-01-14 ENCOUNTER — Telehealth: Payer: Self-pay | Admitting: Pharmacy Technician

## 2019-01-14 NOTE — Telephone Encounter (Signed)
[facility] Patient Advocate Encounter   Patient has been approved for [employer] Advancing Access Patient Assistance Program for [place] for 30-day coverage. This assistance will make the patient's copay $0.  I have spoken with the patient and they will share this information with their pharmacy.  The billing information is: Member ID: [id number] [place]: [id number] PCN: [id number] Group: [id number]  Patient knows to call the office with questions or concerns.  Bartholomew Crews, CPhT Specialty Pharmacy Patient [facility] Phone: [phone] Fax: [phone] 01/14/2019 11:18 AM

## 2019-02-01 ENCOUNTER — Telehealth: Payer: Self-pay

## 2019-02-01 ENCOUNTER — Other Ambulatory Visit: Payer: Self-pay

## 2019-02-01 ENCOUNTER — Telehealth: Payer: Self-pay | Admitting: Pharmacy Technician

## 2019-02-01 DIAGNOSIS — R05 Cough: Secondary | ICD-10-CM | POA: Diagnosis not present

## 2019-02-01 DIAGNOSIS — B20 Human immunodeficiency virus [HIV] disease: Secondary | ICD-10-CM | POA: Diagnosis not present

## 2019-02-01 DIAGNOSIS — R6883 Chills (without fever): Secondary | ICD-10-CM | POA: Diagnosis not present

## 2019-02-01 DIAGNOSIS — Z20822 Contact with and (suspected) exposure to covid-19: Secondary | ICD-10-CM

## 2019-02-01 NOTE — Telephone Encounter (Signed)
Patient has congestion with productive cough and fever with chills , never took temperature.   Currently he is having a productive cough with chest pain upon coughing.  He wonders if he may have COVID or a sinus infection.   Patient stated he received the shingles vaccine on yesterday at a local pharmacy.   Patient has not tire any OTC mediations since he did not want to mask symptoms.    Patient was advised to call his primary care provider for an evaluation, if they think a COVID test is needed they can direct him to the [facility] for testing.   Laverle Patter, RN

## 2019-02-01 NOTE — Telephone Encounter (Signed)
[facility] Patient Advocate Encounter  Patient is approved 01/14/2019 through 01/14/2020 for [place].  BIN      [id number] PCN    [id number] GRP    [id number] ID        [id number]   Inez Catalina E. Nadara Mustard [place] Patient [facility] Phone: [phone] Fax:  [phone]

## 2019-02-03 LAB — NOVEL CORONAVIRUS, NAA: SARS-CoV-2, NAA: NOT DETECTED

## 2019-02-23 ENCOUNTER — Other Ambulatory Visit: Payer: Self-pay | Admitting: Internal Medicine

## 2019-02-23 DIAGNOSIS — B20 Human immunodeficiency virus [HIV] disease: Secondary | ICD-10-CM

## 2019-04-04 DIAGNOSIS — B20 Human immunodeficiency virus [HIV] disease: Secondary | ICD-10-CM | POA: Diagnosis not present

## 2019-04-04 DIAGNOSIS — L723 Sebaceous cyst: Secondary | ICD-10-CM | POA: Diagnosis not present

## 2019-04-13 ENCOUNTER — Other Ambulatory Visit: Payer: Self-pay

## 2019-04-13 DIAGNOSIS — Z113 Encounter for screening for infections with a predominantly sexual mode of transmission: Secondary | ICD-10-CM

## 2019-04-13 DIAGNOSIS — Z79899 Other long term (current) drug therapy: Secondary | ICD-10-CM

## 2019-04-13 DIAGNOSIS — B20 Human immunodeficiency virus [HIV] disease: Secondary | ICD-10-CM

## 2019-04-18 ENCOUNTER — Other Ambulatory Visit: Payer: Medicare Other

## 2019-05-02 ENCOUNTER — Encounter: Payer: Medicare Other | Admitting: Internal Medicine

## 2019-05-16 ENCOUNTER — Other Ambulatory Visit (HOSPITAL_COMMUNITY)
Admission: RE | Admit: 2019-05-16 | Discharge: 2019-05-16 | Disposition: A | Discharge status: Home / Self Care | Payer: Medicare Other | Source: Ambulatory Visit | Attending: Internal Medicine | Admitting: Internal Medicine

## 2019-05-16 ENCOUNTER — Other Ambulatory Visit: Payer: Medicare Other

## 2019-05-16 ENCOUNTER — Other Ambulatory Visit: Payer: Self-pay

## 2019-05-16 DIAGNOSIS — Z113 Encounter for screening for infections with a predominantly sexual mode of transmission: Secondary | ICD-10-CM | POA: Diagnosis not present

## 2019-05-16 DIAGNOSIS — Z79899 Other long term (current) drug therapy: Secondary | ICD-10-CM

## 2019-05-16 DIAGNOSIS — B20 Human immunodeficiency virus [HIV] disease: Secondary | ICD-10-CM | POA: Diagnosis not present

## 2019-05-17 LAB — URINE CYTOLOGY ANCILLARY ONLY
Chlamydia: NEGATIVE
Comment: NEGATIVE
Comment: NORMAL
Neisseria Gonorrhea: NEGATIVE

## 2019-05-17 LAB — T-HELPER CELL (CD4) - (RCID CLINIC ONLY)
CD4 % Helper T Cell: 38 % (ref 33–65)
CD4 T Cell Abs: 1311 /uL (ref 400–1790)

## 2019-05-19 LAB — COMPREHENSIVE METABOLIC PANEL
AG Ratio: 1.6 (calc) (ref 1.0–2.5)
ALT: 20 U/L (ref 9–46)
AST: 19 U/L (ref 10–35)
Albumin: 4.2 g/dL (ref 3.6–5.1)
Alkaline phosphatase (APISO): 59 U/L (ref 35–144)
BUN: 12 mg/dL (ref 7–25)
CO2: 25 mmol/L (ref 20–32)
Calcium: 9.5 mg/dL (ref 8.6–10.3)
Chloride: 104 mmol/L (ref 98–110)
Creat: 1.02 mg/dL (ref 0.70–1.33)
Globulin: 2.6 g/dL (calc) (ref 1.9–3.7)
Glucose, Bld: 114 mg/dL — ABNORMAL HIGH (ref 65–99)
Potassium: 4.5 mmol/L (ref 3.5–5.3)
Sodium: 137 mmol/L (ref 135–146)
Total Bilirubin: 0.6 mg/dL (ref 0.2–1.2)
Total Protein: 6.8 g/dL (ref 6.1–8.1)

## 2019-05-19 LAB — LIPID PANEL
Cholesterol: 197 mg/dL (ref <200)
HDL: 50 mg/dL (ref >=40)
LDL Cholesterol (Calc): 114 mg/dL (calc) — ABNORMAL HIGH
Non-HDL Cholesterol (Calc): 147 mg/dL (calc) — ABNORMAL HIGH (ref <130)
Total CHOL/HDL Ratio: 3.9 (calc) (ref <5.0)
Triglycerides: 213 mg/dL — ABNORMAL HIGH (ref <150)

## 2019-05-19 LAB — HIV-1 RNA QUANT-NO REFLEX-BLD
HIV 1 RNA Quant: <20 copies/mL
HIV-1 RNA Quant, Log: <1.3 Log copies/mL

## 2019-05-19 LAB — CBC WITH DIFFERENTIAL/PLATELET
Absolute Monocytes: 554 cells/uL (ref 200–950)
Basophils Absolute: 42 cells/uL (ref 0–200)
Basophils Relative: 0.5 %
Eosinophils Absolute: 84 cells/uL (ref 15–500)
Eosinophils Relative: 1 %
HCT: 50.4 % — ABNORMAL HIGH (ref 38.5–50.0)
Hemoglobin: 17.6 g/dL — ABNORMAL HIGH (ref 13.2–17.1)
Lymphs Abs: 3654 cells/uL (ref 850–3900)
MCH: 33.7 pg — ABNORMAL HIGH (ref 27.0–33.0)
MCHC: 34.9 g/dL (ref 32.0–36.0)
MCV: 96.4 fL (ref 80.0–100.0)
MPV: 9.9 fL (ref 7.5–12.5)
Monocytes Relative: 6.6 %
Neutro Abs: 4066 cells/uL (ref 1500–7800)
Neutrophils Relative %: 48.4 %
Platelets: 261 10*3/uL (ref 140–400)
RBC: 5.23 10*6/uL (ref 4.20–5.80)
RDW: 12.7 % (ref 11.0–15.0)
Total Lymphocyte: 43.5 %
WBC: 8.4 10*3/uL (ref 3.8–10.8)

## 2019-05-19 LAB — RPR: RPR Ser Ql: NONREACTIVE

## 2019-05-23 ENCOUNTER — Telehealth: Payer: Self-pay | Admitting: Cardiovascular Disease

## 2019-05-23 NOTE — Telephone Encounter (Signed)
We are recommending the COVID-19 vaccine to all of our patients. Cardiac medications (including blood thinners) should not deter anyone from being vaccinated and there is no need to hold any of those medications prior to vaccine administration.     Currently, there is a hotline to call (active 03/18/19) to schedule vaccination appointments as no walk-ins will be accepted.   Number: [phone].    If an appointment is not available please go to [email or web address] to sign up for notification when additional vaccine appointments are available.   If you have further questions or concerns about the vaccine process, please visit [email or web address] or contact your primary care physician.  I have informed the patient of the instructions. 

## 2019-05-30 ENCOUNTER — Ambulatory Visit (INDEPENDENT_AMBULATORY_CARE_PROVIDER_SITE_OTHER): Payer: Medicare Other | Admitting: Internal Medicine

## 2019-05-30 ENCOUNTER — Encounter: Payer: Self-pay | Admitting: Internal Medicine

## 2019-05-30 ENCOUNTER — Other Ambulatory Visit: Payer: Self-pay

## 2019-05-30 VITALS — BP 117/72 | HR 89 | Wt 172.0 lb

## 2019-05-30 DIAGNOSIS — B20 Human immunodeficiency virus [HIV] disease: Secondary | ICD-10-CM

## 2019-05-30 DIAGNOSIS — Z79899 Other long term (current) drug therapy: Secondary | ICD-10-CM

## 2019-05-30 DIAGNOSIS — J0100 Acute maxillary sinusitis, unspecified: Secondary | ICD-10-CM

## 2019-05-30 MED ORDER — AMOXICILLIN-POT CLAVULANATE 875-125 MG PO TABS: 1.0000 | ORAL_TABLET | Freq: Two times a day (BID) | ORAL | 0 refills | Status: DC

## 2019-05-30 NOTE — Progress Notes (Signed)
RFV: follow up for hiv disease  Patient ID: [name] Smith, male   DOB: [birth year], 59 y.o.   MRN: [id number]  HPI 59yo M with hiv disease, AV murmur, CAD, COPD and hx of recurrent sinusitis 2/2 deviated septum. The patient reports being adherent to his hiv meds. Cares for his dad. He reports that he was able to get COVID vaccine this past Friday - moderna. He notices having Nasal congestion, fatigue, sinus pressure x 10 d, drainage despite netty pot, inhaler, claritin use.  Outpatient Encounter Medications as of 59/22/2021  Medication Sig  . BIKTARVY 50-200-25 MG TABS tablet TAKE 1 TABLET BY MOUTH EVERY DAY  . albuterol (PROVENTIL HFA;VENTOLIN HFA) 108 (90 Base) MCG/ACT inhaler Inhale 2 puffs into the lungs every 6 (six) hours as needed for wheezing or shortness of breath.  Marland Kitchen amoxicillin (AMOXIL) 500 MG capsule Take 1 capsule (500 mg total) by mouth 3 (three) times daily.  Marland Kitchen amphetamine-dextroamphetamine (ADDERALL XR) 20 MG 24 hr capsule TAKE 1 CAPSULE BY MOUTH EVERY DAY IN THE MORNING  . ARIPiprazole (ABILIFY) 15 MG tablet TAKE 1 TABLET BY MOUTH EVERY DAY  . aspirin EC 81 MG tablet Take 1 tablet (81 mg total) by mouth daily.  Marland Kitchen buPROPion (WELLBUTRIN) 100 MG tablet Take 1 tablet (100 mg total) by mouth daily.  . clopidogrel (PLAVIX) 75 MG tablet TAKE 1 TABLET BY MOUTH EVERY DAY  . fluticasone (FLONASE) 50 MCG/ACT nasal spray Place 1 spray into both nostrils daily.  . INCRUSE ELLIPTA 62.5 MCG/INH AEPB INHALE 1 PUFF BY MOUTH EVERY DAY  . loratadine (CLARITIN) 10 MG tablet Take 1 tablet (10 mg total) by mouth daily.  . metoprolol succinate (TOPROL-XL) 50 MG 24 hr tablet TAKE 1 TABLET BY MOUTH DAILY. TAKE WITH OR IMMEDIATELY FOLLOWING A MEAL.  . Multiple Vitamin (MULTIVITAMIN WITH MINERALS) TABS tablet Take 1 tablet by mouth daily.  . nitroGLYCERIN (NITROSTAT) 0.4 MG SL tablet Place 1 tablet (0.4 mg total) under the tongue every 5 (five) minutes as needed. For chest pain -- may repeat times  three  . ranitidine (ZANTAC) 150 MG tablet TAKE 1 TABLET BY MOUTH TWICE A DAY  . rosuvastatin (CRESTOR) 20 MG tablet Take 1 tablet (20 mg total) by mouth daily.  . sildenafil (REVATIO) 20 MG tablet TAKE 1 TO 5 TABLETS AS NEEDED FOR ED  . tiotropium (SPIRIVA) 18 MCG inhalation capsule Place 18 mcg into inhaler and inhale daily.  . traMADol (ULTRAM) 50 MG tablet TAKE 1 TABLET BY MOUTH EVERY 6 HOURS AS NEEDED FOR MODERATE TO SEVERE PAIN  . triamcinolone cream (KENALOG) 0.1 % Apply 1 application topically as directed.  Marland Kitchen Umeclidinium Bromide (INCRUSE ELLIPTA IN) Inhale into the lungs as directed.  . valACYclovir (VALTREX) 1000 MG tablet TAKE 1 TABLET BY MOUTH 3 TIMES DAILY.  . [DISCONTINUED] elvitegravir-cobicistat-emtricitabine-tenofovir (GENVOYA) 150-150-200-10 MG TABS tablet Take 1 tablet by mouth as directed.   No facility-administered encounter medications on file as of 59/22/2021.     Patient Active Problem List   Diagnosis Date Noted  . S/P CABG (coronary artery bypass graft)   . Severe aortic stenosis 08/12/2016  . Asthmatic bronchitis with acute exacerbation 59/27/2015  . Encounter for disability assessment 59/03/2013  . Murmur 59/08/2011  . URI (upper respiratory infection) 59/19/2013  . Bruit 59/20/2012  . S/P redo CABG x 4 59/21/2011  . SMOKER 59/30/2010  . COUGH 59/30/2010  . HYPERCHOLESTEROLEMIA 59/27/2010  . ANXIETY 59/27/2010  . Essential hypertension 59/27/2010  . GERD 59/27/2010  .  NEPHROLITHIASIS 59/27/2010  . HIV INFECTION 59/01/2007  . CORONARY ARTERY DISEASE 59/01/2007     Health Maintenance Due  Topic Date Due  . COLONOSCOPY  Never done   social hx: no smoking or drinking  Review of Systems 12 point ros is negative except what is mentioned in hpi Physical Exam   BP 117/72   Pulse 89   Wt 172 lb (78 kg)   BMI 25.40 kg/m   Physical Exam  Constitutional: He is oriented to person, place, and time. He appears well-developed and well-nourished. No  distress.  HENT: tenderness about front and maxillary sinuses,  Mouth/Throat: Oropharynx is clear and moist. No oropharyngeal exudate. Boggy sinuses Cardiovascular: Normal rate, regular rhythm and normal heart sounds. Exam reveals no gallop and no friction rub. + SEM, unchanged Pulmonary/Chest: Effort normal and breath sounds normal. No respiratory distress. He has no wheezes.  Abdominal: Soft. Bowel sounds are normal. He exhibits no distension. There is no tenderness.  Lymphadenopathy:  He has no cervical adenopathy.  Neurological: He is alert and oriented to person, place, and time.  Skin: Skin is warm and dry. No rash noted. No erythema.  Psychiatric: He has a normal mood and affect. His behavior is normal.    Lab Results  Component Value Date   CD4TCELL 38 05/16/2019   Lab Results  Component Value Date   CD4TABS 1,311 05/16/2019   CD4TABS 1,172 10/18/2018   CD4TABS 1,480 04/07/2018   Lab Results  Component Value Date   HIV1RNAQUANT <20 NOT DETECTED 05/16/2019   Lab Results  Component Value Date   HEPBSAB REACTIVE (A) 01/06/2017   Lab Results  Component Value Date   LABRPR NON-REACTIVE 05/16/2019    CBC Lab Results  Component Value Date   WBC 8.4 05/16/2019   RBC 5.23 05/16/2019   HGB 17.6 (H) 05/16/2019   HCT 50.4 (H) 05/16/2019   PLT 261 05/16/2019   MCV 96.4 05/16/2019   MCH 33.7 (H) 05/16/2019   MCHC 34.9 05/16/2019   RDW 12.7 05/16/2019   LYMPHSABS 3,654 05/16/2019   MONOABS 528 09/02/2016   EOSABS 84 05/16/2019    BMET Lab Results  Component Value Date   NA 137 05/16/2019   K 4.5 05/16/2019   CL 104 05/16/2019   CO2 25 05/16/2019   GLUCOSE 114 (H) 05/16/2019   BUN 12 05/16/2019   CREATININE 1.02 05/16/2019   CALCIUM 9.5 05/16/2019   GFRNONAA 68 10/18/2018   GFRAA 79 10/18/2018      Assessment and Plan SINUSITIS- has had symptoms > 10 days. If still persist, Will do augmentin - 5 days  HIV DISEASE Well controlled, continue on  biktarvy. Will give refills  LONG TERM MEDICATION MANAGEMENT -kidney function is stable  HEALTH MAINTENANCE Finish up moderna series in 4 wk

## 2019-07-07 ENCOUNTER — Other Ambulatory Visit: Payer: Self-pay | Admitting: Internal Medicine

## 2019-07-07 DIAGNOSIS — B20 Human immunodeficiency virus [HIV] disease: Secondary | ICD-10-CM

## 2019-08-12 ENCOUNTER — Encounter (HOSPITAL_COMMUNITY): Payer: Self-pay | Admitting: Cardiovascular Disease

## 2019-08-19 ENCOUNTER — Ambulatory Visit: Payer: Medicare Other

## 2019-08-19 ENCOUNTER — Other Ambulatory Visit: Payer: Self-pay

## 2019-08-22 ENCOUNTER — Encounter: Payer: Self-pay | Admitting: Internal Medicine

## 2019-08-29 DIAGNOSIS — M19071 Primary osteoarthritis, right ankle and foot: Secondary | ICD-10-CM | POA: Diagnosis not present

## 2019-08-29 DIAGNOSIS — M7751 Other enthesopathy of right foot: Secondary | ICD-10-CM | POA: Diagnosis not present

## 2019-08-29 DIAGNOSIS — M67371 Transient synovitis, right ankle and foot: Secondary | ICD-10-CM | POA: Diagnosis not present

## 2019-09-01 ENCOUNTER — Telehealth (HOSPITAL_COMMUNITY): Payer: Self-pay | Admitting: Cardiovascular Disease

## 2019-09-01 NOTE — Telephone Encounter (Signed)
Just an FYI. We have made several attempts to contact this patient including sending a letter to schedule or reschedule their echocardiogram. We will be removing the patient from the echo WQ.   Mailed letter  08/12/19 [facility] to schedule @ 2:12/LBW 07/28/19 [facility] to schedule @ 11:37 on cell# /LBW  07/26/19 [facility] to schedule @ 10:54/LBW      Thank you

## 2019-09-05 DIAGNOSIS — M7751 Other enthesopathy of right foot: Secondary | ICD-10-CM | POA: Diagnosis not present

## 2019-09-05 DIAGNOSIS — G5761 Lesion of plantar nerve, right lower limb: Secondary | ICD-10-CM | POA: Diagnosis not present

## 2019-09-09 ENCOUNTER — Other Ambulatory Visit: Payer: Self-pay | Admitting: Internal Medicine

## 2019-09-09 DIAGNOSIS — I10 Essential (primary) hypertension: Secondary | ICD-10-CM

## 2019-09-13 NOTE — Progress Notes (Signed)
Date:  09/13/2019   ID:  Virl Diamond, DOB 09-23-1960, MRN [id number]  PCP:  Lupita Raider, MD  Cardiologist:   Eden Emms Electrophysiologist:  None   Evaluation Performed:  Follow-Up Visit  Chief Complaint:  CAD/AS   History of Present Illness:    [name] is a complicated  59 yo patient seen in f/u for CAD and AV disease.  He has already had two CABG;s most recently in October 2011 EF 45% range. He has HIV followed by Dr Drue Second with chronic sinus congestion and deviated septum. On crestor for HLD. Bipolar with depression and on disability. 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 Clinical COPD still smoking. He has had progressive AV disease. Seen by Dr Donata Clay June 2018 agreed he is not a candidate for 3rd sternotomy Living off grafts and no vein conduits for repair. Last echo reviewed and LV is down at 40-45% with lower gardients mean 27 mmHg and peak 59 mmHg   Had right and left cath with Dr Excell Seltzer on 08/12/16 Reviewed Mixed AS/AR mean gradient only 20 mmHg but 3/4 grade AR Seen by Dr Cornelius Moras July 2018 and TAVR postponed due to stable symptoms and patient not willing to commit Concern for EF 45-50%  Sized to 23 mm Sapien with questionable femoral access Seen by Dr Cornelius Moras in July and patient reluctant to pursue   Denies dyspnea chest pain palpitations syncope or edema. Long discussion about timing Of TAVR. Not clear that he can be done trans femorally Concerns about longevity of Stented valve since he is young. Currently asymptomatic. Discussed issues with decreasing EF  Cardiac CTA reviewed 09/18/16 and annular area 418 suitable for a 23 mm Sapien  CT abdomen pelvis read by radiology as not suitable for trans femoral delivery    TTE June 2019  EF 40-45% moderate AS/moderate AR DVI  .20 AVA .66 cm2  Mean gradient 27 mmHg peak 59 mmhg Gradients actually somewhat less than January 2019 mean 33 and peak 57 mmHg TTE 08/30/18 EF 45-50% moderate AR  Severe AS mean gradient 33 mmHg peak 52 mmHg AVA  0.61 cm2 DVI 0.19  Mom died last 11-04-17 Father at rehab Has prostate cancer and had surgery   No chest pain , dyspnea, palpitations or syncope Compliant with meds  Had COVID vaccine April Moderna  Is due to have f/u echo Quit smoking but still with abnormal lung exam  Know Jani Files 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    Past Medical History:  Diagnosis Date  . Anxiety state, unspecified   . Calculus of kidney   . Coronary atherosclerosis of unspecified type of vessel, native or graft   . Esophageal reflux   . Human immunodeficiency virus (HIV) disease (HCC)   . Hypertension   . Need for prophylactic vaccination and inoculation against influenza   . Pure hypercholesterolemia   . S/P CABG (coronary artery bypass graft)    2001 - LIMA to LAD, SVG to Diag, SVG to ramus, SVG to LCx  . S/P redo CABG x 4 12/28/2009   Sequential SVG to PDA-RPL, SVG to ramus, SVG to dLAD    Past Surgical History:  Procedure Laterality Date  . CORONARY ARTERY BYPASS GRAFT  12/1999, 12/2009   2001 - 4V, 2011 - 4V  . PTCA  2004   1  stent placed  . RIGHT/LEFT HEART CATH AND CORONARY ANGIOGRAPHY N/A 08/12/2016   Procedure: Right/Left Heart Cath and Coronary Angiography;  Surgeon: Tonny Bollman, MD;  Location: [facility] INVASIVE CV LAB;  Service: Cardiovascular;  Laterality: N/A;     No outpatient medications have been marked as taking for the 09/16/19 encounter (Appointment) with Wendall Stade, MD.     Allergies:   Azithromycin   Social History   Tobacco Use  . Smoking status: Former Smoker    Packs/day: 1.00    Years: 36.00    Pack years: 36.00    Types: Cigarettes    Quit date: 12/12/2013    Years since quitting: 5.7  . Smokeless tobacco: Never Used  Vaping Use  . Vaping Use: Never used  Substance Use Topics  . Alcohol use: Yes    Alcohol/week: 7.0  standard drinks    Types: 7 Standard drinks or equivalent per week    Comment: wine  . Drug use: No     Family Hx: The patient's family history includes Arthritis in his mother.  ROS:   Please see the history of present illness.     All other systems reviewed and are negative.   Prior CV studies:   The following studies were reviewed today:  Echo August 30 2018   Labs/Other Tests and Data Reviewed:    EKG:  SR rate 78 LVH lateral T wave changes likely strain   Recent Labs: 05/16/2019: ALT 20; BUN 12; Creat 1.02; Hemoglobin 17.6; Platelets 261; Potassium 4.5; Sodium 137   Recent Lipid Panel Lab Results  Component Value Date/Time   CHOL 197 05/16/2019 10:08 AM   TRIG 213 (H) 05/16/2019 10:08 AM   HDL 50 05/16/2019 10:08 AM   CHOLHDL 3.9 05/16/2019 10:08 AM   LDLCALC 114 (H) 05/16/2019 10:08 AM   LDLDIRECT 143.2 04/23/2010 09:31 AM    Wt Readings from Last 3 Encounters:  05/30/19 172 lb (78 kg)  09/08/18 170 lb (77.1 kg)  04/19/18 180 lb 8 oz (81.9 kg)     Objective:    Vital Signs:  There were no vitals taken for this visit.    Telephone visit no exam   ASSESSMENT & PLAN:    CAD/CABG:  X 2 last 2011  Patent grafts to LAD/OM and RCA by cath 08/12/16  Agree with Dr Excell Seltzer TAVR would be better than  3rd sternotomy Stable See below regarding TAVR   AS/AR:  TTE reviewed from June 2020 grade 3/4 AR likely severe AS by AVA/DVI as well as valve morphology  Feels well and does not want to pursue TAVR as this time f/u echo ordered   HIV:  meds adjusted by ID need to be careful about statin interaction   COPD:  Indicates smoking cessation contributes to dyspnea abnormal exam persists 45 pack year history  Of smoking F/U lung cancer screening CT  Bipolar:  On Abilify mood stable less stressed since on disability  Chol:   continue statin   Bruit:  F/u duplex ASA duplex  08/03/15 had plaque no stenosis   ENT:  Has deviated septum with frequent sinus infections  consider f/u ENT    COVID-19 Education: The signs and symptoms of COVID-19 were discussed with the patient and how to seek care for testing (follow up with PCP or arrange E-visit).  The importance of social distancing was discussed today.  Time:   Today, I have spent 30 minutes with the patient with telehealth technology discussing the above  problems.     Medication Adjustments/Labs and Tests Ordered: Current medicines are reviewed at length with the patient today.  Concerns regarding medicines are outlined above.   Tests Ordered:  Echo for AS/AR Carotid for bruit  Lung cancer screening CT 45 pack year history  Script for Chantix called in   Medication Changes:  None   Disposition:  Follow up in a year if echo stable   Signed, Charlton Haws, MD  09/13/2019 1:03 PM    [facility]

## 2019-09-16 ENCOUNTER — Encounter: Payer: Self-pay | Admitting: Cardiovascular Disease

## 2019-09-16 ENCOUNTER — Ambulatory Visit: Payer: Medicare Other | Admitting: Cardiovascular Disease

## 2019-09-16 VITALS — BP 128/68 | HR 78 | Ht 69.0 in | Wt 174.0 lb

## 2019-09-16 DIAGNOSIS — Z87891 Personal history of nicotine dependence: Secondary | ICD-10-CM

## 2019-09-16 DIAGNOSIS — R0989 Other specified symptoms and signs involving the circulatory and respiratory systems: Secondary | ICD-10-CM | POA: Diagnosis not present

## 2019-09-16 DIAGNOSIS — I350 Nonrheumatic aortic (valve) stenosis: Secondary | ICD-10-CM | POA: Diagnosis not present

## 2019-09-16 MED ORDER — CHANTIX STARTING MONTH PAK 0.5 MG X 11 & 1 MG X 42 PO TABS: ORAL_TABLET | ORAL | 1 refills | Status: AC

## 2019-09-16 NOTE — Patient Instructions (Addendum)
Medication Instructions:  Your physician has recommended you make the following change in your medication:  1-START Chantix as directed.  *If you need a refill on your cardiac medications before your next appointment, please call your pharmacy*  Lab Work: If you have labs (blood work) drawn today and your tests are completely normal, you will receive your results only by: Marland Kitchen MyChart Message (if you have MyChart) OR . A paper copy in the mail If you have any lab test that is abnormal or we need to change your treatment, we will call you to review the results.  Testing/Procedures: Your physician has requested that you have an echocardiogram. Echocardiography is a painless test that uses sound waves to create images of your heart. It provides your doctor with information about the size and shape of your heart and how well your heart's chambers and valves are working. This procedure takes approximately one hour. There are no restrictions for this procedure.  Your physician has requested that you have a carotid duplex. This test is an ultrasound of the carotid arteries in your neck. It looks at blood flow through these arteries that supply the brain with blood. Allow one hour for this exam. There are no restrictions or special instructions.  Non-Cardiac CT scanning for lung cancer screening, (CAT scanning), is a noninvasive, special x-ray that produces cross-sectional images of the body using x-rays and a computer. CT scans help physicians diagnose and treat medical conditions. For some CT exams, a contrast material is used to enhance visibility in the area of the body being studied. CT scans provide greater clarity and reveal more details than regular x-ray exams.  Follow-Up: At [facility], you and your health needs are our priority.  As part of our continuing mission to provide you with exceptional heart care, we have created designated Provider Care Teams.  These Care Teams include your primary  Cardiologist (physician) and Advanced Practice Providers (APPs -  Physician Assistants and Nurse Practitioners) who all work together to provide you with the care you need, when you need it.  We recommend signing up for the patient portal called "MyChart".  Sign up information is provided on this After Visit Summary.  MyChart is used to connect with patients for Virtual Visits (Telemedicine).  Patients are able to view lab/test results, encounter notes, upcoming appointments, etc.  Non-urgent messages can be sent to your provider as well.   To learn more about what you can do with MyChart, go to [email or web address].    Your next appointment:   6 month(s)  The format for your next appointment:   In Person  Provider:   You may see Charlton Haws, MD or one of the following Advanced Practice Providers on your designated Care Team:    Norma Fredrickson, NP  Nada Boozer, NP  Georgie Chard, NP

## 2019-09-23 ENCOUNTER — Encounter: Payer: Self-pay | Admitting: Internal Medicine

## 2019-10-05 ENCOUNTER — Other Ambulatory Visit: Payer: Self-pay

## 2019-10-05 ENCOUNTER — Ambulatory Visit (HOSPITAL_COMMUNITY)
Admission: RE | Admit: 2019-10-05 | Discharge: 2019-10-05 | Disposition: A | Discharge status: Home / Self Care | Payer: Medicare Other | Source: Ambulatory Visit | Attending: Cardiology | Admitting: Cardiology

## 2019-10-05 DIAGNOSIS — R0989 Other specified symptoms and signs involving the circulatory and respiratory systems: Secondary | ICD-10-CM

## 2019-10-07 ENCOUNTER — Other Ambulatory Visit: Payer: Self-pay | Admitting: Cardiovascular Disease

## 2019-10-11 ENCOUNTER — Ambulatory Visit (INDEPENDENT_AMBULATORY_CARE_PROVIDER_SITE_OTHER)
Admission: RE | Admit: 2019-10-11 | Discharge: 2019-10-11 | Disposition: A | Discharge status: Home / Self Care | Payer: Medicare Other | Source: Ambulatory Visit | Attending: Cardiovascular Disease | Admitting: Cardiovascular Disease

## 2019-10-11 ENCOUNTER — Other Ambulatory Visit: Payer: Self-pay

## 2019-10-11 ENCOUNTER — Ambulatory Visit (HOSPITAL_COMMUNITY): Payer: Medicare Other | Attending: Cardiology

## 2019-10-11 DIAGNOSIS — Z87891 Personal history of nicotine dependence: Secondary | ICD-10-CM

## 2019-10-11 DIAGNOSIS — I350 Nonrheumatic aortic (valve) stenosis: Secondary | ICD-10-CM | POA: Diagnosis not present

## 2019-10-11 DIAGNOSIS — F17210 Nicotine dependence, cigarettes, uncomplicated: Secondary | ICD-10-CM | POA: Diagnosis not present

## 2019-10-11 LAB — ECHOCARDIOGRAM COMPLETE
AR max vel: 0.75 cm2
AV Area VTI: 0.74 cm2
AV Area mean vel: 0.71 cm2
AV Mean grad: 44 mmHg
AV Peak grad: 76.4 mmHg
Ao pk vel: 4.37 m/s
Area-P 1/2: 6.96 cm2
P 1/2 time: 383 msec
S' Lateral: 4.8 cm

## 2019-10-11 MED ORDER — PERFLUTREN LIPID MICROSPHERE
1.0000 mL | INTRAVENOUS | Status: AC | PRN
  Administered 2019-10-11: 1 mL via INTRAVENOUS

## 2019-10-17 ENCOUNTER — Other Ambulatory Visit: Payer: Medicare Other

## 2019-10-24 DIAGNOSIS — B20 Human immunodeficiency virus [HIV] disease: Secondary | ICD-10-CM | POA: Diagnosis not present

## 2019-10-24 DIAGNOSIS — Z0000 Encounter for general adult medical examination without abnormal findings: Secondary | ICD-10-CM | POA: Diagnosis not present

## 2019-10-24 DIAGNOSIS — I5022 Chronic systolic (congestive) heart failure: Secondary | ICD-10-CM | POA: Diagnosis not present

## 2019-10-24 DIAGNOSIS — J449 Chronic obstructive pulmonary disease, unspecified: Secondary | ICD-10-CM | POA: Diagnosis not present

## 2019-10-26 ENCOUNTER — Ambulatory Visit: Payer: Medicare Other | Admitting: Podiatry

## 2019-10-26 ENCOUNTER — Encounter: Payer: Self-pay | Admitting: Podiatry

## 2019-10-26 ENCOUNTER — Other Ambulatory Visit: Payer: Self-pay

## 2019-10-26 VITALS — Temp 97.2°F

## 2019-10-26 DIAGNOSIS — F3181 Bipolar II disorder: Secondary | ICD-10-CM | POA: Insufficient documentation

## 2019-10-26 DIAGNOSIS — D689 Coagulation defect, unspecified: Secondary | ICD-10-CM | POA: Diagnosis not present

## 2019-10-26 DIAGNOSIS — J449 Chronic obstructive pulmonary disease, unspecified: Secondary | ICD-10-CM | POA: Insufficient documentation

## 2019-10-26 DIAGNOSIS — G8929 Other chronic pain: Secondary | ICD-10-CM | POA: Insufficient documentation

## 2019-10-26 DIAGNOSIS — Q828 Other specified congenital malformations of skin: Secondary | ICD-10-CM | POA: Diagnosis not present

## 2019-10-26 DIAGNOSIS — J309 Allergic rhinitis, unspecified: Secondary | ICD-10-CM | POA: Insufficient documentation

## 2019-10-26 DIAGNOSIS — I252 Old myocardial infarction: Secondary | ICD-10-CM | POA: Insufficient documentation

## 2019-10-26 DIAGNOSIS — A630 Anogenital (venereal) warts: Secondary | ICD-10-CM | POA: Insufficient documentation

## 2019-10-26 DIAGNOSIS — F17200 Nicotine dependence, unspecified, uncomplicated: Secondary | ICD-10-CM | POA: Insufficient documentation

## 2019-10-26 DIAGNOSIS — M549 Dorsalgia, unspecified: Secondary | ICD-10-CM | POA: Insufficient documentation

## 2019-10-26 DIAGNOSIS — I5022 Chronic systolic (congestive) heart failure: Secondary | ICD-10-CM | POA: Insufficient documentation

## 2019-10-26 DIAGNOSIS — E782 Mixed hyperlipidemia: Secondary | ICD-10-CM | POA: Insufficient documentation

## 2019-10-27 NOTE — Progress Notes (Signed)
Subjective:   Patient ID: [name] Smith, male   DOB: 59 y.o.   MRN: [id number]   HPI Patient states he has lesions on the bottom of both feet that are very painful and make walking difficult.  States he tries to trim them himself and get pedicures but is not effective and he does have family history of this.  Patient does not smoke likes to be active   Review of Systems  All other systems reviewed and are negative.       Objective:  Physical Exam Vitals and nursing note reviewed.  Constitutional:      Appearance: He is well-developed.  Pulmonary:     Effort: Pulmonary effort is normal.  Musculoskeletal:        General: Normal range of motion.  Skin:    General: Skin is warm.  Neurological:     Mental Status: He is alert.     Neurovascular status was found to be within normal limits with patient noted to have good digital perfusion well oriented x3 and is noted to have significant lesions x8 on the plantar aspect right foot that have loosened quarters very thick and painful with pressure.     Assessment:  Patient with relative poor health history with severe keratotic lesions plantar right that are painful     Plan:  H&P and debrided out all 8 lesions on the right with no iatrogenic bleeding and reappoint for routine care of these as needed

## 2019-11-07 ENCOUNTER — Telehealth: Payer: Self-pay | Admitting: Cardiovascular Disease

## 2019-11-07 ENCOUNTER — Encounter: Payer: Medicare Other | Admitting: Internal Medicine

## 2019-11-07 NOTE — Telephone Encounter (Signed)
Left message for patient to call back  

## 2019-11-07 NOTE — Telephone Encounter (Signed)
Patient returning call for CT results. 

## 2019-11-09 NOTE — Telephone Encounter (Signed)
Patient called back. Patient aware of results. 

## 2019-11-21 ENCOUNTER — Ambulatory Visit: Payer: Medicare Other | Admitting: Podiatry

## 2019-12-08 ENCOUNTER — Telehealth: Payer: Self-pay | Admitting: Cardiovascular Disease

## 2019-12-08 ENCOUNTER — Other Ambulatory Visit: Payer: Self-pay | Admitting: Internal Medicine

## 2019-12-08 DIAGNOSIS — I10 Essential (primary) hypertension: Secondary | ICD-10-CM

## 2019-12-08 MED ORDER — METOPROLOL SUCCINATE ER 50 MG PO TB24: 50.0000 mg | ORAL_TABLET | Freq: Every day | ORAL | 3 refills | Status: AC

## 2019-12-08 NOTE — Telephone Encounter (Signed)
 *  STAT* If patient is at the pharmacy, call can be transferred to refill team.   1. Which medications need to be refilled? (please list name of each medication and dose if known)  metoprolol succinate (TOPROL-XL) 50 MG 24 hr tablet  2. Which pharmacy/location (including street and city if local pharmacy) is medication to be sent to? [id number] - [place], [place] - [address]  3. Do they need a 30 day or 90 day supply? 43   Dr. Judyann Munson, the patient's Infectious Disease Doctor would like the patient's Cardiologist to take over filling the medication. Their office has filled it in the past.

## 2019-12-08 NOTE — Telephone Encounter (Signed)
Dr. Judyann Munson, would like Dr. Eden Emms to start refilling pt's medication metoprolol. Would Dr. Eden Emms like to refill this medication or should it go to pt's PCP? Please address

## 2019-12-08 NOTE — Telephone Encounter (Signed)
Recently seen patient. Per AVS continue current medications.

## 2020-01-05 DIAGNOSIS — J449 Chronic obstructive pulmonary disease, unspecified: Secondary | ICD-10-CM | POA: Diagnosis not present

## 2020-01-05 DIAGNOSIS — J069 Acute upper respiratory infection, unspecified: Secondary | ICD-10-CM | POA: Diagnosis not present

## 2020-01-05 DIAGNOSIS — B20 Human immunodeficiency virus [HIV] disease: Secondary | ICD-10-CM | POA: Diagnosis not present

## 2020-01-05 DIAGNOSIS — B001 Herpesviral vesicular dermatitis: Secondary | ICD-10-CM | POA: Diagnosis not present

## 2020-01-15 ENCOUNTER — Other Ambulatory Visit: Payer: Self-pay | Admitting: Internal Medicine

## 2020-01-15 DIAGNOSIS — B20 Human immunodeficiency virus [HIV] disease: Secondary | ICD-10-CM

## 2020-01-23 ENCOUNTER — Ambulatory Visit: Payer: Medicare Other | Admitting: Podiatry

## 2020-01-27 ENCOUNTER — Telehealth: Payer: Self-pay | Admitting: Cardiovascular Disease

## 2020-01-27 NOTE — Telephone Encounter (Signed)
New Message:     They would like for Dr Eden Emms to sign this pt's Death Certificate please.

## 2020-01-27 NOTE — Telephone Encounter (Signed)
I can sign it on Monday in office

## 2020-01-27 NOTE — Telephone Encounter (Signed)
Called Lupita Leash with [facility]. She will drop paper off today for Dr. Eden Emms to sign.

## 2020-01-30 ENCOUNTER — Telehealth: Payer: Self-pay | Admitting: Cardiovascular Disease

## 2020-01-30 NOTE — Telephone Encounter (Signed)
Medical Records received signed D/C. [facility] was called for pick up.01/30/20. Place at the front desks for [place].

## 2020-02-08 DEATH — deceased

## 2020-02-11 ENCOUNTER — Other Ambulatory Visit: Payer: Self-pay | Admitting: Internal Medicine

## 2020-02-11 DIAGNOSIS — B20 Human immunodeficiency virus [HIV] disease: Secondary | ICD-10-CM

## 2020-05-07 ENCOUNTER — Ambulatory Visit: Payer: Medicare Other | Admitting: Cardiovascular Disease

## 2020-08-21 ENCOUNTER — Other Ambulatory Visit: Payer: Self-pay | Admitting: Infectious Diseases
